# Patient Record
Sex: Female | Born: 1935
Health system: Southern US, Community
[De-identification: ages and names within clinical notes are randomized; demographics above are authoritative.]

## PROBLEM LIST (undated history)

## (undated) DIAGNOSIS — E079 Disorder of thyroid, unspecified: Secondary | ICD-10-CM

## (undated) DIAGNOSIS — S4291XA Fracture of right shoulder girdle, part unspecified, initial encounter for closed fracture: Secondary | ICD-10-CM

## (undated) DIAGNOSIS — I10 Essential (primary) hypertension: Secondary | ICD-10-CM

---

## 2008-02-19 ENCOUNTER — Ambulatory Visit (HOSPITAL_BASED_OUTPATIENT_CLINIC_OR_DEPARTMENT_OTHER): Admission: RE | Admit: 2008-02-19 | Discharge: 2008-02-19 | Payer: Self-pay | Admitting: Internal Medicine

## 2013-02-23 ENCOUNTER — Emergency Department (HOSPITAL_BASED_OUTPATIENT_CLINIC_OR_DEPARTMENT_OTHER)
Admission: EM | Admit: 2013-02-23 | Discharge: 2013-02-23 | Disposition: A | Discharge status: Home / Self Care | Payer: Medicare Other | Attending: Emergency Medicine | Admitting: Emergency Medicine

## 2013-02-23 DIAGNOSIS — Z464 Encounter for fitting and adjustment of orthodontic device: Secondary | ICD-10-CM | POA: Insufficient documentation

## 2013-02-23 DIAGNOSIS — M542 Cervicalgia: Secondary | ICD-10-CM | POA: Insufficient documentation

## 2013-02-23 DIAGNOSIS — Z4789 Encounter for other orthopedic aftercare: Secondary | ICD-10-CM

## 2013-02-23 NOTE — ED Provider Notes (Signed)
CSN: [id number]     Arrival date & time 02/23/13  1746 History  This chart was scribed for Gwyneth Sprout, MD by Ardelia Mems and Joaquin Music, ED Scribes. This patient was seen in room Room/bed info not found and the patient's care was started at 6:03 PM.  No chief complaint on file.   The history is provided by the patient. No language interpreter was used.   HPI Comments: Stacy Huang is a 79 y.o. female who presents to the Emergency Department complaining of continued right arm pain after a recent fracture. Pt has a cast placed to her right lower arm, and state that she is having pain due to the cast being "too heavy and too tight". Pt has a sling round her neck which she states is causing her additional discomfort. She reports associated mild neck pain, She denies any other pain or symptoms.  No past medical history on file. No past surgical history on file. No family history on file.  History  Substance Use Topics  . Smoking status: Not on file  . Smokeless tobacco: Not on file  . Alcohol Use: Not on file   OB History   No data available     Review of Systems  HENT: Positive for neck pain.   Musculoskeletal:       Right arm pain.  All other systems reviewed and are negative.    Allergies  Review of patient's allergies indicates not on file.  Home Medications  No current outpatient prescriptions on file.  There were no vitals taken for this visit.  Physical Exam  Nursing note and vitals reviewed. Constitutional: She is oriented to person, place, and time. She appears well-developed and well-nourished. No distress.  HENT:  Head: Normocephalic and atraumatic.  Eyes: EOM are normal.  Neck: Neck supple. No tracheal deviation present.  Cardiovascular: Normal rate.   2+ radial pulse on the right. Capillary refill less than 2 seconds.  Pulmonary/Chest: Effort normal. No respiratory distress.  Neurological: She is alert and oriented to person, place,  and time.  Cast looks appropriately sized without any signs of neurovascular compromise.  Skin: Skin is warm and dry.  Psychiatric: She has a normal mood and affect. Her behavior is normal.    ED Course  Procedures  DIAGNOSTIC STUDIES:  COORDINATION OF CARE: 6:09 PM- Pt advised of plan for treatment. Pt verbalizes understanding and agreement with plan.  Labs Review Labs Reviewed - No data to display Imaging Review No results found.  MDM   1. Cast discomfort    Pt needed to be placed in a shoulder immobilizer with significant improval in discomfort.   I personally performed the services described in this documentation, which was scribed in my presence.  The recorded information has been reviewed and considered.    Gwyneth Sprout, MD 02/23/13 [id number]

## 2013-06-06 ENCOUNTER — Encounter (HOSPITAL_BASED_OUTPATIENT_CLINIC_OR_DEPARTMENT_OTHER): Payer: Self-pay | Admitting: Emergency Medicine

## 2013-06-06 ENCOUNTER — Emergency Department (HOSPITAL_BASED_OUTPATIENT_CLINIC_OR_DEPARTMENT_OTHER)
Admission: EM | Admit: 2013-06-06 | Discharge: 2013-06-06 | Disposition: A | Discharge status: Home / Self Care | Payer: Medicare Other | Attending: Emergency Medicine | Admitting: Emergency Medicine

## 2013-06-06 DIAGNOSIS — R5381 Other malaise: Secondary | ICD-10-CM | POA: Insufficient documentation

## 2013-06-06 DIAGNOSIS — E079 Disorder of thyroid, unspecified: Secondary | ICD-10-CM | POA: Insufficient documentation

## 2013-06-06 DIAGNOSIS — J069 Acute upper respiratory infection, unspecified: Secondary | ICD-10-CM

## 2013-06-06 DIAGNOSIS — Z8781 Personal history of (healed) traumatic fracture: Secondary | ICD-10-CM | POA: Insufficient documentation

## 2013-06-06 DIAGNOSIS — Z79899 Other long term (current) drug therapy: Secondary | ICD-10-CM | POA: Insufficient documentation

## 2013-06-06 DIAGNOSIS — H6120 Impacted cerumen, unspecified ear: Secondary | ICD-10-CM | POA: Insufficient documentation

## 2013-06-06 DIAGNOSIS — I10 Essential (primary) hypertension: Secondary | ICD-10-CM | POA: Insufficient documentation

## 2013-06-06 DIAGNOSIS — H9209 Otalgia, unspecified ear: Secondary | ICD-10-CM | POA: Insufficient documentation

## 2013-06-06 DIAGNOSIS — H6123 Impacted cerumen, bilateral: Secondary | ICD-10-CM

## 2013-06-06 DIAGNOSIS — R42 Dizziness and giddiness: Secondary | ICD-10-CM | POA: Insufficient documentation

## 2013-06-06 HISTORY — DX: Essential (primary) hypertension: I10

## 2013-06-06 HISTORY — DX: Disorder of thyroid, unspecified: E07.9

## 2013-06-06 HISTORY — DX: Fracture of right shoulder girdle, part unspecified, initial encounter for closed fracture: S42.91XA

## 2013-06-06 MED ORDER — DOCUSATE SODIUM 50 MG/5ML PO LIQD
ORAL | Status: AC
  Administered 2013-06-06: 13:00:00
  Filled 2013-06-06: qty 10

## 2013-06-06 MED ORDER — AZITHROMYCIN 250 MG PO TABS: ORAL_TABLET | ORAL | Status: DC

## 2013-06-06 NOTE — ED Provider Notes (Signed)
CSN: [id number]     Arrival date & time 06/06/13  1015 History   First MD Initiated Contact with Patient 06/06/13 1030     Chief Complaint  Patient presents with  . URI   (Consider location/radiation/quality/duration/timing/severity/associated sxs/prior Treatment) HPI Comments: Patient is a 79 year old female with history of hypertension and thyroid disease. She presents today with complaints of sinus congestion, ear discomfort, and dizziness that has been occurring intermittently over the past week. She states that this happens on occasion when her years become plugged up. She denies headaches, stiff neck, or high fever.  Patient is a 79 y.o. female presenting with URI. The history is provided by the patient.  URI Presenting symptoms: congestion, cough, ear pain and fatigue   Presenting symptoms: no fever   Severity:  Moderate Duration:  1 week Timing:  Constant Progression:  Worsening Chronicity:  New Relieved by:  Nothing Worsened by:  Nothing tried Ineffective treatments:  None tried   Past Medical History  Diagnosis Date  . Hypertension   . Thyroid disease   . Shoulder fracture, right    History reviewed. No pertinent past surgical history. No family history on file. History  Substance Use Topics  . Smoking status: Never Smoker   . Smokeless tobacco: Not on file  . Alcohol Use: No   OB History   Grav Para Term Preterm Abortions TAB SAB Ect Mult Living                 Review of Systems  Constitutional: Positive for fatigue. Negative for fever.  HENT: Positive for congestion and ear pain.   Respiratory: Positive for cough.   All other systems reviewed and are negative.    Allergies  Review of patient's allergies indicates no known allergies.  Home Medications   Current Outpatient Rx  Name  Route  Sig  Dispense  Refill  . Levothyroxine Sodium (SYNTHROID PO)   Oral   Take by mouth.         Marland Kitchen UNKNOWN TO PATIENT      Blood pressure med           BP 166/56  Pulse 85  Temp(Src) 98.5 F (36.9 C) (Oral)  Resp 18  Ht 5\' 2"  (1.575 m)  Wt 162 lb (73.483 kg)  BMI 29.62 kg/m2  SpO2 98% Physical Exam  Nursing note and vitals reviewed. Constitutional: She is oriented to person, place, and time. She appears well-developed and well-nourished. No distress.  HENT:  Head: Normocephalic and atraumatic.  Mouth/Throat: Oropharynx is clear and moist.  Bilateral TMs are unable to be visualized do to cerumen.  Neck: Normal range of motion. Neck supple.  Cardiovascular: Normal rate and regular rhythm.  Exam reveals no gallop and no friction rub.   No murmur heard. Pulmonary/Chest: Effort normal and breath sounds normal. No respiratory distress. She has no wheezes.  Abdominal: Soft. Bowel sounds are normal. She exhibits no distension. There is no tenderness.  Musculoskeletal: Normal range of motion.  Lymphadenopathy:    She has no cervical adenopathy.  Neurological: She is alert and oriented to person, place, and time.  Skin: Skin is warm and dry. She is not diaphoretic.    ED Course  Procedures (including critical care time) Labs Review Labs Reviewed - No data to display Imaging Review No results found.  EKG Interpretation   None       MDM  No diagnosis found. Patient is a 79 year old female presents with ear congestion and sinus congestion  for the past several days. She has bilateral cerumen impactions were irrigated and she is now feeling better. Chills reports sinus pressure and drainage for which I will prescribe an antibiotic. She is to followup if she develops any problems. She otherwise looks clinically well and her vital signs are stable. I do not feel as though any imaging or laboratory studies are indicated at this time.    Geoffery Lyons, MD 06/06/13 1239

## 2013-06-06 NOTE — ED Notes (Signed)
Patient c/o productive cough/runny nose/sinus congestion/earache/ /intermittentdizziness over the past week. Last tylenol take yesterday

## 2015-10-03 ENCOUNTER — Emergency Department (HOSPITAL_BASED_OUTPATIENT_CLINIC_OR_DEPARTMENT_OTHER)
Admission: EM | Admit: 2015-10-03 | Discharge: 2015-10-03 | Disposition: A | Discharge status: Home / Self Care | Payer: Medicare Other | Attending: Emergency Medicine | Admitting: Emergency Medicine

## 2015-10-03 ENCOUNTER — Encounter (HOSPITAL_BASED_OUTPATIENT_CLINIC_OR_DEPARTMENT_OTHER): Payer: Self-pay | Admitting: *Deleted

## 2015-10-03 ENCOUNTER — Emergency Department (HOSPITAL_BASED_OUTPATIENT_CLINIC_OR_DEPARTMENT_OTHER): Payer: Medicare Other

## 2015-10-03 DIAGNOSIS — Y998 Other external cause status: Secondary | ICD-10-CM | POA: Insufficient documentation

## 2015-10-03 DIAGNOSIS — S8992XA Unspecified injury of left lower leg, initial encounter: Secondary | ICD-10-CM | POA: Diagnosis present

## 2015-10-03 DIAGNOSIS — I10 Essential (primary) hypertension: Secondary | ICD-10-CM | POA: Diagnosis not present

## 2015-10-03 DIAGNOSIS — Y9289 Other specified places as the place of occurrence of the external cause: Secondary | ICD-10-CM | POA: Insufficient documentation

## 2015-10-03 DIAGNOSIS — S86212A Strain of muscle(s) and tendon(s) of anterior muscle group at lower leg level, left leg, initial encounter: Secondary | ICD-10-CM | POA: Diagnosis not present

## 2015-10-03 DIAGNOSIS — E079 Disorder of thyroid, unspecified: Secondary | ICD-10-CM | POA: Insufficient documentation

## 2015-10-03 DIAGNOSIS — S8392XA Sprain of unspecified site of left knee, initial encounter: Secondary | ICD-10-CM | POA: Insufficient documentation

## 2015-10-03 DIAGNOSIS — Y9389 Activity, other specified: Secondary | ICD-10-CM | POA: Insufficient documentation

## 2015-10-03 DIAGNOSIS — X501XXA Overexertion from prolonged static or awkward postures, initial encounter: Secondary | ICD-10-CM | POA: Insufficient documentation

## 2015-10-03 MED ORDER — LIDOCAINE 5 % EX PTCH: 1.0000 | MEDICATED_PATCH | CUTANEOUS | Status: DC

## 2015-10-03 NOTE — ED Notes (Signed)
She twisted her right knee while turning 3 days ago. Painful. She is walking with a cane which is not her norm.

## 2015-10-03 NOTE — ED Provider Notes (Addendum)
CSN: [id number]     Arrival date & time 10/03/15  1109 History   First MD Initiated Contact with Patient 10/03/15 1115     Chief Complaint  Patient presents with  . Knee Injury     (Consider location/radiation/quality/duration/timing/severity/associated sxs/prior Treatment) Patient is a 82 y.o. female presenting with knee pain. The history is provided by the patient.  Knee Pain Location:  Knee Time since incident:  3 days Injury: yes   Mechanism of injury comment:  She was putting on appear patent at its when her right knee twisted Knee location:  R knee Pain details:    Quality:  Aching and shooting   Radiates to:  R leg   Severity:  Moderate   Onset quality:  Sudden   Duration:  3 days   Timing:  Constant   Progression:  Worsening Chronicity:  New Prior injury to area:  No Relieved by:  Acetaminophen Worsened by:  Bearing weight Ineffective treatments:  None tried Associated symptoms: no back pain, no decreased ROM, no muscle weakness and no swelling   Risk factors comment:  Known arthritis   Past Medical History  Diagnosis Date  . Hypertension   . Thyroid disease   . Shoulder fracture, right    History reviewed. No pertinent past surgical history. No family history on file. Social History  Substance Use Topics  . Smoking status: Never Smoker   . Smokeless tobacco: None  . Alcohol Use: No   OB History    No data available     Review of Systems  Musculoskeletal: Negative for back pain.  All other systems reviewed and are negative.     Allergies  Review of patient's allergies indicates no known allergies.  Home Medications   Prior to Admission medications   Medication Sig Start Date End Date Taking? Authorizing Provider  azithromycin (ZITHROMAX Z-PAK) 250 MG tablet 2 po day one, then 1 daily x 4 days 06/06/13   Geoffery Lyons, MD  Levothyroxine Sodium (SYNTHROID PO) Take by mouth.    Historical Provider, MD  UNKNOWN TO PATIENT Blood pressure med     Historical Provider, MD   BP 159/93 mmHg  Pulse 72  Temp(Src) 98 F (36.7 C) (Oral)  Resp 16  Ht  (1.575 m)  Wt 132 lb (59.875 kg)  BMI 24.14 kg/m2  SpO2 100% Physical Exam  Constitutional: She is oriented to person, place, and time. She appears well-developed and well-nourished. No distress.  HENT:  Head: Normocephalic and atraumatic.  Cardiovascular: Normal rate.   Pulmonary/Chest: Effort normal.  Musculoskeletal: She exhibits tenderness.       Right knee: She exhibits normal range of motion, no swelling and no effusion. Tenderness found. Lateral joint line tenderness noted. No MCL and no LCL tenderness noted.       Legs: Neurological: She is alert and oriented to person, place, and time.  Skin: Skin is warm and dry.  Psychiatric: She has a normal mood and affect. Her behavior is normal.  Nursing note and vitals reviewed.   ED Course  Procedures (including critical care time) Labs Review Labs Reviewed - No data to display  Imaging Review Dg Knee Complete 4 Views Right  10/03/2015  CLINICAL DATA:  Twisted right knee 3 days ago. Right lateral knee pain. EXAM: RIGHT KNEE - COMPLETE 4+ VIEW COMPARISON:  None. FINDINGS: Degenerative changes within the right knee with joint space narrowing and spurring. Small joint effusion. No acute bony abnormality. Specifically, no fracture, subluxation, or  dislocation. Soft tissues are intact. IMPRESSION: Tricompartment degenerative changes. Small joint effusion. No acute bony abnormality. Electronically Signed   By: Charlett NoseKevin  Dover M.D.   On: 10/03/2015 12:01   I have personally reviewed and evaluated these images and lab results as part of my medical decision-making.   EKG Interpretation None      MDM   Final diagnoses:  Knee sprain and strain, left, initial encounter    Patient is a 82 year old female presenting today with knee pain that started on Friday when she twisted her knee and felt a pop. Full range of motion intact with  pain over the lateral meniscus without significant swelling present. Neurovascularly intact. Patient is able to ambulate but has to use a cane due to pain. She has been using Tylenol with some improvement. At this time will prescribe Lidoderm patches, Ace wrap and follow-up with ortho. Imaging showed tricompartment degeneration, small joint effusion without other acute issues.  Gwyneth SproutWhitney Yaakov Saindon, MD 10/03/15 1223  Gwyneth SproutWhitney Daven Pinckney, MD 10/03/15 1226

## 2019-11-10 ENCOUNTER — Other Ambulatory Visit: Payer: Self-pay

## 2019-11-10 ENCOUNTER — Encounter (HOSPITAL_BASED_OUTPATIENT_CLINIC_OR_DEPARTMENT_OTHER): Payer: Self-pay | Admitting: *Deleted

## 2019-11-10 ENCOUNTER — Emergency Department (HOSPITAL_BASED_OUTPATIENT_CLINIC_OR_DEPARTMENT_OTHER)
Admission: EM | Admit: 2019-11-10 | Discharge: 2019-11-10 | Disposition: A | Discharge status: Home / Self Care | Payer: Medicare Other | Attending: Emergency Medicine | Admitting: Emergency Medicine

## 2019-11-10 DIAGNOSIS — H6123 Impacted cerumen, bilateral: Secondary | ICD-10-CM | POA: Insufficient documentation

## 2019-11-10 DIAGNOSIS — Z79899 Other long term (current) drug therapy: Secondary | ICD-10-CM | POA: Insufficient documentation

## 2019-11-10 DIAGNOSIS — E079 Disorder of thyroid, unspecified: Secondary | ICD-10-CM | POA: Insufficient documentation

## 2019-11-10 DIAGNOSIS — I10 Essential (primary) hypertension: Secondary | ICD-10-CM | POA: Diagnosis not present

## 2019-11-10 DIAGNOSIS — H9203 Otalgia, bilateral: Secondary | ICD-10-CM | POA: Diagnosis present

## 2019-11-10 MED ORDER — CARBAMIDE PEROXIDE 6.5 % OT SOLN: 5.0000 [drp] | Freq: Two times a day (BID) | OTIC | 0 refills | Status: DC

## 2019-11-10 MED FILL — DEBROX 6.5 % SOLN: 6.5 | 15 days supply | Qty: 15 | Fill #0

## 2019-11-10 NOTE — ED Notes (Signed)
ED Provider at bedside. 

## 2019-11-10 NOTE — ED Triage Notes (Addendum)
Headache and pain in both ears x 3 days. Her son states ear wax in her ears has caused her to become dizzy.

## 2019-11-10 NOTE — Discharge Instructions (Addendum)
Use Debrox as prescribed.  If this is too expensive see if there are any other over-the-counter medications that you can use.  You could always try 50-50 mix of water and hydrogen peroxide 1-2 times a day with irrigation of the ear with water shortly after using.  However, follow-up with your primary care doctor if still having some hearing issues.  They may be able to help out or refer you to ENT.

## 2019-11-10 NOTE — ED Provider Notes (Signed)
[facility] EMERGENCY DEPARTMENT Provider Note   CSN: [id number] Arrival date & time: 11/10/19  1131     History Chief Complaint  Patient presents with   Otalgia    Stacy Huang is a 86 y.o. female.  The history is provided by the patient.  Otalgia Location:  Bilateral Behind ear:  No abnormality Quality:  Aching Severity:  Mild Onset quality:  Gradual Duration:  3 days Timing:  Constant Progression:  Unchanged Chronicity:  New Context comment:  Ear wax Relieved by:  OTC medications Worsened by:  Nothing Associated symptoms: hearing loss (diminshed)   Associated symptoms: no abdominal pain, no congestion, no cough, no diarrhea, no ear discharge, no fever, no headaches, no neck pain, no rash, no rhinorrhea, no sore throat, no tinnitus and no vomiting        Past Medical History:  Diagnosis Date   Hypertension    Shoulder fracture, right    Thyroid disease     There are no problems to display for this patient.   History reviewed. No pertinent surgical history.   OB History   No obstetric history on file.     No family history on file.  Social History   Tobacco Use   Smoking status: Never Smoker   Smokeless tobacco: Never Used  Substance Use Topics   Alcohol use: No   Drug use: No    Home Medications Prior to Admission medications   Medication Sig Start Date End Date Taking? Authorizing Provider  Levothyroxine Sodium (SYNTHROID PO) Take by mouth.   Yes [provider]  azithromycin (ZITHROMAX Z-PAK) 250 MG tablet 2 po day one, then 1 daily x 4 days 06/06/13   Geoffery Lyons, MD  carbamide peroxide (DEBROX) 6.5 % OTIC solution Place 5 drops into both ears 2 (two) times daily. 11/10/19   Vedant Shehadeh, DO  lidocaine (LIDODERM) 5 % Place 1 patch onto the skin daily. Remove & Discard patch within 12 hours or as directed by MD 10/03/15   Gwyneth Sprout, MD  UNKNOWN TO PATIENT Blood pressure med    [provider]     Allergies    Patient has no known allergies.  Review of Systems   Review of Systems  Constitutional: Negative for fever.  HENT: Positive for ear pain and hearing loss (diminshed). Negative for congestion, dental problem, ear discharge, facial swelling, mouth sores, nosebleeds, postnasal drip, rhinorrhea, sinus pressure, sinus pain, sneezing, sore throat, tinnitus and trouble swallowing.   Respiratory: Negative for cough.   Gastrointestinal: Negative for abdominal pain, diarrhea and vomiting.  Musculoskeletal: Negative for neck pain.  Skin: Negative for rash.  Neurological: Positive for dizziness (chronic, intermittent, non currently). Negative for tremors, seizures, syncope, facial asymmetry, speech difficulty, weakness, light-headedness, numbness and headaches.    Physical Exam Updated Vital Signs  ED Triage Vitals  Enc Vitals Group     BP 11/10/19 1141 (!) 218/78     Pulse Rate 11/10/19 1141 78     Resp 11/10/19 1141 18     Temp 11/10/19 1141 98.7 F (37.1 C)     Temp Source 11/10/19 1141 Oral     SpO2 11/10/19 1141 100 %     Weight 11/10/19 1137 153 lb (69.4 kg)     Height 11/10/19 1137 5\' 2"  (1.575 m)     Head Circumference --      Peak Flow --      Pain Score 11/10/19 1137 3     Pain Loc --  Pain Edu? --      Excl. in GC? --     Physical Exam Vitals and nursing note reviewed.  Constitutional:      General: She is not in acute distress.    Appearance: She is well-developed. She is not ill-appearing.  HENT:     Head: Normocephalic and atraumatic.     Right Ear: There is impacted cerumen.     Left Ear: There is impacted cerumen.  Eyes:     Conjunctiva/sclera: Conjunctivae normal.  Cardiovascular:     Rate and Rhythm: Normal rate and regular rhythm.     Pulses: Normal pulses.     Heart sounds: Normal heart sounds. No murmur.  Pulmonary:     Effort: Pulmonary effort is normal. No respiratory distress.     Breath sounds: Normal breath sounds.  Abdominal:      Palpations: Abdomen is soft.     Tenderness: There is no abdominal tenderness.  Musculoskeletal:     Cervical back: Normal range of motion and neck supple.  Skin:    General: Skin is warm and dry.  Neurological:     General: No focal deficit present.     Mental Status: She is alert and oriented to person, place, and time.     Cranial Nerves: No cranial nerve deficit.     Sensory: No sensory deficit.     Motor: No weakness.     Coordination: Coordination normal.     Gait: Gait normal.     Comments: 5+ out of 5 strength throughout, normal sensation, no drift, normal speech, no visual field deficit, normal finger-to-nose finger     ED Results / Procedures / Treatments   Labs (all labs ordered are listed, but only abnormal results are displayed) Labs Reviewed - No data to display  EKG EKG Interpretation  Date/Time:  Tuesday November 10 2019 11:49:03 EDT Ventricular Rate:  66 PR Interval:    QRS Duration: 81 QT Interval:  435 QTC Calculation: 456 R Axis:   55 Text Interpretation: Sinus rhythm Nonspecific T abnormalities, lateral leads Confirmed by Virgina Norfolk [phone]) on 11/10/2019 11:53:29 AM   Radiology No results found.  Procedures .Ear Cerumen Removal  Date/Time: 11/10/2019 12:20 PM Performed by: Virgina Norfolk, DO Authorized by: Virgina Norfolk, DO   Consent:    Consent obtained:  Verbal   Consent given by:  Patient   Risks discussed:  Infection, incomplete removal, bleeding, dizziness, pain and TM perforation   Alternatives discussed:  No treatment Procedure details:    Location:  L ear and R ear   Procedure type: irrigation   Post-procedure details:    Inspection:  TM intact   Hearing quality:  Improved   Patient tolerance of procedure:  Tolerated well, no immediate complications   (including critical care time)  Medications Ordered in ED Medications - No data to display  ED Course  I have reviewed the triage vital signs and the nursing  notes.  Pertinent labs & imaging results that were available during my care of the patient were reviewed by me and considered in my medical decision making (see chart for details).    MDM Rules/Calculators/A&P                      Stacy Huang is a 86 year old female with history of hypertension who presents to the ED with ear pain.  Patient with overall unremarkable vitals except for elevated blood pressure.  Patient with bilateral ear discomfort  for the last several days.  Has been using over-the-counter sweet oil for her earwax.  She has diminished hearing.  Has some intermittent dizziness but that is chronic.  But does get worse when her ears get full of wax.  She has a normal neurological exam.  No concern for stroke or other intracranial process.  She has bilateral impaction of ears with wax.  I was able to irrigate out the ears with a 50-50 mix of water and hydrogen peroxide with some improvement of her hearing and some diminished wax.  TMs overall appear well.  Will prescribe Debrox and have her follow-up with her primary care doctor.  May need to see ENT which she has had in the past for ear washouts.  Discharged in ED in good condition.  Given return precautions.  This chart was dictated using voice recognition software.  Despite best efforts to proofread,  errors can occur which can change the documentation meaning.    Final Clinical Impression(s) / ED Diagnoses Final diagnoses:  Bilateral impacted cerumen    Rx / DC Orders ED Discharge Orders         Ordered    carbamide peroxide (DEBROX) 6.5 % OTIC solution  2 times daily     11/10/19 1224           Salima Rumer, DO 11/10/19 1226

## 2020-02-26 ENCOUNTER — Inpatient Hospital Stay (HOSPITAL_BASED_OUTPATIENT_CLINIC_OR_DEPARTMENT_OTHER)
Admission: EM | Admit: 2020-02-26 | Discharge: 2020-03-02 | DRG: 177 | Disposition: A | Discharge status: Skilled Nursing Facility | Payer: Medicare Other | Attending: Internal Medicine | Admitting: Internal Medicine

## 2020-02-26 ENCOUNTER — Encounter (HOSPITAL_BASED_OUTPATIENT_CLINIC_OR_DEPARTMENT_OTHER): Payer: Self-pay | Admitting: *Deleted

## 2020-02-26 ENCOUNTER — Emergency Department (HOSPITAL_BASED_OUTPATIENT_CLINIC_OR_DEPARTMENT_OTHER): Payer: Medicare Other

## 2020-02-26 ENCOUNTER — Other Ambulatory Visit: Payer: Self-pay

## 2020-02-26 DIAGNOSIS — I82453 Acute embolism and thrombosis of peroneal vein, bilateral: Secondary | ICD-10-CM | POA: Diagnosis present

## 2020-02-26 DIAGNOSIS — U071 COVID-19: Principal | ICD-10-CM | POA: Diagnosis present

## 2020-02-26 DIAGNOSIS — I160 Hypertensive urgency: Secondary | ICD-10-CM | POA: Diagnosis present

## 2020-02-26 DIAGNOSIS — I82441 Acute embolism and thrombosis of right tibial vein: Secondary | ICD-10-CM | POA: Diagnosis present

## 2020-02-26 DIAGNOSIS — I82811 Embolism and thrombosis of superficial veins of right lower extremities: Secondary | ICD-10-CM | POA: Diagnosis present

## 2020-02-26 DIAGNOSIS — R7989 Other specified abnormal findings of blood chemistry: Secondary | ICD-10-CM

## 2020-02-26 DIAGNOSIS — Z79890 Hormone replacement therapy (postmenopausal): Secondary | ICD-10-CM

## 2020-02-26 DIAGNOSIS — J1282 Pneumonia due to coronavirus disease 2019: Secondary | ICD-10-CM | POA: Diagnosis present

## 2020-02-26 DIAGNOSIS — J9601 Acute respiratory failure with hypoxia: Secondary | ICD-10-CM | POA: Diagnosis not present

## 2020-02-26 DIAGNOSIS — Z66 Do not resuscitate: Secondary | ICD-10-CM | POA: Diagnosis present

## 2020-02-26 DIAGNOSIS — R531 Weakness: Secondary | ICD-10-CM

## 2020-02-26 DIAGNOSIS — R5383 Other fatigue: Secondary | ICD-10-CM

## 2020-02-26 DIAGNOSIS — N183 Chronic kidney disease, stage 3 unspecified: Secondary | ICD-10-CM | POA: Diagnosis present

## 2020-02-26 DIAGNOSIS — E039 Hypothyroidism, unspecified: Secondary | ICD-10-CM | POA: Diagnosis present

## 2020-02-26 DIAGNOSIS — N1832 Chronic kidney disease, stage 3b: Secondary | ICD-10-CM

## 2020-02-26 DIAGNOSIS — I129 Hypertensive chronic kidney disease with stage 1 through stage 4 chronic kidney disease, or unspecified chronic kidney disease: Secondary | ICD-10-CM | POA: Diagnosis present

## 2020-02-26 LAB — FERRITIN: Ferritin: 468 ng/mL — ABNORMAL HIGH (ref 11–307)

## 2020-02-26 LAB — PROCALCITONIN: Procalcitonin: 0.13 ng/mL

## 2020-02-26 LAB — CBC WITH DIFFERENTIAL/PLATELET
Abs Immature Granulocytes: 0.61 10*3/uL — ABNORMAL HIGH (ref 0.00–0.07)
Basophils Absolute: 0.1 10*3/uL (ref 0.0–0.1)
Basophils Relative: 0 %
Eosinophils Absolute: 0.1 10*3/uL (ref 0.0–0.5)
Eosinophils Relative: 1 %
HCT: 38.9 % (ref 36.0–46.0)
Hemoglobin: 12.6 g/dL (ref 12.0–15.0)
Immature Granulocytes: 4 %
Lymphocytes Relative: 4 %
Lymphs Abs: 0.7 10*3/uL (ref 0.7–4.0)
MCH: 29.6 pg (ref 26.0–34.0)
MCHC: 32.4 g/dL (ref 30.0–36.0)
MCV: 91.5 fL (ref 80.0–100.0)
Monocytes Absolute: 0.8 10*3/uL (ref 0.1–1.0)
Monocytes Relative: 5 %
Neutro Abs: 14.2 10*3/uL — ABNORMAL HIGH (ref 1.7–7.7)
Neutrophils Relative %: 86 %
Platelets: 377 10*3/uL (ref 150–400)
RBC: 4.25 MIL/uL (ref 3.87–5.11)
RDW: 13.2 % (ref 11.5–15.5)
WBC: 16.4 10*3/uL — ABNORMAL HIGH (ref 4.0–10.5)
nRBC: 0 % (ref 0.0–0.2)

## 2020-02-26 LAB — COMPREHENSIVE METABOLIC PANEL
ALT: 13 U/L (ref 0–44)
AST: 20 U/L (ref 15–41)
Albumin: 2.9 g/dL — ABNORMAL LOW (ref 3.5–5.0)
Alkaline Phosphatase: 74 U/L (ref 38–126)
Anion gap: 13 (ref 5–15)
BUN: 25 mg/dL — ABNORMAL HIGH (ref 8–23)
CO2: 23 mmol/L (ref 22–32)
Calcium: 8.6 mg/dL — ABNORMAL LOW (ref 8.9–10.3)
Chloride: 103 mmol/L (ref 98–111)
Creatinine, Ser: 1.62 mg/dL — ABNORMAL HIGH (ref 0.44–1.00)
GFR calc Af Amer: 31 mL/min — ABNORMAL LOW (ref >60)
GFR calc non Af Amer: 26 mL/min — ABNORMAL LOW (ref >60)
Glucose, Bld: 111 mg/dL — ABNORMAL HIGH (ref 70–99)
Potassium: 3.8 mmol/L (ref 3.5–5.1)
Sodium: 139 mmol/L (ref 135–145)
Total Bilirubin: 0.6 mg/dL (ref 0.3–1.2)
Total Protein: 7.6 g/dL (ref 6.5–8.1)

## 2020-02-26 LAB — FIBRINOGEN: Fibrinogen: 518 mg/dL — ABNORMAL HIGH (ref 210–475)

## 2020-02-26 LAB — D-DIMER, QUANTITATIVE: D-Dimer, Quant: >20 ug/mL-FEU — ABNORMAL HIGH (ref 0.00–0.50)

## 2020-02-26 LAB — SARS CORONAVIRUS 2 BY RT PCR (HOSPITAL ORDER, PERFORMED IN ~~LOC~~ HOSPITAL LAB): SARS Coronavirus 2: POSITIVE — AB

## 2020-02-26 LAB — TRIGLYCERIDES: Triglycerides: 146 mg/dL (ref <150)

## 2020-02-26 LAB — LACTATE DEHYDROGENASE: LDH: 234 U/L — ABNORMAL HIGH (ref 98–192)

## 2020-02-26 LAB — LACTIC ACID, PLASMA: Lactic Acid, Venous: 1.3 mmol/L (ref 0.5–1.9)

## 2020-02-26 LAB — C-REACTIVE PROTEIN: CRP: 15.3 mg/dL — ABNORMAL HIGH (ref <1.0)

## 2020-02-26 MED ORDER — SODIUM CHLORIDE 0.9 % IV SOLN
100.0000 mg | Freq: Every day | INTRAVENOUS | Status: DC
  Administered 2020-02-27: 100 mg via INTRAVENOUS
  Filled 2020-02-26 (×2): qty 20

## 2020-02-26 MED ORDER — DEXAMETHASONE SODIUM PHOSPHATE 10 MG/ML IJ SOLN
10.0000 mg | Freq: Once | INTRAMUSCULAR | Status: AC
  Administered 2020-02-26: 10 mg via INTRAVENOUS
  Filled 2020-02-26: qty 1

## 2020-02-26 MED ORDER — SODIUM CHLORIDE 0.9 % IV SOLN
200.0000 mg | Freq: Once | INTRAVENOUS | Status: AC
  Administered 2020-02-26: 200 mg via INTRAVENOUS
  Filled 2020-02-26: qty 40

## 2020-02-26 NOTE — ED Provider Notes (Signed)
[facility] EMERGENCY DEPARTMENT Provider Note  CSN: [id number] Arrival date & time: 02/26/20 1445    History Chief Complaint  Patient presents with  . Weakness  . Dizziness    HPI  Stacy Huang is a 86 y.o. female who still lives relatively independently with family reports she has had 2 weeks of dry cough, general weakness and SOB with walking. She does not think she has had a fever. She gets dizzy at times. She also has told her family that her food tastes funny at times. She has been vaccinated for Covid.    Past Medical History:  Diagnosis Date  . Hypertension   . Shoulder fracture, right   . Thyroid disease     History reviewed. No pertinent surgical history.  No family history on file.  Social History   Tobacco Use  . Smoking status: Never Smoker  . Smokeless tobacco: Never Used  Substance Use Topics  . Alcohol use: No  . Drug use: No     Home Medications Prior to Admission medications   Medication Sig Start Date End Date Taking? Authorizing Provider  Levothyroxine Sodium (SYNTHROID PO) Take by mouth.   Yes [provider]  UNKNOWN TO PATIENT Blood pressure med   Yes [provider]  azithromycin (ZITHROMAX Z-PAK) 250 MG tablet 2 po day one, then 1 daily x 4 days 06/06/13   Geoffery Lyons, MD  carbamide peroxide (DEBROX) 6.5 % OTIC solution Place 5 drops into both ears 2 (two) times daily. 11/10/19   Curatolo, Adam, DO  lidocaine (LIDODERM) 5 % Place 1 patch onto the skin daily. Remove & Discard patch within 12 hours or as directed by MD 10/03/15   Gwyneth Sprout, MD     Allergies    Patient has no known allergies.   Review of Systems   Review of Systems A comprehensive review of systems was completed and negative except as noted in HPI.    Physical Exam BP (!) 194/69   Pulse 66   Temp 99.2 F (37.3 C) (Oral)   Resp (!) 26   Ht 5\' 2"  (1.575 m)   Wt 69.4 kg   SpO2 100%   BMI 27.98 kg/m   Physical  Exam Vitals and nursing note reviewed.  Constitutional:      Appearance: Normal appearance.  HENT:     Head: Normocephalic and atraumatic.     Nose: Nose normal.     Mouth/Throat:     Mouth: Mucous membranes are moist.  Eyes:     Extraocular Movements: Extraocular movements intact.     Conjunctiva/sclera: Conjunctivae normal.  Cardiovascular:     Rate and Rhythm: Normal rate.  Pulmonary:     Effort: Pulmonary effort is normal.     Breath sounds: Normal breath sounds.  Abdominal:     General: Abdomen is flat.     Palpations: Abdomen is soft.     Tenderness: There is no abdominal tenderness.  Musculoskeletal:        General: No swelling. Normal range of motion.     Cervical back: Neck supple.  Skin:    General: Skin is warm and dry.  Neurological:     General: No focal deficit present.     Mental Status: She is alert.  Psychiatric:        Mood and Affect: Mood normal.      ED Results / Procedures / Treatments   Labs (all labs ordered are listed, but only abnormal  results are displayed) Labs Reviewed  SARS CORONAVIRUS 2 BY RT PCR (HOSPITAL ORDER, PERFORMED IN [facility]) - Abnormal; Notable for the following components:      Result Value   SARS Coronavirus 2 POSITIVE (*)    All other components within normal limits  CBC WITH DIFFERENTIAL/PLATELET - Abnormal; Notable for the following components:   WBC 16.4 (*)    Neutro Abs 14.2 (*)    Abs Immature Granulocytes 0.61 (*)    All other components within normal limits  COMPREHENSIVE METABOLIC PANEL - Abnormal; Notable for the following components:   Glucose, Bld 111 (*)    BUN 25 (*)    Creatinine, Ser 1.62 (*)    Calcium 8.6 (*)    Albumin 2.9 (*)    GFR calc non Af Amer 26 (*)    GFR calc Af Amer 31 (*)    All other components within normal limits  FIBRINOGEN - Abnormal; Notable for the following components:   Fibrinogen 518 (*)    All other components within normal limits  CULTURE, BLOOD (ROUTINE  X 2)  CULTURE, BLOOD (ROUTINE X 2)  LACTIC ACID, PLASMA  PROCALCITONIN  LACTIC ACID, PLASMA  LACTATE DEHYDROGENASE  FERRITIN  TRIGLYCERIDES  C-REACTIVE PROTEIN  D-DIMER, QUANTITATIVE (NOT AT [facility])    EKG EKG Interpretation  Date/Time:  Friday February 26 2020 14:50:23 EDT Ventricular Rate:  88 PR Interval:  138 QRS Duration: 68 QT Interval:  384 QTC Calculation: 464 R Axis:   37 Text Interpretation: Normal sinus rhythm Anterior infarct , age undetermined Abnormal ECG No significant change since last tracing Confirmed by Susy Frizzle [phone]) on 02/26/2020 3:52:15 PM    Radiology DG Chest Portable 1 View  Result Date: 02/26/2020 CLINICAL DATA:  Weakness, oxygen saturation of 88% on room air EXAM: PORTABLE CHEST 1 VIEW COMPARISON:  CT 02/19/2008 FINDINGS: Mixed interstitial and more patchy basilar and peripheral airspace opacities bilaterally. No pneumothorax or visible effusion. The aorta is calcified. The remaining cardiomediastinal contours are unremarkable. No acute osseous or soft tissue abnormality. Likely geode formation of the left humeral head. Degenerative changes are present in the imaged spine and shoulders. Nasal cannula overlies the chest. IMPRESSION: Mixed interstitial and more patchy peripheral basilar and peripheral airspace opacities bilaterally. Findings are concerning for a multifocal infection including atypical infection including such as COVID 19. Otherwise, and mixed interstitial and alveolar edema could present similarly. Electronically Signed   By: Kreg Shropshire M.D.   On: 02/26/2020 15:19    Procedures .Critical Care Performed by: Pollyann Savoy, MD Authorized by: Pollyann Savoy, MD   Critical care provider statement:    Critical care time (minutes):  30   Critical care time was exclusive of:  Separately billable procedures and treating other patients   Critical care was time spent personally by me on the following activities:  Discussions  with consultants, evaluation of patient's response to treatment, examination of patient, ordering and performing treatments and interventions, ordering and review of laboratory studies, ordering and review of radiographic studies, pulse oximetry, re-evaluation of patient's condition, obtaining history from patient or surrogate and review of old charts    Medications Ordered in the ED Medications  dexamethasone (DECADRON) injection 10 mg (10 mg Intravenous Given 02/26/20 1804)     MDM Rules/Calculators/A&P MDM Patient noted to be hypoxic in triage, improved with 2L [place]. She has a CXR concerning for Covid despite being vaccinated. Will initiate Covid order panel.  ED Course  I  have reviewed the triage vital signs and the nursing notes.  Pertinent labs & imaging results that were available during my care of the patient were reviewed by me and considered in my medical decision making (see chart for details).  Clinical Course as of Feb 25 1853  Fri Feb 26, 2020  1646 CBC with mild leukocytosis.    [CS]  1706 CMP with mild increase in Cr from baseline 1.3 at most recent labs done in Epic.    [CS]  1755 Covid is confirmed positive. Given oxygen requirement, will discuss admission with hospitalist.    [CS]  1756 Stacy Huang was evaluated in Emergency Department on 02/26/2020 for the symptoms described in the history of present illness. She was evaluated in the context of the global COVID-19 pandemic, which necessitated consideration that the patient might be at risk for infection with the SARS-CoV-2 virus that causes COVID-19. Institutional protocols and algorithms that pertain to the evaluation of patients at risk for COVID-19 are in a state of rapid change based on information released by regulatory bodies including the CDC and federal and state organizations. These policies and algorithms were followed during the patient's care in the ED.     [CS]  1841 Spoke with Dr. Shawnie Pons, Hospitalist who  will admit the patient.    [CS]    Clinical Course User Index [CS] Pollyann Savoy, MD    Final Clinical Impression(s) / ED Diagnoses Final diagnoses:  COVID-19  Acute respiratory failure with hypoxia [facility])    Rx / DC Orders ED Discharge Orders    None       Pollyann Savoy, MD 02/26/20 [phone]

## 2020-02-26 NOTE — ED Notes (Signed)
Reports cough  And sob when she walks , she states her food  Taste funny at times

## 2020-02-26 NOTE — Progress Notes (Signed)
Patient ID: Stacy Huang, female   DOB: September 15, 1920, 86 y.o.   MRN: [id number] 86 year old with hypertension and thyroid disease 2-week history of not feeling well, loss of taste, cough.  Vaccinated for Covid.  Positive for Covid with abnormal x-ray findings, and hypoxia at 88% on room air.  She is currently satting 100% on 2 L.  She is hypertensive.  She has an elevated white count of 16.4.  A normal lactic acid 1.3 and acute kidney injury with a creatinine of 1.62.  She is accepted for transport to [facility] in a MedSurg bed.

## 2020-02-26 NOTE — ED Triage Notes (Signed)
Weakness and dizziness x 2 weeks.

## 2020-02-26 NOTE — ED Notes (Signed)
H/a x 2 weeks and dizzness x 1 month

## 2020-02-27 ENCOUNTER — Inpatient Hospital Stay (HOSPITAL_COMMUNITY): Payer: Medicare Other

## 2020-02-27 DIAGNOSIS — R531 Weakness: Secondary | ICD-10-CM

## 2020-02-27 DIAGNOSIS — I129 Hypertensive chronic kidney disease with stage 1 through stage 4 chronic kidney disease, or unspecified chronic kidney disease: Secondary | ICD-10-CM | POA: Diagnosis present

## 2020-02-27 DIAGNOSIS — J1282 Pneumonia due to coronavirus disease 2019: Secondary | ICD-10-CM | POA: Diagnosis present

## 2020-02-27 DIAGNOSIS — U071 COVID-19: Secondary | ICD-10-CM | POA: Diagnosis present

## 2020-02-27 DIAGNOSIS — J9601 Acute respiratory failure with hypoxia: Secondary | ICD-10-CM | POA: Diagnosis present

## 2020-02-27 DIAGNOSIS — E039 Hypothyroidism, unspecified: Secondary | ICD-10-CM | POA: Diagnosis present

## 2020-02-27 DIAGNOSIS — R7989 Other specified abnormal findings of blood chemistry: Secondary | ICD-10-CM

## 2020-02-27 DIAGNOSIS — N179 Acute kidney failure, unspecified: Secondary | ICD-10-CM | POA: Diagnosis not present

## 2020-02-27 DIAGNOSIS — I82811 Embolism and thrombosis of superficial veins of right lower extremities: Secondary | ICD-10-CM | POA: Diagnosis present

## 2020-02-27 DIAGNOSIS — N183 Chronic kidney disease, stage 3 unspecified: Secondary | ICD-10-CM | POA: Diagnosis present

## 2020-02-27 DIAGNOSIS — R5383 Other fatigue: Secondary | ICD-10-CM

## 2020-02-27 DIAGNOSIS — Z66 Do not resuscitate: Secondary | ICD-10-CM | POA: Diagnosis present

## 2020-02-27 DIAGNOSIS — I82441 Acute embolism and thrombosis of right tibial vein: Secondary | ICD-10-CM | POA: Diagnosis present

## 2020-02-27 DIAGNOSIS — I82453 Acute embolism and thrombosis of peroneal vein, bilateral: Secondary | ICD-10-CM | POA: Diagnosis present

## 2020-02-27 DIAGNOSIS — I160 Hypertensive urgency: Secondary | ICD-10-CM

## 2020-02-27 DIAGNOSIS — Z79890 Hormone replacement therapy (postmenopausal): Secondary | ICD-10-CM | POA: Diagnosis not present

## 2020-02-27 HISTORY — DX: Acute respiratory failure with hypoxia: J96.01

## 2020-02-27 HISTORY — DX: Hypertensive urgency: I16.0

## 2020-02-27 LAB — HEPARIN LEVEL (UNFRACTIONATED): Heparin Unfractionated: 0.51 IU/mL (ref 0.30–0.70)

## 2020-02-27 LAB — APTT: aPTT: 63 seconds — ABNORMAL HIGH (ref 24–36)

## 2020-02-27 LAB — PROTIME-INR
INR: 1.3 — ABNORMAL HIGH (ref 0.8–1.2)
Prothrombin Time: 15.3 seconds — ABNORMAL HIGH (ref 11.4–15.2)

## 2020-02-27 MED ORDER — ALBUTEROL SULFATE HFA 108 (90 BASE) MCG/ACT IN AERS
2.0000 | INHALATION_SPRAY | Freq: Four times a day (QID) | RESPIRATORY_TRACT | Status: DC
  Administered 2020-02-27 (×2): 2 via RESPIRATORY_TRACT
  Filled 2020-02-27: qty 6.7

## 2020-02-27 MED ORDER — SODIUM CHLORIDE 0.9 % IV SOLN: 200.0000 mg | Freq: Once | INTRAVENOUS | Status: DC

## 2020-02-27 MED ORDER — METHYLPREDNISOLONE SODIUM SUCC 40 MG IJ SOLR
0.5000 mg/kg | Freq: Two times a day (BID) | INTRAMUSCULAR | Status: AC
  Administered 2020-02-27 – 2020-03-01 (×6): 31.6 mg via INTRAVENOUS
  Filled 2020-02-27 (×6): qty 1

## 2020-02-27 MED ORDER — HYDRALAZINE HCL 20 MG/ML IJ SOLN
5.0000 mg | Freq: Once | INTRAMUSCULAR | Status: AC
  Administered 2020-02-27: 5 mg via INTRAVENOUS
  Filled 2020-02-27: qty 1

## 2020-02-27 MED ORDER — SODIUM CHLORIDE 0.9 % IV SOLN: 100.0000 mg | Freq: Every day | INTRAVENOUS | Status: DC

## 2020-02-27 MED ORDER — LABETALOL HCL 5 MG/ML IV SOLN
10.0000 mg | INTRAVENOUS | Status: DC | PRN
  Administered 2020-02-27 – 2020-02-29 (×2): 10 mg via INTRAVENOUS
  Filled 2020-02-27 (×2): qty 4

## 2020-02-27 MED ORDER — SODIUM CHLORIDE 0.9 % IV SOLN
100.0000 mg | Freq: Every day | INTRAVENOUS | Status: AC
  Administered 2020-02-28 – 2020-03-01 (×3): 100 mg via INTRAVENOUS
  Filled 2020-02-27 (×3): qty 20

## 2020-02-27 MED ORDER — ACETAMINOPHEN 325 MG PO TABS: 650.0000 mg | ORAL_TABLET | Freq: Four times a day (QID) | ORAL | Status: DC | PRN

## 2020-02-27 MED ORDER — GUAIFENESIN-DM 100-10 MG/5ML PO SYRP: 10.0000 mL | ORAL_SOLUTION | ORAL | Status: DC | PRN

## 2020-02-27 MED ORDER — SODIUM CHLORIDE 0.9 % IV SOLN: INTRAVENOUS | Status: AC

## 2020-02-27 MED ORDER — HEPARIN (PORCINE) 25000 UT/250ML-% IV SOLN
650.0000 [IU]/h | INTRAVENOUS | Status: DC
  Administered 2020-02-27: 800 [IU]/h via INTRAVENOUS
  Administered 2020-02-28: 650 [IU]/h via INTRAVENOUS
  Filled 2020-02-27 (×2): qty 250

## 2020-02-27 MED ORDER — SODIUM CHLORIDE 0.9 % IV SOLN
INTRAVENOUS | Status: DC | PRN
  Administered 2020-02-27: 250 mL via INTRAVENOUS

## 2020-02-27 MED ORDER — PREDNISONE 20 MG PO TABS
50.0000 mg | ORAL_TABLET | Freq: Every day | ORAL | Status: DC
  Administered 2020-03-01 – 2020-03-02 (×2): 50 mg via ORAL
  Filled 2020-02-27: qty 2

## 2020-02-27 MED ORDER — ALBUTEROL SULFATE HFA 108 (90 BASE) MCG/ACT IN AERS
2.0000 | INHALATION_SPRAY | Freq: Two times a day (BID) | RESPIRATORY_TRACT | Status: DC
  Administered 2020-02-28 – 2020-03-02 (×8): 2 via RESPIRATORY_TRACT

## 2020-02-27 MED ORDER — HEPARIN BOLUS VIA INFUSION
1600.0000 [IU] | INTRAVENOUS | Status: AC
  Administered 2020-02-27: 1600 [IU] via INTRAVENOUS
  Filled 2020-02-27: qty 1600

## 2020-02-27 MED ORDER — LEVOTHYROXINE SODIUM 25 MCG PO TABS
25.0000 ug | ORAL_TABLET | Freq: Every day | ORAL | Status: DC
  Administered 2020-02-28 – 2020-03-02 (×4): 25 ug via ORAL
  Filled 2020-02-27 (×4): qty 1

## 2020-02-27 NOTE — ED Notes (Signed)
Pt stood to use bedside commode, also ate oatmeal and juice for breakfast.

## 2020-02-27 NOTE — ED Notes (Signed)
Pt unsure of her daily medications

## 2020-02-27 NOTE — Progress Notes (Signed)
ANTICOAGULATION CONSULT NOTE  Pharmacy Consult for Heparin  Indication: suspected VTE  No Known Allergies  Patient Measurements: Height: 5\' 2"  (157.5 cm) Weight: 63.4 kg (139 lb 12.4 oz) IBW/kg (Calculated) : 50.1 Heparin Dosing Weight: TBW  Vital Signs: Temp: 97.9 F (36.6 C) (09/18 2015) Temp Source: Axillary (09/18 1725) BP: 185/72 (09/18 2015) Pulse Rate: 61 (09/18 2015)  Labs: Recent Labs    02/26/20 1620 02/27/20 1415 02/27/20 2244  HGB 12.6  --   --   HCT 38.9  --   --   PLT 377  --   --   APTT  --  63*  --   LABPROT  --  15.3*  --   INR  --  1.3*  --   HEPARINUNFRC  --   --  0.51  CREATININE 1.62*  --   --     Estimated Creatinine Clearance: 17.4 mL/min (A) (by C-G formula based on SCr of 1.62 mg/dL (H)).   Medical History: Past Medical History:  Diagnosis Date   Hypertension    Shoulder fracture, right    Thyroid disease     Medications:  Medications Prior to Admission  Medication Sig Dispense Refill Last Dose   amLODipine (NORVASC) 10 MG tablet Take 10 mg by mouth daily.   Past Month at Unknown time   losartan (COZAAR) 25 MG tablet Take 25 mg by mouth daily.   Past Month at Unknown time   meclizine (ANTIVERT) 25 MG tablet Take 25 mg by mouth daily as needed for dizziness.    Past Month at Unknown time   Scheduled:   albuterol  2 puff Inhalation Q6H   [START ON 02/28/2020] levothyroxine  25 mcg Oral Q0600   methylPREDNISolone (SOLU-MEDROL) injection  0.5 mg/kg Intravenous Q12H   Followed by   03/01/2020 ON 03/01/2020] predniSONE  50 mg Oral Daily   Infusions:   sodium chloride Stopped (02/27/20 1210)   sodium chloride 75 mL/hr at 02/27/20 1854   heparin 800 Units/hr (02/27/20 1700)   [START ON 02/28/2020] remdesivir 100 mg in NS 100 mL      Assessment: 89 yoF presented to [facility] on 9/17 with COVID-19 pneumonia, transferred to [facility] on 9/18.  She has an elevated D-dimer > 20, and suspected VTE.  Pharmacy is consulted to dose heparin  IV No PTA anticoagulation. Baseline labs: CBC: Hgb 12.6, Plt wnl, SCr 1.62, INR 1.3, aptt 63sec Preliminary dopplers + bilateral DVT  02/27/2020: -Initial heparin level therapeutic at 0.51 on 800 units/hr -No bleeding or complications reported by RN  Goal of Therapy:  Heparin level 0.3-0.7 units/ml Monitor platelets by anticoagulation protocol: Yes   Plan:  Continue heparin IV infusion at 800 units/hr Confirmatory heparin level in 8 hours  Daily heparin level and CBC F/U long-term heparin plans  02/29/2020 PharmD, BCPS Clinical Pharmacist [facility] main pharmacy [phone] 02/27/2020 11:26 PM

## 2020-02-27 NOTE — H&P (Signed)
History and Physical        Hospital Admission Note Date: 02/27/2020  Patient name: Stacy Huang Medical record number: [id number] Date of birth: 1921/01/07 Age: 86 y.o. Gender: female  PCP: Doreen Salvage, PA-C  Patient coming from: home Lives with: son At baseline, ambulates: walker  Chief Complaint    Chief Complaint  Patient presents with  . Weakness  . Dizziness      HPI:   This is a very pleasant 86 year old female who has been vaccinated against COVID-19 in April with a past medical history of hypothyroidism who presented to [facility] with complaints of dry cough, generalized weakness and dizziness x2 weeks.  With associated change in taste and bilateral lower extremity discomfort..  Denies any chest pain, fever, chills, night sweats, nausea or vomiting.  No other significant symptoms.  [facility] ED Course: Afebrile, tachypneic, hypertensive, 88% on room air requiring 2 L nasal cannula.  Notable labs: Glucose 111, BUN 25, creatinine 1.62 (unknown baseline) LDH 234, ferritin 468, CRP 15.3, procalcitonin 0.13, WBC 16.4, D-dimer > 20, fibrinogen 518, COVID-19 positive.  CXR with mixed interstitial more patchy peripheral basilar and peripheral airspace opacities bilaterally concerning for multifocal infection.  She was given dexamethasone and remdesivir and transferred to [facility] for further management of COVID-19.  Vitals:   02/27/20 1227 02/27/20 1358  BP: (!) 189/70 (!) 171/77  Pulse: 80 78  Resp: 20 20  Temp:    SpO2: 98% 97%     Review of Systems:  Review of Systems  Constitutional: Positive for malaise/fatigue. Negative for chills and fever.  HENT: Negative.   Eyes: Negative.   Respiratory: Positive for cough and shortness of breath. Negative for wheezing.   Cardiovascular: Negative for chest pain and palpitations.  Gastrointestinal: Negative for nausea and vomiting.    Genitourinary: Negative.   Musculoskeletal:       Bilateral ankle pain  Neurological: Positive for dizziness and headaches. Negative for focal weakness.  All other systems reviewed and are negative.   Medical/Social/Family History   Past Medical History: Past Medical History:  Diagnosis Date  . Hypertension   . Shoulder fracture, right   . Thyroid disease     History reviewed. No pertinent surgical history.  Medications: Prior to Admission medications   Medication Sig Start Date End Date Taking? Authorizing Provider  Levothyroxine Sodium (SYNTHROID PO) Take by mouth.   Yes [provider]  UNKNOWN TO PATIENT Blood pressure med   Yes [provider]  azithromycin (ZITHROMAX Z-PAK) 250 MG tablet 2 po day one, then 1 daily x 4 days 06/06/13   Geoffery Lyons, MD  carbamide peroxide (DEBROX) 6.5 % OTIC solution Place 5 drops into both ears 2 (two) times daily. 11/10/19   Curatolo, Adam, DO  lidocaine (LIDODERM) 5 % Place 1 patch onto the skin daily. Remove & Discard patch within 12 hours or as directed by MD 10/03/15   Gwyneth Sprout, MD    Allergies:  No Known Allergies  Social History:  reports that she has never smoked. She has never used smokeless tobacco. She reports that she does not drink alcohol and does not use drugs.  Family History: No family history on file.   Objective  Physical Exam: Blood pressure (!) 171/77, pulse 78, temperature 98.5 F (36.9 C), temperature source Oral, resp. rate 20, height 5\' 2"  (1.575 m), weight 63.4 kg, SpO2 97 %.  Physical Exam Vitals and nursing note reviewed.  Constitutional:      Appearance: Normal appearance.  HENT:     Head: Normocephalic and atraumatic.  Eyes:     Conjunctiva/sclera: Conjunctivae normal.  Cardiovascular:     Rate and Rhythm: Normal rate and regular rhythm.  Pulmonary:     Effort: Pulmonary effort is normal. No respiratory distress.  Abdominal:     General: Abdomen is flat.      Palpations: Abdomen is soft.  Musculoskeletal:     Comments: Trace edema up to ankles with slight distal calf tenderness to palpation  Skin:    Coloration: Skin is not jaundiced or pale.  Neurological:     Mental Status: She is alert. Mental status is at baseline.  Psychiatric:        Mood and Affect: Mood normal.        Behavior: Behavior normal.     LABS on Admission: I have personally reviewed all the labs and imaging below    Basic Metabolic Panel: Recent Labs  Lab 02/26/20 1620  NA 139  K 3.8  CL 103  CO2 23  GLUCOSE 111*  BUN 25*  CREATININE 1.62*  CALCIUM 8.6*   Liver Function Tests: Recent Labs  Lab 02/26/20 1620  AST 20  ALT 13  ALKPHOS 74  BILITOT 0.6  PROT 7.6  ALBUMIN 2.9*   No results for input(s): LIPASE, AMYLASE in the last 168 hours. No results for input(s): AMMONIA in the last 168 hours. CBC: Recent Labs  Lab 02/26/20 1620  WBC 16.4*  NEUTROABS 14.2*  HGB 12.6  HCT 38.9  MCV 91.5  PLT 377   Cardiac Enzymes: No results for input(s): CKTOTAL, CKMB, CKMBINDEX, TROPONINI in the last 168 hours. BNP: Invalid input(s): POCBNP CBG: No results for input(s): GLUCAP in the last 168 hours.  Radiological Exams on Admission:  DG Chest Portable 1 View  Result Date: 02/26/2020 CLINICAL DATA:  Weakness, oxygen saturation of 88% on room air EXAM: PORTABLE CHEST 1 VIEW COMPARISON:  CT 02/19/2008 FINDINGS: Mixed interstitial and more patchy basilar and peripheral airspace opacities bilaterally. No pneumothorax or visible effusion. The aorta is calcified. The remaining cardiomediastinal contours are unremarkable. No acute osseous or soft tissue abnormality. Likely geode formation of the left humeral head. Degenerative changes are present in the imaged spine and shoulders. Nasal cannula overlies the chest. IMPRESSION: Mixed interstitial and more patchy peripheral basilar and peripheral airspace opacities bilaterally. Findings are concerning for a multifocal  infection including atypical infection including such as COVID 19. Otherwise, and mixed interstitial and alveolar edema could present similarly. Electronically Signed   By: 04/20/2008 M.D.   On: 02/26/2020 15:19      EKG: Independently reviewed.    A & P   Principal Problem:   Acute hypoxemic respiratory failure due to COVID-19 [facility]) Active Problems:   AKI (acute kidney injury) (HCC)   Positive D dimer   Hypertensive urgency   Generalized weakness   1. Acute hypoxic respiratory failure secondary to COVID-19 a. Afebrile, requiring 2 L nasal cannula, WBC 16 b. Elevated inflammatory markers: CRP 15 c. D-dimer > 20 - unable to get CTA chest due to renal function, will empirically start heparin and check lower extremity doppler for DVT. If renal function improved tomorrow then consider CTA  chest.  d. Procalcitonin 0.13 -will hold off on antibiotics for now since afebrile e. Continue steroids and remdesivir f. Inhalers, incentive spirometry, flutter valve  2. AKI, likely from hemodynamic changes a. Cr 1.6, unknown baseline b. Continue treatment as outliined  3. Generalized weakness a. PT eval  4. Hypertensive urgency a. Not on antihypertensives at home b. Labetalol PRN  5. Hypothyroidism a. Check TSH b. Continue home levothyroxine   DVT prophylaxis: heparin   Code Status: DNR  Diet: regular Family Communication: Admission, patients condition and plan of care including tests being ordered have been discussed with the patient who indicates understanding and agrees with the plan and Code Status. Patient's son was updated  Disposition Plan: The appropriate patient status for this patient is INPATIENT. Inpatient status is judged to be reasonable and necessary in order to provide the required intensity of service to ensure the patient's safety. The patient's presenting symptoms, physical exam findings, and initial radiographic and laboratory data in the context of their chronic  comorbidities is felt to place them at high risk for further clinical deterioration. Furthermore, it is not anticipated that the patient will be medically stable for discharge from the hospital within 2 midnights of admission. The following factors support the patient status of inpatient.   " The patient's presenting symptoms include dizziness, shortness of breath. " The worrisome physical exam findings include hypoxia, leg swelling/pain. " The initial radiographic and laboratory data are worrisome because of elevated inflammatory markers, covid 19 positive. " The chronic co-morbidities include hypothyroidism.   * I certify that at the point of admission it is my clinical judgment that the patient will require inpatient hospital care spanning beyond 2 midnights from the point of admission due to high intensity of service, high risk for further deterioration and high frequency of surveillance required.*   Status is: Inpatient  Remains inpatient appropriate because:IV treatments appropriate due to intensity of illness or inability to take PO and Inpatient level of care appropriate due to severity of illness   Dispo: The patient is from: Home              Anticipated d/c is to: Home              Anticipated d/c date is: 2 days              Patient currently is not medically stable to d/c.      Consultants  . none  Procedures  . none  Time Spent on Admission: 67 minutes    Jae Dire, DO [facility] Pager [phone] 02/27/2020, 2:34 PM

## 2020-02-27 NOTE — ED Notes (Signed)
[facility] at bedside has all paperwork

## 2020-02-27 NOTE — Progress Notes (Signed)
Bilateral lower extremity venous duplex has been completed. Preliminary results can be found in CV Proc through chart review.  Results were given to the patient's nurse, Kim.  02/27/20 4:08 PM Olen Cordial RVT

## 2020-02-27 NOTE — Progress Notes (Signed)
ANTICOAGULATION CONSULT NOTE - Initial Consult  Pharmacy Consult for Heparin  Indication: suspected VTE  No Known Allergies  Patient Measurements: Height: 5\' 2"  (157.5 cm) Weight: 63.4 kg (139 lb 12.4 oz) IBW/kg (Calculated) : 50.1 Heparin Dosing Weight: TBW  Vital Signs: Temp: 98.5 F (36.9 C) (09/18 1227) Temp Source: Oral (09/18 1227) BP: 171/77 (09/18 1358) Pulse Rate: 78 (09/18 1358)  Labs: Recent Labs    02/26/20 1620  HGB 12.6  HCT 38.9  PLT 377  CREATININE 1.62*    Estimated Creatinine Clearance: 17.4 mL/min (A) (by C-G formula based on SCr of 1.62 mg/dL (H)).   Medical History: Past Medical History:  Diagnosis Date  . Hypertension   . Shoulder fracture, right   . Thyroid disease     Medications:  Medications Prior to Admission  Medication Sig Dispense Refill Last Dose  . Levothyroxine Sodium (SYNTHROID PO) Take by mouth.     02/28/20 UNKNOWN TO PATIENT Blood pressure med     . azithromycin (ZITHROMAX Z-PAK) 250 MG tablet 2 po day one, then 1 daily x 4 days 6 tablet 0   . carbamide peroxide (DEBROX) 6.5 % OTIC solution Place 5 drops into both ears 2 (two) times daily. 15 mL 0   . lidocaine (LIDODERM) 5 % Place 1 patch onto the skin daily. Remove & Discard patch within 12 hours or as directed by MD 30 patch 0    Scheduled:  . albuterol  2 puff Inhalation Q6H  . [START ON 02/28/2020] levothyroxine  25 mcg Oral Q0600  . methylPREDNISolone (SOLU-MEDROL) injection  0.5 mg/kg Intravenous Q12H   Followed by  . [START ON 03/01/2020] predniSONE  50 mg Oral Daily   Infusions:  . sodium chloride 250 mL (02/27/20 1121)    Assessment: 89 yoF [facility] with COVID-19 pneumonia, transferred to [facility] on 9/8.  She has an elevated D-dimer > 20, and suspected VTE.  Pharmacy is consulted to dose heparin IV No PTA anticoagulation. CBC: Hgb 12.6, Plt wnl SCr 1.62 Baseline coags pending No bleeding or complications reported  Goal of Therapy:  Heparin level  0.3-0.7 units/ml Monitor platelets by anticoagulation protocol: Yes   Plan:  [place] dosing nomogram used Give heparin 1600 units bolus IV x 1 Start heparin IV infusion at 800 units/hr Heparin level 8 hours after starting Daily heparin level and CBC   11/8 PharmD, BCPS Clinical Pharmacist [facility] main pharmacy [phone] 02/27/2020 2:14 PM

## 2020-02-27 NOTE — ED Notes (Signed)
This RN spoke with pt Son Stephannie Peters and update phone number in the chart to [phone], family made aware of bed assignment at [facility]

## 2020-02-28 LAB — COMPREHENSIVE METABOLIC PANEL
ALT: 13 U/L (ref 0–44)
AST: 16 U/L (ref 15–41)
Albumin: 2.6 g/dL — ABNORMAL LOW (ref 3.5–5.0)
Alkaline Phosphatase: 70 U/L (ref 38–126)
Anion gap: 11 (ref 5–15)
BUN: 39 mg/dL — ABNORMAL HIGH (ref 8–23)
CO2: 21 mmol/L — ABNORMAL LOW (ref 22–32)
Calcium: 8.3 mg/dL — ABNORMAL LOW (ref 8.9–10.3)
Chloride: 110 mmol/L (ref 98–111)
Creatinine, Ser: 1.52 mg/dL — ABNORMAL HIGH (ref 0.44–1.00)
GFR calc Af Amer: 33 mL/min — ABNORMAL LOW (ref >60)
GFR calc non Af Amer: 28 mL/min — ABNORMAL LOW (ref >60)
Glucose, Bld: 169 mg/dL — ABNORMAL HIGH (ref 70–99)
Potassium: 4 mmol/L (ref 3.5–5.1)
Sodium: 142 mmol/L (ref 135–145)
Total Bilirubin: 0.4 mg/dL (ref 0.3–1.2)
Total Protein: 6.4 g/dL — ABNORMAL LOW (ref 6.5–8.1)

## 2020-02-28 LAB — D-DIMER, QUANTITATIVE: D-Dimer, Quant: >20 ug/mL-FEU — ABNORMAL HIGH (ref 0.00–0.50)

## 2020-02-28 LAB — CBC WITH DIFFERENTIAL/PLATELET
Abs Immature Granulocytes: 0.58 10*3/uL — ABNORMAL HIGH (ref 0.00–0.07)
Basophils Absolute: 0 10*3/uL (ref 0.0–0.1)
Basophils Relative: 0 %
Eosinophils Absolute: 0 10*3/uL (ref 0.0–0.5)
Eosinophils Relative: 0 %
HCT: 35.9 % — ABNORMAL LOW (ref 36.0–46.0)
Hemoglobin: 11.6 g/dL — ABNORMAL LOW (ref 12.0–15.0)
Immature Granulocytes: 4 %
Lymphocytes Relative: 4 %
Lymphs Abs: 0.7 10*3/uL (ref 0.7–4.0)
MCH: 30.3 pg (ref 26.0–34.0)
MCHC: 32.3 g/dL (ref 30.0–36.0)
MCV: 93.7 fL (ref 80.0–100.0)
Monocytes Absolute: 0.6 10*3/uL (ref 0.1–1.0)
Monocytes Relative: 4 %
Neutro Abs: 13.5 10*3/uL — ABNORMAL HIGH (ref 1.7–7.7)
Neutrophils Relative %: 88 %
Platelets: 338 10*3/uL (ref 150–400)
RBC: 3.83 MIL/uL — ABNORMAL LOW (ref 3.87–5.11)
RDW: 13.3 % (ref 11.5–15.5)
WBC: 15.3 10*3/uL — ABNORMAL HIGH (ref 4.0–10.5)
nRBC: 0 % (ref 0.0–0.2)

## 2020-02-28 LAB — C-REACTIVE PROTEIN: CRP: 9.8 mg/dL — ABNORMAL HIGH (ref <1.0)

## 2020-02-28 LAB — MAGNESIUM: Magnesium: 2.3 mg/dL (ref 1.7–2.4)

## 2020-02-28 LAB — HEPARIN LEVEL (UNFRACTIONATED)
Heparin Unfractionated: 0.82 IU/mL — ABNORMAL HIGH (ref 0.30–0.70)
Heparin Unfractionated: 0.56 IU/mL (ref 0.30–0.70)

## 2020-02-28 LAB — FERRITIN: Ferritin: 467 ng/mL — ABNORMAL HIGH (ref 11–307)

## 2020-02-28 NOTE — Progress Notes (Signed)
      The patient complained about having pressure in her stomach, the nurse noticed that she didn't urinate during the night, the tech bladder scanned the patient and she has 396 cc in her bladder, the nurse paged the provider about the situation and that I was going to straight cath her due to her being in pain,  I straight cath her and got 250 ml out, the patient stated that she felt better.

## 2020-02-28 NOTE — Progress Notes (Signed)
ANTICOAGULATION CONSULT NOTE  Pharmacy Consult for Heparin  Indication: suspected VTE  No Known Allergies  Patient Measurements: Height: 5\' 2"  (157.5 cm) Weight: 63.4 kg (139 lb 12.4 oz) IBW/kg (Calculated) : 50.1 Heparin Dosing Weight: TBW  Vital Signs: Temp: 97.8 F (36.6 C) (09/19 1338) Temp Source: Oral (09/19 1338) BP: 157/91 (09/19 1556) Pulse Rate: 60 (09/19 1556)  Labs: Recent Labs    02/26/20 1620 02/27/20 1415 02/27/20 2244 02/28/20 0549 02/28/20 1614  HGB 12.6  --   --  11.6*  --   HCT 38.9  --   --  35.9*  --   PLT 377  --   --  338  --   APTT  --  63*  --   --   --   LABPROT  --  15.3*  --   --   --   INR  --  1.3*  --   --   --   HEPARINUNFRC  --   --  0.51 0.82* 0.56  CREATININE 1.62*  --   --  1.52*  --     Estimated Creatinine Clearance: 18.5 mL/min (A) (by C-G formula based on SCr of 1.52 mg/dL (H)).   Medical History: Past Medical History:  Diagnosis Date  . Hypertension   . Shoulder fracture, right   . Thyroid disease     Medications:  Medications Prior to Admission  Medication Sig Dispense Refill Last Dose  . amLODipine (NORVASC) 10 MG tablet Take 10 mg by mouth daily.   Past Month at Unknown time  . losartan (COZAAR) 25 MG tablet Take 25 mg by mouth daily.   Past Month at Unknown time  . meclizine (ANTIVERT) 25 MG tablet Take 25 mg by mouth daily as needed for dizziness.    Past Month at Unknown time   Scheduled:  . albuterol  2 puff Inhalation BID  . levothyroxine  25 mcg Oral Q0600  . methylPREDNISolone (SOLU-MEDROL) injection  0.5 mg/kg Intravenous Q12H   Followed by  . [START ON 03/01/2020] predniSONE  50 mg Oral Daily   Infusions:  . sodium chloride 10 mL/hr at 02/28/20 0839  . heparin 650 Units/hr (02/28/20 1500)  . remdesivir 100 mg in NS 100 mL Stopped (02/28/20 1111)    Assessment: 89 yoF presented to [facility] on 9/17 with COVID-19 pneumonia, transferred to [facility] on 9/18.  She has an elevated D-dimer > 20, and suspected VTE.   Pharmacy is consulted to dose heparin IV No PTA anticoagulation. Baseline labs: CBC: Hgb 12.6, Plt wnl, SCr 1.62, INR 1.3, aptt 63sec Preliminary dopplers + bilateral DVT  02/28/2020: -Heparin level now SUPRAtherapeutic at 0.82 on 800 units/hr -No bleeding or complications reported by RN -1614 HL 0.56, therapeutic on 650 units/hr  Goal of Therapy:  Heparin level 0.3-0.7 units/ml Monitor platelets by anticoagulation protocol: Yes   Plan:  continue heparin IV infusion at 650 units/hr Check heparin level with daily labs Daily  CBC F/U long-term heparin plans  03/01/2020 RPh 02/28/2020, 5:24 PM

## 2020-02-28 NOTE — Progress Notes (Signed)
PROGRESS NOTE    Stacy Huang  NWG:[id number] DOB: [birth year] DOA: 02/26/2020 PCP: Doreen Salvage, PA-C    Brief Narrative:  86 year old female with history of hypothyroidism presented to ER with dry cough, generalized weakness and dizziness for 2 weeks.  Also complaining about bilateral lower extremity discomfort. She is vaccinated against COVID-19 In the emergency room, afebrile tachypneic and hypertensive.  88% room air requiring 2 L nasal cannula.  CRP 15.  D-dimer more than 20.  COVID-19 positive.  Chest x-ray with bilateral interstitial infiltrates.   Assessment & Plan:   Principal Problem:   Acute hypoxemic respiratory failure due to COVID-19 [facility]) Active Problems:   AKI (acute kidney injury) (HCC)   Positive D dimer   Hypertensive urgency   Generalized weakness  COVID-19 pneumonia with hypoxemia: Continue to monitor due to significant symptoms  chest physiotherapy, incentive spirometry, deep breathing exercises, sputum induction, mucolytic's and bronchodilators. Supplemental oxygen to keep saturations more than 90%. Covid directed therapy with , steroids, on Solu-Medrol remdesivir, day 3/5 Due to severity of symptoms, patient will need daily inflammatory markers, chest x-rays, liver function test to monitor and direct COVID-19 therapies.  COVID-19 Labs  Recent Labs    02/26/20 1620 02/26/20 1621 02/28/20 0549  DDIMER  --  >20.00* >20.00*  FERRITIN 468*  --  467*  LDH 234*  --   --   CRP 15.3*  --  9.8*    Lab Results  Component Value Date   SARSCOV2NAA POSITIVE (A) 02/26/2020   SpO2: 97 % O2 Flow Rate (L/min): 2 L/min  Abnormal kidney function: Available previous kidney function test from care everywhere with creatinine 0.99 in 10/2015.  Probably has progressive kidney disease.  Remains fairly stable.  Hypothyroidism: On Synthroid.  Continue home doses.  Acute DVT right posterior tibial veins, right peroneal vein/acute DVT left peroneal veins: With  D-dimer more than 20.  On heparin.  Will treat with 3 to 6 months of anticoagulation. We will keep on heparin infusion until respiratory function stabilizes and will subsequently changed to NOAC. May have pulmonary embolism, will not subject patient to contrast dye as it would not change management.  We will anticoagulate.  DVT prophylaxis: Heparin infusion.   Code Status: DNR Family Communication: We will call family Disposition Plan: Status is: Inpatient  Remains inpatient appropriate because:Inpatient level of care appropriate due to severity of illness   Dispo: The patient is from: Home              Anticipated d/c is to: Home              Anticipated d/c date is: 3 days              Patient currently is not medically stable to d/c.         Consultants:   None  Procedures:   None  Antimicrobials:  Anti-infectives (From admission, onward)   Start     Dose/Rate Route Frequency Ordered Stop   02/28/20 1000  remdesivir 100 mg in sodium chloride 0.9 % 100 mL IVPB  Status:  Discontinued       "Followed by" Linked Group Details   100 mg 200 mL/hr over 30 Minutes Intravenous Daily 02/27/20 1400 02/27/20 1403   02/28/20 1000  remdesivir 100 mg in sodium chloride 0.9 % 100 mL IVPB        100 mg 200 mL/hr over 30 Minutes Intravenous Daily 02/27/20 1423 03/02/20 0959   02/27/20 1400  remdesivir 200  mg in sodium chloride 0.9% 250 mL IVPB  Status:  Discontinued       "Followed by" Linked Group Details   200 mg 580 mL/hr over 30 Minutes Intravenous Once 02/27/20 1400 02/27/20 1403   02/27/20 1000  remdesivir 100 mg in sodium chloride 0.9 % 100 mL IVPB  Status:  Discontinued       "Followed by" Linked Group Details   100 mg 200 mL/hr over 30 Minutes Intravenous Daily 02/26/20 1924 02/27/20 1401   02/26/20 2000  remdesivir 200 mg in sodium chloride 0.9% 250 mL IVPB       "Followed by" Linked Group Details   200 mg 580 mL/hr over 30 Minutes Intravenous Once 02/26/20 1924  02/26/20 2020         Subjective: Patient seen and examined.  No overnight events.  Denies any shortness of breath.  Poor historian.  Her main complaint is right knee and calf pain.  Not mobilized in the hospital.  Objective: Vitals:   02/27/20 1725 02/27/20 2015 02/28/20 0112 02/28/20 0525  BP: (!) 173/81 (!) 185/72 (!) 166/76 (!) 172/79  Pulse: 81 61 (!) 58 (!) 58  Resp: (!) 22 (!) 23 (!) 23 20  Temp: 97.7 F (36.5 C) 97.9 F (36.6 C) 98.9 F (37.2 C) 98.2 F (36.8 C)  TempSrc: Axillary     SpO2: 92% 97% 97% 97%  Weight:      Height:        Intake/Output Summary (Last 24 hours) at 02/28/2020 1203 Last data filed at 02/28/2020 1113 Gross per 24 hour  Intake 2171.72 ml  Output 0 ml  Net 2171.72 ml   Filed Weights   02/26/20 1451 02/27/20 1227  Weight: 69.4 kg 63.4 kg    Examination:  General exam: Appears calm and comfortable, appropriate for age.  On 2 L oxygen. Respiratory system: Clear to auscultation. Respiratory effort normal.  No added sounds. Cardiovascular system: S1 & S2 heard, RRR. No JVD, murmurs, rubs, gallops or clicks.  Bilateral trace pedal edema, some tenderness along the right posterior calf. Gastrointestinal system: Abdomen is nondistended, soft and nontender. No organomegaly or masses felt. Normal bowel sounds heard. Central nervous system: Alert and oriented. No focal neurological deficits. Extremities: Symmetric 5 x 5 power. Skin: No rashes, lesions or ulcers Psychiatry: Judgement and insight appear normal. Mood & affect appropriate.     Data Reviewed: I have personally reviewed following labs and imaging studies  CBC: Recent Labs  Lab 02/26/20 1620 02/28/20 0549  WBC 16.4* 15.3*  NEUTROABS 14.2* 13.5*  HGB 12.6 11.6*  HCT 38.9 35.9*  MCV 91.5 93.7  PLT 377 338   Basic Metabolic Panel: Recent Labs  Lab 02/26/20 1620 02/28/20 0549  NA 139 142  K 3.8 4.0  CL 103 110  CO2 23 21*  GLUCOSE 111* 169*  BUN 25* 39*  CREATININE  1.62* 1.52*  CALCIUM 8.6* 8.3*  MG  --  2.3   GFR: Estimated Creatinine Clearance: 18.5 mL/min (A) (by C-G formula based on SCr of 1.52 mg/dL (H)). Liver Function Tests: Recent Labs  Lab 02/26/20 1620 02/28/20 0549  AST 20 16  ALT 13 13  ALKPHOS 74 70  BILITOT 0.6 0.4  PROT 7.6 6.4*  ALBUMIN 2.9* 2.6*   No results for input(s): LIPASE, AMYLASE in the last 168 hours. No results for input(s): AMMONIA in the last 168 hours. Coagulation Profile: Recent Labs  Lab 02/27/20 1415  INR 1.3*   Cardiac Enzymes: No results  for input(s): CKTOTAL, CKMB, CKMBINDEX, TROPONINI in the last 168 hours. BNP (last 3 results) No results for input(s): PROBNP in the last 8760 hours. HbA1C: No results for input(s): HGBA1C in the last 72 hours. CBG: No results for input(s): GLUCAP in the last 168 hours. Lipid Profile: Recent Labs    02/26/20 1620  TRIG 146   Thyroid Function Tests: No results for input(s): TSH, T4TOTAL, FREET4, T3FREE, THYROIDAB in the last 72 hours. Anemia Panel: Recent Labs    02/26/20 1620 02/28/20 0549  FERRITIN 468* 467*   Sepsis Labs: Recent Labs  Lab 02/26/20 1620  PROCALCITON 0.13  LATICACIDVEN 1.3    Recent Results (from the past 240 hour(s))  SARS Coronavirus 2 by RT PCR (hospital order, performed in [facility] hospital lab) Nasopharyngeal Nasopharyngeal Swab     Status: Abnormal   Collection Time: 02/26/20  4:20 PM   Specimen: Nasopharyngeal Swab  Result Value Ref Range Status   SARS Coronavirus 2 POSITIVE (A) NEGATIVE Final    Comment: RESULT CALLED TO, READ BACK BY AND VERIFIED WITH: Solmon Ice RN [phone] PHILLIPS C (NOTE) SARS-CoV-2 target nucleic acids are DETECTED  SARS-CoV-2 RNA is generally detectable in upper respiratory specimens  during the acute phase of infection.  Positive results are indicative  of the presence of the identified virus, but do not rule out bacterial infection or co-infection with other pathogens not detected by  the test.  Clinical correlation with patient history and  other diagnostic information is necessary to determine patient infection status.  The expected result is negative.  Fact Sheet for Patients:   BoilerBrush.com.cy   Fact Sheet for Healthcare Providers:   https://pope.com/    This test is not yet approved or cleared by the [place] FDA and  has been authorized for detection and/or diagnosis of SARS-CoV-2 by FDA under an Emergency Use Authorization (EUA).  This EUA will remain in effect (meaning this te st can be used) for the duration of  the COVID-19 declaration under Section 564(b)(1) of the Act, 21 U.S.C. section 360-bbb-3(b)(1), unless the authorization is terminated or revoked sooner.  Performed at [facility], [address] [place], [place] [address]   Blood Culture (routine x 2)     Status: None (Preliminary result)   Collection Time: 02/26/20  4:21 PM   Specimen: BLOOD  Result Value Ref Range Status   Specimen Description   Final    BLOOD RIGHT ANTECUBITAL Performed at [facility], [address], [place], [place] [address]    Special Requests   Final    BOTTLES DRAWN AEROBIC AND ANAEROBIC Blood Culture adequate volume Performed at [facility], [address] [place], [place] [address]    Culture   Final    NO GROWTH < 24 HOURS Performed at [facility], [address], [place], [place] [address]    Report Status PENDING  Incomplete         Radiology Studies: DG Chest Portable 1 View  Result Date: 02/26/2020 CLINICAL DATA:  Weakness, oxygen saturation of 88% on room air EXAM: PORTABLE CHEST 1 VIEW COMPARISON:  CT 02/19/2008 FINDINGS: Mixed interstitial and more patchy basilar and peripheral airspace opacities bilaterally. No pneumothorax or visible effusion. The aorta is calcified. The remaining cardiomediastinal contours are unremarkable. No acute osseous or soft  tissue abnormality. Likely geode formation of the left humeral head. Degenerative changes are present in the imaged spine and shoulders. Nasal  cannula overlies the chest. IMPRESSION: Mixed interstitial and more patchy peripheral basilar and peripheral airspace opacities bilaterally. Findings are concerning for a multifocal infection including atypical infection including such as COVID 19. Otherwise, and mixed interstitial and alveolar edema could present similarly. Electronically Signed   By: Kreg Shropshire M.D.   On: 02/26/2020 15:19   VAS [place] LOWER EXTREMITY VENOUS (DVT)  Result Date: 02/28/2020  Lower Venous DVTStudy Indications: Elevated Ddimer.  Risk Factors: COVID 19 positive. Limitations: Patient positioning and poor ultrasound/tissue interface. Comparison Study: No prior studies. Performing Technologist: Chanda Busing RVT  Examination Guidelines: A complete evaluation includes B-mode imaging, spectral Doppler, color Doppler, and power Doppler as needed of all accessible portions of each vessel. Bilateral testing is considered an integral part of a complete examination. Limited examinations for reoccurring indications may be performed as noted. The reflux portion of the exam is performed with the patient in reverse Trendelenburg.  +---------+---------------+---------+-----------+----------+--------------+ RIGHT    CompressibilityPhasicitySpontaneityPropertiesThrombus Aging +---------+---------------+---------+-----------+----------+--------------+ CFV      Full           Yes      Yes                                 +---------+---------------+---------+-----------+----------+--------------+ SFJ      Full                                                        +---------+---------------+---------+-----------+----------+--------------+ FV Prox  Full                                                        +---------+---------------+---------+-----------+----------+--------------+ FV  Mid   Full                                                        +---------+---------------+---------+-----------+----------+--------------+ FV DistalFull                                                        +---------+---------------+---------+-----------+----------+--------------+ PFV      Full                                                        +---------+---------------+---------+-----------+----------+--------------+ POP      Full           Yes      Yes                                 +---------+---------------+---------+-----------+----------+--------------+ PTV      None  Acute          +---------+---------------+---------+-----------+----------+--------------+ PERO     Partial                                      Acute          +---------+---------------+---------+-----------+----------+--------------+ SSV      None                                         Acute          +---------+---------------+---------+-----------+----------+--------------+   +---------+---------------+---------+-----------+----------+--------------+ LEFT     CompressibilityPhasicitySpontaneityPropertiesThrombus Aging +---------+---------------+---------+-----------+----------+--------------+ CFV      Full           Yes      Yes                                 +---------+---------------+---------+-----------+----------+--------------+ SFJ      Full                                                        +---------+---------------+---------+-----------+----------+--------------+ FV Prox  Full                                                        +---------+---------------+---------+-----------+----------+--------------+ FV Mid   Full                                                        +---------+---------------+---------+-----------+----------+--------------+ FV DistalFull                                                         +---------+---------------+---------+-----------+----------+--------------+ PFV      Full                                                        +---------+---------------+---------+-----------+----------+--------------+ POP      Full           Yes      Yes                                 +---------+---------------+---------+-----------+----------+--------------+ PTV      Full                                                        +---------+---------------+---------+-----------+----------+--------------+  PERO     Partial                                      Acute          +---------+---------------+---------+-----------+----------+--------------+     Summary: RIGHT: - Findings consistent with acute deep vein thrombosis involving the right posterior tibial veins, and right peroneal veins. - Findings consistent with acute superficial vein thrombosis involving the right small saphenous vein. - Findings suggest new clot progression as compared to previous examination. - No cystic structure found in the popliteal fossa.  LEFT: - Findings consistent with acute deep vein thrombosis involving the left peroneal veins. - No cystic structure found in the popliteal fossa.  *See table(s) above for measurements and observations. Electronically signed by Lemar Livings MD on 02/28/2020 at 11:03:07 AM.    Final         Scheduled Meds: . albuterol  2 puff Inhalation BID  . levothyroxine  25 mcg Oral Q0600  . methylPREDNISolone (SOLU-MEDROL) injection  0.5 mg/kg Intravenous Q12H   Followed by  . [START ON 03/01/2020] predniSONE  50 mg Oral Daily   Continuous Infusions: . sodium chloride 10 mL/hr at 02/28/20 0839  . heparin 650 Units/hr (02/28/20 1107)  . remdesivir 100 mg in NS 100 mL 100 mg (02/28/20 1037)     LOS: 1 day    Time spent: 35 minutes    Dorcas Carrow, MD [facility] Pager [phone]

## 2020-02-28 NOTE — Progress Notes (Signed)
ANTICOAGULATION CONSULT NOTE  Pharmacy Consult for Heparin  Indication: suspected VTE  No Known Allergies  Patient Measurements: Height: 5\' 2"  (157.5 cm) Weight: 63.4 kg (139 lb 12.4 oz) IBW/kg (Calculated) : 50.1 Heparin Dosing Weight: TBW  Vital Signs: Temp: 98.2 F (36.8 C) (09/19 0525) BP: 172/79 (09/19 0525) Pulse Rate: 58 (09/19 0525)  Labs: Recent Labs    02/26/20 1620 02/27/20 1415 02/27/20 2244 02/28/20 0549  HGB 12.6  --   --  11.6*  HCT 38.9  --   --  35.9*  PLT 377  --   --  338  APTT  --  63*  --   --   LABPROT  --  15.3*  --   --   INR  --  1.3*  --   --   HEPARINUNFRC  --   --  0.51 0.82*  CREATININE 1.62*  --   --  1.52*    Estimated Creatinine Clearance: 18.5 mL/min (A) (by C-G formula based on SCr of 1.52 mg/dL (H)).   Medical History: Past Medical History:  Diagnosis Date  . Hypertension   . Shoulder fracture, right   . Thyroid disease     Medications:  Medications Prior to Admission  Medication Sig Dispense Refill Last Dose  . amLODipine (NORVASC) 10 MG tablet Take 10 mg by mouth daily.   Past Month at Unknown time  . losartan (COZAAR) 25 MG tablet Take 25 mg by mouth daily.   Past Month at Unknown time  . meclizine (ANTIVERT) 25 MG tablet Take 25 mg by mouth daily as needed for dizziness.    Past Month at Unknown time   Scheduled:  . albuterol  2 puff Inhalation BID  . levothyroxine  25 mcg Oral Q0600  . methylPREDNISolone (SOLU-MEDROL) injection  0.5 mg/kg Intravenous Q12H   Followed by  . [START ON 03/01/2020] predniSONE  50 mg Oral Daily   Infusions:  . sodium chloride Stopped (02/27/20 1210)  . heparin 800 Units/hr (02/27/20 1700)  . remdesivir 100 mg in NS 100 mL      Assessment: 9 yoF presented to [facility] on 9/17 with COVID-19 pneumonia, transferred to [facility] on 9/18.  She has an elevated D-dimer > 20, and suspected VTE.  Pharmacy is consulted to dose heparin IV No PTA anticoagulation. Baseline labs: CBC: Hgb 12.6, Plt wnl, SCr  1.62, INR 1.3, aptt 63sec Preliminary dopplers + bilateral DVT  02/28/2020: -Heparin level now SUPRAtherapeutic at 0.82 on 800 units/hr -No bleeding or complications reported by RN  Goal of Therapy:  Heparin level 0.3-0.7 units/ml Monitor platelets by anticoagulation protocol: Yes   Plan:  Decrease heparin IV infusion to 650 units/hr Recheck heparin level in 8 hours  Daily heparin level and CBC F/U long-term heparin plans  03/01/2020 PharmD, BCPS Clinical Pharmacist [facility] main pharmacy [phone] 02/28/2020 7:07 AM

## 2020-02-29 LAB — CBC WITH DIFFERENTIAL/PLATELET
Abs Immature Granulocytes: 0.4 10*3/uL — ABNORMAL HIGH (ref 0.00–0.07)
Basophils Absolute: 0 10*3/uL (ref 0.0–0.1)
Basophils Relative: 0 %
Eosinophils Absolute: 0 10*3/uL (ref 0.0–0.5)
Eosinophils Relative: 0 %
HCT: 38.1 % (ref 36.0–46.0)
Hemoglobin: 11.6 g/dL — ABNORMAL LOW (ref 12.0–15.0)
Immature Granulocytes: 3 %
Lymphocytes Relative: 4 %
Lymphs Abs: 0.6 10*3/uL — ABNORMAL LOW (ref 0.7–4.0)
MCH: 30.1 pg (ref 26.0–34.0)
MCHC: 30.4 g/dL (ref 30.0–36.0)
MCV: 98.7 fL (ref 80.0–100.0)
Monocytes Absolute: 0.7 10*3/uL (ref 0.1–1.0)
Monocytes Relative: 4 %
Neutro Abs: 13.3 10*3/uL — ABNORMAL HIGH (ref 1.7–7.7)
Neutrophils Relative %: 89 %
Platelets: 354 10*3/uL (ref 150–400)
RBC: 3.86 MIL/uL — ABNORMAL LOW (ref 3.87–5.11)
RDW: 13.4 % (ref 11.5–15.5)
WBC: 15 10*3/uL — ABNORMAL HIGH (ref 4.0–10.5)
nRBC: 0.4 % — ABNORMAL HIGH (ref 0.0–0.2)

## 2020-02-29 LAB — C-REACTIVE PROTEIN: CRP: 6.1 mg/dL — ABNORMAL HIGH (ref <1.0)

## 2020-02-29 LAB — COMPREHENSIVE METABOLIC PANEL
ALT: 19 U/L (ref 0–44)
AST: 27 U/L (ref 15–41)
Albumin: 2.4 g/dL — ABNORMAL LOW (ref 3.5–5.0)
Alkaline Phosphatase: 63 U/L (ref 38–126)
Anion gap: 11 (ref 5–15)
BUN: 46 mg/dL — ABNORMAL HIGH (ref 8–23)
CO2: 20 mmol/L — ABNORMAL LOW (ref 22–32)
Calcium: 8.2 mg/dL — ABNORMAL LOW (ref 8.9–10.3)
Chloride: 110 mmol/L (ref 98–111)
Creatinine, Ser: 1.64 mg/dL — ABNORMAL HIGH (ref 0.44–1.00)
GFR calc Af Amer: 30 mL/min — ABNORMAL LOW (ref >60)
GFR calc non Af Amer: 26 mL/min — ABNORMAL LOW (ref >60)
Glucose, Bld: 149 mg/dL — ABNORMAL HIGH (ref 70–99)
Potassium: 3.9 mmol/L (ref 3.5–5.1)
Sodium: 141 mmol/L (ref 135–145)
Total Bilirubin: 0.4 mg/dL (ref 0.3–1.2)
Total Protein: 6.1 g/dL — ABNORMAL LOW (ref 6.5–8.1)

## 2020-02-29 LAB — D-DIMER, QUANTITATIVE: D-Dimer, Quant: >20 ug/mL-FEU — ABNORMAL HIGH (ref 0.00–0.50)

## 2020-02-29 LAB — HEPARIN LEVEL (UNFRACTIONATED): Heparin Unfractionated: 0.55 IU/mL (ref 0.30–0.70)

## 2020-02-29 LAB — MAGNESIUM: Magnesium: 2.3 mg/dL (ref 1.7–2.4)

## 2020-02-29 LAB — FERRITIN: Ferritin: 510 ng/mL — ABNORMAL HIGH (ref 11–307)

## 2020-02-29 MED ORDER — APIXABAN 5 MG PO TABS
10.0000 mg | ORAL_TABLET | Freq: Two times a day (BID) | ORAL | Status: DC
  Administered 2020-02-29 – 2020-03-02 (×5): 10 mg via ORAL
  Filled 2020-02-29 (×6): qty 2

## 2020-02-29 MED ORDER — APIXABAN 5 MG PO TABS: 5.0000 mg | ORAL_TABLET | Freq: Two times a day (BID) | ORAL | Status: DC

## 2020-02-29 MED ORDER — HYDRALAZINE HCL 25 MG PO TABS
25.0000 mg | ORAL_TABLET | Freq: Three times a day (TID) | ORAL | Status: DC
  Administered 2020-02-29 – 2020-03-02 (×6): 25 mg via ORAL
  Filled 2020-02-29 (×6): qty 1

## 2020-02-29 MED ORDER — AMLODIPINE BESYLATE 10 MG PO TABS
10.0000 mg | ORAL_TABLET | Freq: Every day | ORAL | Status: DC
  Administered 2020-02-29 – 2020-03-02 (×3): 10 mg via ORAL
  Filled 2020-02-29 (×3): qty 1

## 2020-02-29 NOTE — Evaluation (Signed)
Physical Therapy Evaluation Patient Details Name: Stacy Huang MRN: [id number] DOB: Nov 05, 1920 Today's Date: 02/29/2020   History of Present Illness  86 year old female with history of hypothyroidism presented to ER with dry cough, generalized weakness and dizziness for 2 weeks.  Also complaining about bilateral lower extremity discomfort.She is vaccinated against COVID-19 In the emergency room, afebrile tachypneic and hypertensive.  88% room air requiring 2 L nasal cannula. COVID-19 positive.  Chest x-ray with bilateral interstitial infiltrates.  Clinical Impression  The patient  Is very participatory, requires assistance for safe ambulation. SPO2 at rest on 2 L 95%. Noted dropping to 81 % (finger sensor and pleth waves erratic). Patient instructed in Pursed lip breaths.  Patient reports living with son. Patient currently will require 24/7 assistance . MSW notified who will consult with son re: DC planning. Pt admitted with above diagnosis.   Pt currently with functional limitations due to the deficits listed below (see PT Problem List). Pt will benefit from skilled PT to increase their independence and safety with mobility to allow discharge to the venue listed below.       Follow Up Recommendations SNF;Supervision/Assistance - 24 hour    Equipment Recommendations  Rolling walker with 5" wheels    Recommendations for Other Services       Precautions / Restrictions Precautions Precautions: Fall Precaution Comments: monitor sats Restrictions Weight Bearing Restrictions: No      Mobility  Bed Mobility Overal bed mobility: Needs Assistance Bed Mobility: Supine to Sit     Supine to sit: Min guard        Transfers Overall transfer level: Needs assistance Equipment used: None Transfers: Sit to/from Stand Sit to Stand: Min assist         General transfer comment: multiple attempts to stand from bed, steady assist to power up from bed and  toilet.  Ambulation/Gait Ambulation/Gait assistance: Min assist;+2 safety/equipment Gait Distance (Feet): 20 Feet (x 2 then 50') Assistive device: Rolling walker (2 wheeled);1 person hand held assist;Straight cane Gait Pattern/deviations: Step-to pattern Gait velocity: decr   General Gait Details: patient first ambulatd to BR with cane and HHA with steady assistance, Ambulated again with Rw with improved stability, did require o2 and 2nd person for equip.  Stairs            Wheelchair Mobility    Modified Rankin (Stroke Patients Only)       Balance Overall balance assessment: Needs assistance Sitting-balance support: Single extremity supported;Feet supported Sitting balance-Leahy Scale: Fair     Standing balance support: During functional activity;Bilateral upper extremity supported Standing balance-Leahy Scale: Poor Standing balance comment: reliant on UE support                             Pertinent Vitals/Pain Pain Assessment: No/denies pain    Home Living Family/patient expects to be discharged to:: Private residence Living Arrangements: Children Available Help at Discharge: Family;Available 24 hours/day;Other (Comment) Type of Home: House Home Access: Level entry     Home Layout: One level Home Equipment: Cane - single point Additional Comments: lives with son who is retired    Prior Function Level of Independence: Needs assistance   Gait / Transfers Assistance Needed: used cane in house, needed assistance for bath           Hand Dominance        Extremity/Trunk Assessment        Lower Extremity Assessment Lower Extremity Assessment:  Generalized weakness    Cervical / Trunk Assessment Cervical / Trunk Assessment: Kyphotic  Communication   Communication: HOH  Cognition Arousal/Alertness: Awake/alert Behavior During Therapy: WFL for tasks assessed/performed Overall Cognitive Status: Impaired/Different from baseline Area of  Impairment: Memory;Safety/judgement;Orientation                 Orientation Level: Time;Situation   Memory: Decreased short-term memory         General Comments: follows directions with repetition      General Comments      Exercises     Assessment/Plan    PT Assessment Patient needs continued PT services  PT Problem List Decreased strength;Decreased knowledge of use of DME;Decreased cognition;Decreased activity tolerance;Decreased balance;Decreased knowledge of precautions;Decreased mobility;Cardiopulmonary status limiting activity;Decreased safety awareness       PT Treatment Interventions      PT Goals (Current goals can be found in the Care Plan section)  Acute Rehab PT Goals Patient Stated Goal: wants to get up and  walk PT Goal Formulation: Patient unable to participate in goal setting Time For Goal Achievement: 03/14/20 Potential to Achieve Goals: Good    Frequency Min 3X/week (change to 2x if goes to snf)   Barriers to discharge        Co-evaluation               AM-PAC PT "6 Clicks" Mobility  Outcome Measure Help needed turning from your back to your side while in a flat bed without using bedrails?: A Little Help needed moving from lying on your back to sitting on the side of a flat bed without using bedrails?: A Little Help needed moving to and from a bed to a chair (including a wheelchair)?: A Lot Help needed standing up from a chair using your arms (e.g., wheelchair or bedside chair)?: A Lot Help needed to walk in hospital room?: A Lot Help needed climbing 3-5 steps with a railing? : A Lot 6 Click Score: 14    End of Session Equipment Utilized During Treatment: Gait belt;Oxygen Activity Tolerance: Patient tolerated treatment well Patient left: in chair;with call bell/phone within reach;with chair alarm set Nurse Communication: Mobility status PT Visit Diagnosis: Unsteadiness on feet (R26.81);Muscle weakness (generalized)  (M62.81);History of falling (Z91.81)    Time: 0925-1000 PT Time Calculation (min) (ACUTE ONLY): 35 min   Charges:   PT Evaluation $PT Eval Low Complexity: 1 Low          Blanchard Kelch PT Acute Rehabilitation Services Pager [phone] Office [phone]   Rada Hay 02/29/2020, 10:44 AM

## 2020-02-29 NOTE — TOC Initial Note (Addendum)
Transition of Care [facility]) - Initial/Assessment Note    Patient Details  Name: Stacy Huang MRN: [id number] Date of Birth: 1920/09/24  [facility]) CM/SW Contact:    Ida Rogue, LCSW Phone Number: 02/29/2020, 2:40 PM  Clinical Narrative:  Received heads from Ms [facility], physical therapist, that patient is in need of 24/7 supervision/care at d/c from hospital.  Attempted to call son to find out what he feels capable of providing, and how to proceed.  Left message. TOC will continue to follow during the course of hospitalization.  Addendum:  Son called back.  He is concerned about how weak his mother had become in the last week.  She required help getting out of bed and moved very slowly with her walker.  He is hopeful that she would progress more quickly by going to rehab.  I explained the process.  Bed search initiated.TOC will continue to follow during the course of hospitalization.                    Expected Discharge Plan:  (rehab v home health) Barriers to Discharge: Other (comment) (Unable to make decision without input from son)   Patient Goals and CMS Choice        Expected Discharge Plan and Services Expected Discharge Plan:  (rehab v home health)   Discharge Planning Services: CM Consult   Living arrangements for the past 2 months: Single Family Home                                      Prior Living Arrangements/Services Living arrangements for the past 2 months: Single Family Home Lives with:: Adult Children Patient language and need for interpreter reviewed:: Yes        Need for Family Participation in Patient Care: Yes (Comment) Care giver support system in place?: Yes (comment) Current home services: DME Criminal Activity/Legal Involvement Pertinent to Current Situation/Hospitalization: No - Comment as needed  Activities of Daily Living Home Assistive Devices/Equipment: Cane (specify quad or straight) (straight) ADL Screening  (condition at time of admission) Patient's cognitive ability adequate to safely complete daily activities?: No Is the patient deaf or have difficulty hearing?: Yes Does the patient have difficulty seeing, even when wearing glasses/contacts?: Yes Does the patient have difficulty concentrating, remembering, or making decisions?: Yes Patient able to express need for assistance with ADLs?: Yes Does the patient have difficulty dressing or bathing?: Yes Independently performs ADLs?: No Communication: Independent Dressing (OT): Needs assistance Is this a change from baseline?: Pre-admission baseline Grooming: Needs assistance Is this a change from baseline?: Pre-admission baseline Feeding: Independent Is this a change from baseline?: Pre-admission baseline Bathing: Needs assistance Is this a change from baseline?: Pre-admission baseline Toileting: Needs assistance Is this a change from baseline?: Pre-admission baseline In/Out Bed: Needs assistance Is this a change from baseline?: Pre-admission baseline Walks in Home: Independent with device (comment) Does the patient have difficulty walking or climbing stairs?: Yes Weakness of Legs: Both Weakness of Arms/Hands: Both  Permission Sought/Granted Permission sought to share information with : Family Supports Permission granted to share information with : Yes, Verbal Permission Granted  Share Information with NAME: Donnie Mesa (Son) [phone]           Emotional Assessment Appearance:: Appears stated age Attitude/Demeanor/Rapport: Engaged Affect (typically observed): Appropriate Orientation: : Oriented to Self, Oriented to Place, Oriented to Situation Alcohol / Substance Use:  Not Applicable Psych Involvement: No (comment)  Admission diagnosis:  Acute respiratory failure with hypoxia (HCC) [J96.01] Pneumonia due to COVID-19 virus [U07.1, J12.82] COVID-19 [U07.1] Acute hypoxemic respiratory failure due to COVID-19 (HCC) [U07.1,  J96.01] Patient Active Problem List   Diagnosis Date Noted  . Acute hypoxemic respiratory failure due to COVID-19 (HCC) 02/27/2020  . AKI (acute kidney injury) (HCC) 02/27/2020  . Positive D dimer 02/27/2020  . Hypertensive urgency 02/27/2020  . Generalized weakness 02/27/2020  . Pneumonia due to COVID-19 virus 02/26/2020   PCP:  Doreen Salvage, PA-C Pharmacy:   [facility] - [place], [place] - [address] [place] [address] Phone: [phone] Fax: [phone]     Social Determinants of Health (SDOH) Interventions    Readmission Risk Interventions No flowsheet data found.

## 2020-02-29 NOTE — Progress Notes (Signed)
PROGRESS NOTE    Stacy Huang  ZOX:[id number] DOB: 1921/05/17 DOA: 02/26/2020 PCP: Doreen Salvage, PA-C    Brief Narrative:  86 year old female with history of hypothyroidism presented to ER with dry cough, generalized weakness and dizziness for 2 weeks.  Also complaining about bilateral lower extremity discomfort. She is vaccinated against COVID-19 In the emergency room, afebrile tachypneic and hypertensive.  88% room air requiring 2 L nasal cannula.  CRP 15.  D-dimer more than 20.  COVID-19 positive.  Chest x-ray with bilateral interstitial infiltrates.   Assessment & Plan:   Principal Problem:   Acute hypoxemic respiratory failure due to COVID-19 [facility]) Active Problems:   AKI (acute kidney injury) (HCC)   Positive D dimer   Hypertensive urgency   Generalized weakness  COVID-19 pneumonia with hypoxemia: Continue to monitor due to significant symptoms  chest physiotherapy, incentive spirometry, deep breathing exercises, sputum induction, mucolytic's and bronchodilators. Supplemental oxygen to keep saturations more than 90%. Covid directed therapy with , steroids, on Solu-Medrol remdesivir, day 3/5 Due to severity of symptoms, patient will need daily inflammatory markers, chest x-rays, liver function test to monitor and direct COVID-19 therapies.  COVID-19 Labs  Recent Labs    02/26/20 1620 02/26/20 1621 02/28/20 0549 02/29/20 0535  DDIMER  --  >20.00* >20.00* >20.00*  FERRITIN 468*  --  467* 510*  LDH 234*  --   --   --   CRP 15.3*  --  9.8* 6.1*    Lab Results  Component Value Date   SARSCOV2NAA POSITIVE (A) 02/26/2020   SpO2: 95 % O2 Flow Rate (L/min): 2 L/min  Abnormal kidney function: Available previous kidney function test from care everywhere with creatinine 0.99 in 10/2015.  Probably has progressive kidney disease.  Remains fairly stable.  Hypothyroidism: On Synthroid.  Continue home doses.  Acute DVT right posterior tibial veins, right peroneal  vein/acute DVT left peroneal veins: With D-dimer more than 20.  On heparin.  Will treat with 3 to 6 months of anticoagulation. Treated with heparin.  Will change to Eliquis. May have pulmonary embolism, will not subject patient to contrast dye as it would not change management.  We will anticoagulate.  DVT prophylaxis: Heparin infusion.  Changed to Eliquis today.   Code Status: DNR Family Communication: Son on the phone. Disposition Plan: Status is: Inpatient  Remains inpatient appropriate because:Inpatient level of care appropriate due to severity of illness   Dispo: The patient is from: Home              Anticipated d/c is to: Home              Anticipated d/c date is: 2 to 3 days.              Patient currently is not medically stable to d/c.  Is still on significant oxygen on mobility.    Consultants:   None  Procedures:   None  Antimicrobials:  Anti-infectives (From admission, onward)   Start     Dose/Rate Route Frequency Ordered Stop   02/28/20 1000  remdesivir 100 mg in sodium chloride 0.9 % 100 mL IVPB  Status:  Discontinued       "Followed by" Linked Group Details   100 mg 200 mL/hr over 30 Minutes Intravenous Daily 02/27/20 1400 02/27/20 1403   02/28/20 1000  remdesivir 100 mg in sodium chloride 0.9 % 100 mL IVPB        100 mg 200 mL/hr over 30 Minutes Intravenous Daily 02/27/20  1423 03/02/20 0959   02/27/20 1400  remdesivir 200 mg in sodium chloride 0.9% 250 mL IVPB  Status:  Discontinued       "Followed by" Linked Group Details   200 mg 580 mL/hr over 30 Minutes Intravenous Once 02/27/20 1400 02/27/20 1403   02/27/20 1000  remdesivir 100 mg in sodium chloride 0.9 % 100 mL IVPB  Status:  Discontinued       "Followed by" Linked Group Details   100 mg 200 mL/hr over 30 Minutes Intravenous Daily 02/26/20 1924 02/27/20 1401   02/26/20 2000  remdesivir 200 mg in sodium chloride 0.9% 250 mL IVPB       "Followed by" Linked Group Details   200 mg 580 mL/hr over 30  Minutes Intravenous Once 02/26/20 1924 02/26/20 2020         Subjective: Seen and examined.  No overnight events.  She stated her breathing is better.  He still has some pain on her legs but better than before.  Had difficulty taking breath on mobility.  Objective: Vitals:   02/29/20 0830 02/29/20 1000 02/29/20 1308 02/29/20 1328  BP: (!) 198/67  (!) 168/56   Pulse: (!) 48  (!) 59   Resp: 18  19   Temp: 97.7 F (36.5 C)  98.1 F (36.7 C)   TempSrc: Oral  Oral   SpO2: 99% 92% 94% 95%  Weight:      Height:        Intake/Output Summary (Last 24 hours) at 02/29/2020 1332 Last data filed at 02/29/2020 0944 Gross per 24 hour  Intake 778.56 ml  Output 350 ml  Net 428.56 ml   Filed Weights   02/26/20 1451 02/27/20 1227  Weight: 69.4 kg 63.4 kg    Examination:  General exam: Appears calm and comfortable, appropriate for age.  On 2 L oxygen at rest. Respiratory system: Clear to auscultation. Respiratory effort normal.  No added sounds. Cardiovascular system: S1 & S2 heard, RRR. No JVD, murmurs, rubs, gallops or clicks.  Bilateral trace pedal edema, some tenderness along the right posterior calf. Gastrointestinal system: Abdomen is nondistended, soft and nontender. No organomegaly or masses felt. Normal bowel sounds heard. Central nervous system: Alert and oriented. No focal neurological deficits. Extremities: Symmetric 5 x 5 power. Skin: No rashes, lesions or ulcers Psychiatry: Judgement and insight appear normal. Mood & affect appropriate.     Data Reviewed: I have personally reviewed following labs and imaging studies  CBC: Recent Labs  Lab 02/26/20 1620 02/28/20 0549 02/29/20 0535  WBC 16.4* 15.3* 15.0*  NEUTROABS 14.2* 13.5* 13.3*  HGB 12.6 11.6* 11.6*  HCT 38.9 35.9* 38.1  MCV 91.5 93.7 98.7  PLT 377 338 354   Basic Metabolic Panel: Recent Labs  Lab 02/26/20 1620 02/28/20 0549 02/29/20 0535  NA 139 142 141  K 3.8 4.0 3.9  CL 103 110 110  CO2 23 21*  20*  GLUCOSE 111* 169* 149*  BUN 25* 39* 46*  CREATININE 1.62* 1.52* 1.64*  CALCIUM 8.6* 8.3* 8.2*  MG  --  2.3 2.3   GFR: Estimated Creatinine Clearance: 17.1 mL/min (A) (by C-G formula based on SCr of 1.64 mg/dL (H)). Liver Function Tests: Recent Labs  Lab 02/26/20 1620 02/28/20 0549 02/29/20 0535  AST 20 16 27   ALT 13 13 19   ALKPHOS 74 70 63  BILITOT 0.6 0.4 0.4  PROT 7.6 6.4* 6.1*  ALBUMIN 2.9* 2.6* 2.4*   No results for input(s): LIPASE, AMYLASE in the last  168 hours. No results for input(s): AMMONIA in the last 168 hours. Coagulation Profile: Recent Labs  Lab 02/27/20 1415  INR 1.3*   Cardiac Enzymes: No results for input(s): CKTOTAL, CKMB, CKMBINDEX, TROPONINI in the last 168 hours. BNP (last 3 results) No results for input(s): PROBNP in the last 8760 hours. HbA1C: No results for input(s): HGBA1C in the last 72 hours. CBG: No results for input(s): GLUCAP in the last 168 hours. Lipid Profile: Recent Labs    02/26/20 1620  TRIG 146   Thyroid Function Tests: No results for input(s): TSH, T4TOTAL, FREET4, T3FREE, THYROIDAB in the last 72 hours. Anemia Panel: Recent Labs    02/28/20 0549 02/29/20 0535  FERRITIN 467* 510*   Sepsis Labs: Recent Labs  Lab 02/26/20 1620  PROCALCITON 0.13  LATICACIDVEN 1.3    Recent Results (from the past 240 hour(s))  SARS Coronavirus 2 by RT PCR (hospital order, performed in [facility] hospital lab) Nasopharyngeal Nasopharyngeal Swab     Status: Abnormal   Collection Time: 02/26/20  4:20 PM   Specimen: Nasopharyngeal Swab  Result Value Ref Range Status   SARS Coronavirus 2 POSITIVE (A) NEGATIVE Final    Comment: RESULT CALLED TO, READ BACK BY AND VERIFIED WITH: Solmon Ice RN [phone] PHILLIPS C (NOTE) SARS-CoV-2 target nucleic acids are DETECTED  SARS-CoV-2 RNA is generally detectable in upper respiratory specimens  during the acute phase of infection.  Positive results are indicative  of the presence of the  identified virus, but do not rule out bacterial infection or co-infection with other pathogens not detected by the test.  Clinical correlation with patient history and  other diagnostic information is necessary to determine patient infection status.  The expected result is negative.  Fact Sheet for Patients:   BoilerBrush.com.cy   Fact Sheet for Healthcare Providers:   https://pope.com/    This test is not yet approved or cleared by the [place] FDA and  has been authorized for detection and/or diagnosis of SARS-CoV-2 by FDA under an Emergency Use Authorization (EUA).  This EUA will remain in effect (meaning this te st can be used) for the duration of  the COVID-19 declaration under Section 564(b)(1) of the Act, 21 U.S.C. section 360-bbb-3(b)(1), unless the authorization is terminated or revoked sooner.  Performed at [facility], [address] [place], [place] [address]   Blood Culture (routine x 2)     Status: None (Preliminary result)   Collection Time: 02/26/20  4:21 PM   Specimen: BLOOD  Result Value Ref Range Status   Specimen Description   Final    BLOOD RIGHT ANTECUBITAL Performed at [facility], [address], [place], [place] [address]    Special Requests   Final    BOTTLES DRAWN AEROBIC AND ANAEROBIC Blood Culture adequate volume Performed at [facility], [address] [place], [place] [address]    Culture   Final    NO GROWTH 2 DAYS Performed at [facility], [address] [place], [place] [address]    Report Status PENDING  Incomplete         Radiology Studies: VAS [place] LOWER EXTREMITY VENOUS (DVT)  Result Date: 02/28/2020  Lower Venous DVTStudy Indications: Elevated Ddimer.  Risk Factors: COVID 19 positive. Limitations: Patient positioning and poor ultrasound/tissue interface. Comparison Study: No prior studies. Performing Technologist: Chanda Busing RVT   Examination Guidelines: A complete evaluation includes B-mode imaging, spectral Doppler, color Doppler, and  power Doppler as needed of all accessible portions of each vessel. Bilateral testing is considered an integral part of a complete examination. Limited examinations for reoccurring indications may be performed as noted. The reflux portion of the exam is performed with the patient in reverse Trendelenburg.  +---------+---------------+---------+-----------+----------+--------------+ RIGHT    CompressibilityPhasicitySpontaneityPropertiesThrombus Aging +---------+---------------+---------+-----------+----------+--------------+ CFV      Full           Yes      Yes                                 +---------+---------------+---------+-----------+----------+--------------+ SFJ      Full                                                        +---------+---------------+---------+-----------+----------+--------------+ FV Prox  Full                                                        +---------+---------------+---------+-----------+----------+--------------+ FV Mid   Full                                                        +---------+---------------+---------+-----------+----------+--------------+ FV DistalFull                                                        +---------+---------------+---------+-----------+----------+--------------+ PFV      Full                                                        +---------+---------------+---------+-----------+----------+--------------+ POP      Full           Yes      Yes                                 +---------+---------------+---------+-----------+----------+--------------+ PTV      None                                         Acute          +---------+---------------+---------+-----------+----------+--------------+ PERO     Partial                                      Acute           +---------+---------------+---------+-----------+----------+--------------+ SSV      None  Acute          +---------+---------------+---------+-----------+----------+--------------+   +---------+---------------+---------+-----------+----------+--------------+ LEFT     CompressibilityPhasicitySpontaneityPropertiesThrombus Aging +---------+---------------+---------+-----------+----------+--------------+ CFV      Full           Yes      Yes                                 +---------+---------------+---------+-----------+----------+--------------+ SFJ      Full                                                        +---------+---------------+---------+-----------+----------+--------------+ FV Prox  Full                                                        +---------+---------------+---------+-----------+----------+--------------+ FV Mid   Full                                                        +---------+---------------+---------+-----------+----------+--------------+ FV DistalFull                                                        +---------+---------------+---------+-----------+----------+--------------+ PFV      Full                                                        +---------+---------------+---------+-----------+----------+--------------+ POP      Full           Yes      Yes                                 +---------+---------------+---------+-----------+----------+--------------+ PTV      Full                                                        +---------+---------------+---------+-----------+----------+--------------+ PERO     Partial                                      Acute          +---------+---------------+---------+-----------+----------+--------------+     Summary: RIGHT: - Findings consistent with acute deep vein thrombosis involving the right posterior tibial veins, and  right peroneal veins. - Findings consistent with acute superficial vein thrombosis involving the right small saphenous vein. - Findings suggest  new clot progression as compared to previous examination. - No cystic structure found in the popliteal fossa.  LEFT: - Findings consistent with acute deep vein thrombosis involving the left peroneal veins. - No cystic structure found in the popliteal fossa.  *See table(s) above for measurements and observations. Electronically signed by Lemar LivingsBrandon Cain MD on 02/28/2020 at 11:03:07 AM.    Final         Scheduled Meds: . albuterol  2 puff Inhalation BID  . amLODipine  10 mg Oral Daily  . apixaban  10 mg Oral BID   Followed by  . [START ON 03/06/2020] apixaban  5 mg Oral BID  . hydrALAZINE  25 mg Oral Q8H  . levothyroxine  25 mcg Oral Q0600  . methylPREDNISolone (SOLU-MEDROL) injection  0.5 mg/kg Intravenous Q12H   Followed by  . [START ON 03/01/2020] predniSONE  50 mg Oral Daily   Continuous Infusions: . sodium chloride 10 mL/hr at 02/28/20 0839  . remdesivir 100 mg in NS 100 mL 100 mg (02/29/20 0944)     LOS: 2 days    Time spent: 30 minutes    Dorcas CarrowKuber Chanoch Mccleery, MD [facility] Pager [phone]

## 2020-02-29 NOTE — Progress Notes (Signed)
ANTICOAGULATION CONSULT NOTE  Pharmacy Consult for heparin transition to apixaban Indication: bilateral acute DVT  No Known Allergies  Patient Measurements: Height: 5\' 2"  (157.5 cm) Weight: 63.4 kg (139 lb 12.4 oz) IBW/kg (Calculated) : 50.1 Heparin Dosing Weight: n/a. TBW = 63 kg  Vital Signs: Temp: 97.7 F (36.5 C) (09/20 0830) Temp Source: Oral (09/20 0830) BP: 198/67 (09/20 0830) Pulse Rate: 48 (09/20 0830)  Labs: Recent Labs     0000 02/26/20 1620 02/27/20 1415 02/27/20 2244 02/28/20 0549 02/28/20 1614 02/29/20 0535  HGB   < > 12.6  --   --  11.6*  --  11.6*  HCT  --  38.9  --   --  35.9*  --  38.1  PLT  --  377  --   --  338  --  354  APTT  --   --  63*  --   --   --   --   LABPROT  --   --  15.3*  --   --   --   --   INR  --   --  1.3*  --   --   --   --   HEPARINUNFRC  --   --   --    < > 0.82* 0.56 0.55  CREATININE  --  1.62*  --   --  1.52*  --  1.64*   < > = values in this interval not displayed.    Estimated Creatinine Clearance: 17.1 mL/min (A) (by C-G formula based on SCr of 1.64 mg/dL (H)).   Medical History: Past Medical History:  Diagnosis Date  . Hypertension   . Shoulder fracture, right   . Thyroid disease    Assessment: 63 yoF [facility] with COVID-19 pneumonia, transferred to [facility] on 9/18.  She had an elevated D-dimer, lower extremity venous doppler on 9/18 + bilateral acute DVT. Pt not on anticoagulation PTA. Pt initiated on IV heparin on 9/18, pharmacy consulted to transition to apixaban today.  Today, 02/29/20  HL = 0.55 is therapeutic on heparin infusion of 650 units/hr  Confirmed with RN heparin infusing at correct rate, no signs of bleeding.   CBC: Hgb slightly low but stable; Plt WNL  SCr 1.64, CrCl ~20 mL/min  Goal of Therapy:  Heparin level 0.3-0.7 units/ml Monitor platelets by anticoagulation protocol: Yes   Plan:   Discontinue heparin infusion at the time apixaban initiated. Pt received therapeutic AC  with IV UFH on 9/19  Apixaban 10 mg PO BID x6 days followed by apixaban 5 mg PO BID  CBC q72 hours, follow renal function. Monitor for signs of bleeding.  Pt will need manufacturer coupon prior to discharge  10/19, PharmD 02/29/20 10:18 AM

## 2020-02-29 NOTE — NC FL2 (Signed)
[employer] MEDICAID FL2 LEVEL OF CARE SCREENING TOOL     IDENTIFICATION  Patient Name: Stacy Huang Birthdate: May 07, 1921 Sex: female Admission Date (Current Location): 02/26/2020  [facility] and [place] Number:  Producer, television/film/video and Address:  [facility],  [address] [place] [address]      Provider Number: [id number]  Attending Physician Name and Address:  Dorcas Carrow, MD  Relative Name and Phone Number:  [name] [phone]    Current Level of Care: Hospital Recommended Level of Care: Skilled Nursing Facility Prior Approval Number:    Date Approved/Denied:   PASRR Number: [id number]  Discharge Plan: SNF    Current Diagnoses: Patient Active Problem List   Diagnosis Date Noted  . Acute hypoxemic respiratory failure due to COVID-19 (HCC) 02/27/2020  . AKI (acute kidney injury) (HCC) 02/27/2020  . Positive D dimer 02/27/2020  . Hypertensive urgency 02/27/2020  . Generalized weakness 02/27/2020  . Pneumonia due to COVID-19 virus 02/26/2020    Orientation RESPIRATION BLADDER Height & Weight     Self, Situation, Place  O2 (2L [place]) External catheter Weight: 63.4 kg Height:  5\' 2"  (157.5 cm)  BEHAVIORAL SYMPTOMS/MOOD NEUROLOGICAL BOWEL NUTRITION STATUS   (none)  (none) Continent Diet (see d/c summary)  AMBULATORY STATUS COMMUNICATION OF NEEDS Skin   Extensive Assist Verbally Normal                       Personal Care Assistance Level of Assistance  Bathing, Feeding, Dressing Bathing Assistance: Maximum assistance Feeding assistance: Independent Dressing Assistance: Limited assistance     Functional Limitations Info  Sight, Hearing, Speech Sight Info: Adequate Hearing Info: Adequate Speech Info: Adequate    SPECIAL CARE FACTORS FREQUENCY  PT (By licensed PT), OT (By licensed OT)     PT Frequency: 5X/W OT Frequency: 5X/W            Contractures Contractures Info: Not present    Additional Factors Info  Code  Status, Allergies Code Status Info: DNR Allergies Info: NKA           Current Medications (02/29/2020):  This is the current hospital active medication list Current Facility-Administered Medications  Medication Dose Route Frequency Provider Last Rate Last Admin  . 0.9 %  sodium chloride infusion   Intravenous PRN 03/02/2020, MD 10 mL/hr at 02/28/20 0839 Rate Change at 02/28/20 0839  . acetaminophen (TYLENOL) tablet 650 mg  650 mg Oral Q6H PRN 03/01/20, MD      . albuterol (VENTOLIN HFA) 108 (90 Base) MCG/ACT inhaler 2 puff  2 puff Inhalation BID Jae Dire, MD   2 puff at 02/29/20 1304  . amLODipine (NORVASC) tablet 10 mg  10 mg Oral Daily 03/02/20, MD   10 mg at 02/29/20 0945  . apixaban (ELIQUIS) tablet 10 mg  10 mg Oral BID 03/02/20, RPH   10 mg at 02/29/20 1027   Followed by  . [START ON 03/06/2020] apixaban (ELIQUIS) tablet 5 mg  5 mg Oral BID 03/08/2020, [facility]       . guaiFENesin-dextromethorphan (ROBITUSSIN DM) 100-10 MG/5ML syrup 10 mL  10 mL Oral Q4H PRN [facility], MD      . hydrALAZINE (APRESOLINE) tablet 25 mg  25 mg Oral Q8H Jae Dire, MD   25 mg at 02/29/20 1004  . labetalol (NORMODYNE) injection 10 mg  10 mg Intravenous Q2H PRN 03/02/20, MD  10 mg at 02/29/20 0456  . levothyroxine (SYNTHROID) tablet 25 mcg  25 mcg Oral Q0600 Jae Dire, MD   25 mcg at 02/29/20 [phone]  . methylPREDNISolone sodium succinate (SOLU-MEDROL) 40 mg/mL injection 31.6 mg  0.5 mg/kg Intravenous Q12H Jae Dire, MD   31.6 mg at 02/29/20 0449   Followed by  . [START ON 03/01/2020] predniSONE (DELTASONE) tablet 50 mg  50 mg Oral Daily Jae Dire, MD      . remdesivir 100 mg in sodium chloride 0.9 % 100 mL IVPB  100 mg Intravenous Daily Shade, Jacqulyn Cane, RPH 200 mL/hr at 02/29/20 0944 100 mg at 02/29/20 0944     Discharge Medications: Please see discharge summary for a list of discharge medications.  Relevant Imaging Results:  Relevant Lab  Results:   Additional Information [phone] [place], [place]

## 2020-02-29 NOTE — Discharge Instructions (Signed)
Information on my medicine - ELIQUIS (apixaban)  Why was Eliquis prescribed for you? Eliquis was prescribed to treat blood clots that may have been found in the veins of your legs (deep vein thrombosis) or in your lungs (pulmonary embolism) and to reduce the risk of them occurring again.  What do You need to know about Eliquis ? The starting dose is 10 mg (two 5 mg tablets) taken TWICE daily for the FIRST SEVEN (7) DAYS, then on (enter date)  03/06/20  the dose is reduced to ONE 5 mg tablet taken TWICE daily.  Eliquis may be taken with or without food.   Try to take the dose about the same time in the morning and in the evening. If you have difficulty swallowing the tablet whole please discuss with your pharmacist how to take the medication safely.  Take Eliquis exactly as prescribed and DO NOT stop taking Eliquis without talking to the doctor who prescribed the medication.  Stopping may increase your risk of developing a new blood clot.  Refill your prescription before you run out.  After discharge, you should have regular check-up appointments with your healthcare provider that is prescribing your Eliquis.    What do you do if you miss a dose? If a dose of ELIQUIS is not taken at the scheduled time, take it as soon as possible on the same day and twice-daily administration should be resumed. The dose should not be doubled to make up for a missed dose.  Important Safety Information A possible side effect of Eliquis is bleeding. You should call your healthcare provider right away if you experience any of the following: ? Bleeding from an injury or your nose that does not stop. ? Unusual colored urine (red or dark brown) or unusual colored stools (red or black). ? Unusual bruising for unknown reasons. ? A serious fall or if you hit your head (even if there is no bleeding).  Some medicines may interact with Eliquis and might increase your risk of bleeding or clotting while on  Eliquis. To help avoid this, consult your healthcare provider or pharmacist prior to using any new prescription or non-prescription medications, including herbals, vitamins, non-steroidal anti-inflammatory drugs (NSAIDs) and supplements.  This website has more information on Eliquis (apixaban): http://www.eliquis.com/eliquis/home

## 2020-02-29 NOTE — Progress Notes (Signed)
Patient sitting up in chair. C/o being cold. Provided blankets and turned thermostat in room to 76 degrees. Maintaining 02 sats between 94-96% on 2 LPM oxygen via nasal cannula. Patient is asking if the DR has called her son, I informed her that we gave her sons number to the dr and that the dr will call him when able to. No other complaints at this time. No distress noted.

## 2020-02-29 NOTE — Evaluation (Signed)
Occupational Therapy Evaluation Patient Details Name: Stacy Huang MRN: [id number] DOB: [birth year] Today's Date: 02/29/2020    History of Present Illness 86 year old female with history of hypothyroidism presented to ER with dry cough, generalized weakness and dizziness for 2 weeks.  Also complaining about bilateral lower extremity discomfort.She is vaccinated against COVID-19 In the emergency room, afebrile tachypneic and hypertensive.  88% room air requiring 2 L nasal cannula. COVID-19 positive.  Chest x-ray with bilateral interstitial infiltrates.   Clinical Impression   An occupational therapy evaluation was completed on this 86 year old, right handed,  female.  Patient is currently requiring assistance with ADLs including bathing, toileting, dressing, and standing at a sink for grooming, as well as with functional mobility, which may be below patient's typical baseline at home, however pt is a questionable historian and no family was present.   During this evaluation, patient was limited by decreased activity tolerance, desaturating to 81% after hallway ambulation on 3L via [place], as well as generalized weakness, and mild cognitive impairment, all of which has the potential to impact patient's safety and independence during functional mobility, as well as performance for ADLs. ?[employer] AM-PAC "6-clicks" Daily Activity Inpatient Short Form score of 17/24 indicates a moderate ADL impairment this session. Patient lives with her son, who is able to provide 24/7 supervision and assistance.  Patient demonstrates good rehab potential, and should benefit from continued skilled occupational therapy services while in acute care to maximize safety, independence and quality of life at home.  Continued occupational therapy services in a SNF setting prior to return home is recommended.  ?   Follow Up Recommendations  SNF;Supervision/Assistance - 24 hour    Equipment Recommendations        Recommendations for Other Services       Precautions / Restrictions Precautions Precautions: Fall Precaution Comments: monitor sats Restrictions Weight Bearing Restrictions: No      Mobility Bed Mobility Overal bed mobility: Needs Assistance Bed Mobility: Supine to Sit     Supine to sit: Min guard        Transfers Overall transfer level: Needs assistance Equipment used: Rolling walker (2 wheeled) Transfers: Sit to/from Stand Sit to Stand: Min assist         General transfer comment: multiple attempts to stand from bed, Min assist to power up from EOB on 3rd attempt.    Balance Overall balance assessment: Needs assistance Sitting-balance support: Single extremity supported;Feet supported Sitting balance-Leahy Scale: Fair     Standing balance support: During functional activity;Bilateral upper extremity supported Standing balance-Leahy Scale: Poor Standing balance comment: reliant on UE support with poor ability to maintain safe proximity to walker.                           ADL either performed or assessed with clinical judgement   ADL Overall ADL's : Needs assistance/impaired Eating/Feeding: Modified independent Eating/Feeding Details (indicate cue type and reason): Based on general assessment. Grooming: Cueing for sequencing;Cueing for safety;Min guard;Standing Grooming Details (indicate cue type and reason): Multimodal cues for positioning of RW at sink Upper Body Bathing: Min guard;Sitting Upper Body Bathing Details (indicate cue type and reason): Based on general assessment. Lower Body Bathing: Moderate assistance;Sitting/lateral leans;Sit to/from stand Lower Body Bathing Details (indicate cue type and reason): Based on general assessment. Upper Body Dressing : Minimal assistance Upper Body Dressing Details (indicate cue type and reason): Based on general assessment. Lower Body Dressing: Moderate assistance;Sit to/from  stand Lower Body  Dressing Details (indicate cue type and reason): Assessed during clothing management before/after toileting. Toilet Transfer: Moderate assistance;Cueing for safety;Cueing for sequencing;RW;Ambulation;Regular Teacher, adult education Details (indicate cue type and reason): Moderate assist to stand from standard toilet. Min As to descend onto toilet with use of grab bar and need of cues for motor planning. Toileting- Clothing Manipulation and Hygiene: Moderate assistance;Min guard;Sit to/from stand;Sitting/lateral lean Toileting - Clothing Manipulation Details (indicate cue type and reason): Pt able to perform hygiene with Min guard assist, and required moderate assist for clothing mngt.     Functional mobility during ADLs: Minimal assistance       Vision   Vision Assessment?: No apparent visual deficits Additional Comments: Pt not wearing glasses     Perception     Praxis      Pertinent Vitals/Pain Pain Assessment: No/denies pain     Hand Dominance Right   Extremity/Trunk Assessment Upper Extremity Assessment Upper Extremity Assessment: Generalized weakness   Lower Extremity Assessment Lower Extremity Assessment: Generalized weakness   Cervical / Trunk Assessment Cervical / Trunk Assessment: Kyphotic   Communication Communication Communication: HOH   Cognition Arousal/Alertness: Awake/alert Behavior During Therapy: WFL for tasks assessed/performed Overall Cognitive Status: No family/caregiver present to determine baseline cognitive functioning Area of Impairment: Memory;Safety/judgement;Orientation                 Orientation Level: Time;Situation   Memory: Decreased short-term memory   Safety/Judgement: Decreased awareness of safety     General Comments: follows directions with repetition, easily distracted and needs cues to focus on tasks for safety.   General Comments  Please refer to PT Evaluation for gait in hallway. Min As with added assist for control  of RW and frequent cues for proximity to walker.    Exercises     Shoulder Instructions      Home Living Family/patient expects to be discharged to:: Private residence Living Arrangements: Children Available Help at Discharge: Family;Available 24 hours/day;Other (Comment) (Pt reports her son lives with her and is retired.) Type of Home: House Home Access: Level entry     Home Layout: One level     Bathroom Shower/Tub: Tub/shower unit         Home Equipment: Cane - single point;Shower seat   Additional Comments: lives with son who is retired      Prior Functioning/Environment Level of Independence: Needs Financial planner / Transfers Assistance Needed: used cane in house, needed assistance for bath. ADL's / Homemaking Assistance Needed: Son performs.            OT Problem List: Decreased activity tolerance;Decreased strength;Cardiopulmonary status limiting activity;Decreased safety awareness;Decreased knowledge of use of DME or AE;Impaired balance (sitting and/or standing);Decreased knowledge of precautions      OT Treatment/Interventions: Self-care/ADL training;Therapeutic exercise;Therapeutic activities;Energy conservation;DME and/or AE instruction;Patient/family education;Balance training    OT Goals(Current goals can be found in the care plan section) Acute Rehab OT Goals Patient Stated Goal: To get up and go to the bathroom. OT Goal Formulation: With patient Time For Goal Achievement: 03/14/20 Potential to Achieve Goals: Good ADL Goals Pt Will Perform Grooming: standing;with modified independence Pt Will Perform Upper Body Dressing: with set-up Pt Will Perform Lower Body Dressing: with adaptive equipment;with set-up Pt Will Transfer to Toilet: with supervision;bedside commode;grab bars Pt Will Perform Toileting - Clothing Manipulation and hygiene: with modified independence Additional ADL Goal #1: Pt will perform 2 ADL activities with functional mobility as  needed with SpO2 > or =  to 90% and RPE of no more than 4. Additional ADL Goal #2: Pt will identify at least 3 energy conservation techniques that she can employ at home to optomize function and avoid relapse and rehospitalization.  OT Frequency: Min 2X/week   Barriers to D/C:            Co-evaluation PT/OT/SLP Co-Evaluation/Treatment: Yes Reason for Co-Treatment: Complexity of the patient's impairments (multi-system involvement) PT goals addressed during session: Mobility/safety with mobility OT goals addressed during session: ADL's and self-care;Proper use of Adaptive equipment and DME      AM-PAC OT "6 Clicks" Daily Activity     Outcome Measure Help from another person eating meals?: None Help from another person taking care of personal grooming?: A Little Help from another person toileting, which includes using toliet, bedpan, or urinal?: A Lot Help from another person bathing (including washing, rinsing, drying)?: A Lot Help from another person to put on and taking off regular upper body clothing?: A Little Help from another person to put on and taking off regular lower body clothing?: A Little 6 Click Score: 17   End of Session Equipment Utilized During Treatment: Gait belt;Rolling walker;Oxygen Nurse Communication: Mobility status  Activity Tolerance: Patient limited by fatigue;No increased pain Patient left: in chair;with call bell/phone within reach;with chair alarm set  OT Visit Diagnosis: Unsteadiness on feet (R26.81);Muscle weakness (generalized) (M62.81)                Time: 0925-1000 OT Time Calculation (min): 35 min Charges:  OT General Charges $OT Visit: 1 Visit OT Evaluation $OT Eval Low Complexity: 1 Low  Savvas Roper, OT Acute Rehab Services Office: [phone] 02/29/2020  Theodoro Clock 02/29/2020, 1:24 PM

## 2020-03-01 LAB — COMPREHENSIVE METABOLIC PANEL
ALT: 27 U/L (ref 0–44)
AST: 28 U/L (ref 15–41)
Albumin: 2.6 g/dL — ABNORMAL LOW (ref 3.5–5.0)
Alkaline Phosphatase: 63 U/L (ref 38–126)
Anion gap: 10 (ref 5–15)
BUN: 45 mg/dL — ABNORMAL HIGH (ref 8–23)
CO2: 21 mmol/L — ABNORMAL LOW (ref 22–32)
Calcium: 8.4 mg/dL — ABNORMAL LOW (ref 8.9–10.3)
Chloride: 111 mmol/L (ref 98–111)
Creatinine, Ser: 1.45 mg/dL — ABNORMAL HIGH (ref 0.44–1.00)
GFR calc Af Amer: 35 mL/min — ABNORMAL LOW (ref >60)
GFR calc non Af Amer: 30 mL/min — ABNORMAL LOW (ref >60)
Glucose, Bld: 165 mg/dL — ABNORMAL HIGH (ref 70–99)
Potassium: 3.9 mmol/L (ref 3.5–5.1)
Sodium: 142 mmol/L (ref 135–145)
Total Bilirubin: 0.4 mg/dL (ref 0.3–1.2)
Total Protein: 6.1 g/dL — ABNORMAL LOW (ref 6.5–8.1)

## 2020-03-01 LAB — CBC WITH DIFFERENTIAL/PLATELET
Abs Immature Granulocytes: 0.24 10*3/uL — ABNORMAL HIGH (ref 0.00–0.07)
Basophils Absolute: 0 10*3/uL (ref 0.0–0.1)
Basophils Relative: 0 %
Eosinophils Absolute: 0 10*3/uL (ref 0.0–0.5)
Eosinophils Relative: 0 %
HCT: 36.5 % (ref 36.0–46.0)
Hemoglobin: 11.5 g/dL — ABNORMAL LOW (ref 12.0–15.0)
Immature Granulocytes: 2 %
Lymphocytes Relative: 4 %
Lymphs Abs: 0.4 10*3/uL — ABNORMAL LOW (ref 0.7–4.0)
MCH: 29.4 pg (ref 26.0–34.0)
MCHC: 31.5 g/dL (ref 30.0–36.0)
MCV: 93.4 fL (ref 80.0–100.0)
Monocytes Absolute: 0.5 10*3/uL (ref 0.1–1.0)
Monocytes Relative: 4 %
Neutro Abs: 11.5 10*3/uL — ABNORMAL HIGH (ref 1.7–7.7)
Neutrophils Relative %: 90 %
Platelets: 357 10*3/uL (ref 150–400)
RBC: 3.91 MIL/uL (ref 3.87–5.11)
RDW: 13.3 % (ref 11.5–15.5)
WBC: 12.7 10*3/uL — ABNORMAL HIGH (ref 4.0–10.5)
nRBC: 0 % (ref 0.0–0.2)

## 2020-03-01 LAB — D-DIMER, QUANTITATIVE: D-Dimer, Quant: 17.62 ug/mL-FEU — ABNORMAL HIGH (ref 0.00–0.50)

## 2020-03-01 LAB — FERRITIN: Ferritin: 426 ng/mL — ABNORMAL HIGH (ref 11–307)

## 2020-03-01 LAB — C-REACTIVE PROTEIN: CRP: 3.6 mg/dL — ABNORMAL HIGH (ref <1.0)

## 2020-03-01 LAB — MAGNESIUM: Magnesium: 2.2 mg/dL (ref 1.7–2.4)

## 2020-03-01 NOTE — Care Management Important Message (Signed)
Important Message  Patient Details IM Letter placed in Patients caddy to be presented to the Patient Name: Stacy Huang MRN: [id number] Date of Birth: [birth year]   Medicare Important Message Given:  Yes     Caren Macadam 03/01/2020, 9:52 AM

## 2020-03-01 NOTE — TOC Progression Note (Signed)
Transition of Care [facility]) - Progression Note    Patient Details  Name: Stacy Huang MRN: [id number] Date of Birth: 04-02-1921  Transition of Care [facility]) CM/SW Contact  Ida Rogue, [place] Phone Number: 03/01/2020, 11:25 AM  Clinical Narrative:   Confirmed with patient that she is OK with going to short term rehab. [facility] made a bed offer, have a bed tomorrow.  Will transfer then if still medically stable.  Son alerted. TOC will continue to follow during the course of hospitalization.     Expected Discharge Plan:  (rehab v home health) Barriers to Discharge: Other (comment) (Unable to make decision without input from son)  Expected Discharge Plan and Services Expected Discharge Plan:  (rehab v home health)   Discharge Planning Services: CM Consult   Living arrangements for the past 2 months: Single Family Home                                       Social Determinants of Health (SDOH) Interventions    Readmission Risk Interventions No flowsheet data found.

## 2020-03-01 NOTE — Progress Notes (Signed)
PROGRESS NOTE    Stacy Huang  IRJ:[id number] DOB: 13-Jun-1920 DOA: 02/26/2020 PCP: Doreen Salvage, PA-C    Brief Narrative:  86 year old female with history of hypothyroidism presented to ER with dry cough, generalized weakness and dizziness for 2 weeks.  Also complaining about bilateral lower extremity discomfort. She is vaccinated against COVID-19 In the emergency room, afebrile tachypneic and hypertensive.  88% room air requiring 2 L nasal cannula.  CRP 15.  D-dimer more than 20.  COVID-19 positive.  Chest x-ray with bilateral interstitial infiltrates.   Assessment & Plan:   Principal Problem:   Acute hypoxemic respiratory failure due to COVID-19 [facility]) Active Problems:   AKI (acute kidney injury) (HCC)   Positive D dimer   Hypertensive urgency   Generalized weakness  COVID-19 pneumonia with hypoxemia: Continue to monitor due to significant symptoms  chest physiotherapy, incentive spirometry, deep breathing exercises, sputum induction, mucolytic's and bronchodilators. Supplemental oxygen to keep saturations more than 90%. Covid directed therapy with , steroids, on Solu-Medrol, taper to prednisone. remdesivir, day 4/5 Due to severity of symptoms, patient will need daily inflammatory markers, chest x-rays, liver function test to monitor and direct COVID-19 therapies.  COVID-19 Labs  Recent Labs    02/28/20 0549 02/29/20 0535 03/01/20 0538  DDIMER >20.00* >20.00* 17.62*  FERRITIN 467* 510* 426*  CRP 9.8* 6.1* 3.6*    Lab Results  Component Value Date   SARSCOV2NAA POSITIVE (A) 02/26/2020   SpO2: 100 % O2 Flow Rate (L/min): 2 L/min  Abnormal kidney function: Available previous kidney function test from care everywhere with creatinine 0.99 in 10/2015.  Probably has progressive kidney disease.  Remains fairly stable.  Hypothyroidism: On Synthroid.  Continue home doses.  Acute DVT right posterior tibial veins, right peroneal vein/acute DVT left peroneal veins: With  D-dimer more than 20.  On heparin.  Will treat with 3 to 6 months of anticoagulation. Treated with heparin.   Changed to Eliquis. May have pulmonary embolism, will not subject patient to contrast dye as it would not change management.  We will anticoagulate.  DVT prophylaxis: Eliquis.   Code Status: DNR Family Communication: Son on the phone. Disposition Plan: Status is: Inpatient  Remains inpatient appropriate because:Inpatient level of care appropriate due to severity of illness   Dispo: The patient is from: Home              Anticipated d/c is to: Skilled nursing facility.              Anticipated d/c date is: Advertising account executive.              Patient currently is not medically stable to d/c.   Patient is medically stabilizing.  She will finish her fifth dose of remdesivir tomorrow and potentially can go to a skilled nursing facility to continue inpatient therapies.   Consultants:   None  Procedures:   None  Antimicrobials:  Anti-infectives (From admission, onward)   Start     Dose/Rate Route Frequency Ordered Stop   02/28/20 1000  remdesivir 100 mg in sodium chloride 0.9 % 100 mL IVPB  Status:  Discontinued       "Followed by" Linked Group Details   100 mg 200 mL/hr over 30 Minutes Intravenous Daily 02/27/20 1400 02/27/20 1403   02/28/20 1000  remdesivir 100 mg in sodium chloride 0.9 % 100 mL IVPB        100 mg 200 mL/hr over 30 Minutes Intravenous Daily 02/27/20 1423 03/01/20 0954   02/27/20 1400  remdesivir 200 mg in sodium chloride 0.9% 250 mL IVPB  Status:  Discontinued       "Followed by" Linked Group Details   200 mg 580 mL/hr over 30 Minutes Intravenous Once 02/27/20 1400 02/27/20 1403   02/27/20 1000  remdesivir 100 mg in sodium chloride 0.9 % 100 mL IVPB  Status:  Discontinued       "Followed by" Linked Group Details   100 mg 200 mL/hr over 30 Minutes Intravenous Daily 02/26/20 1924 02/27/20 1401   02/26/20 2000  remdesivir 200 mg in sodium chloride 0.9% 250 mL IVPB        "Followed by" Linked Group Details   200 mg 580 mL/hr over 30 Minutes Intravenous Once 02/26/20 1924 02/26/20 2020         Subjective: Patient seen and examined.  Uneventful night.  Denies any nausea vomiting.  No shortness of breath.  Has some cough.  Leg pain is better.  Objective: Vitals:   02/29/20 2046 03/01/20 0457 03/01/20 0757 03/01/20 1312  BP: (!) 146/81 (!) 172/75 (!) 158/62 (!) 153/109  Pulse: (!) 59 (!) 58 (!) 53 64  Resp: 18 16  15   Temp: 97.7 F (36.5 C) 98.8 F (37.1 C)  97.6 F (36.4 C)  TempSrc: Oral Oral  Oral  SpO2: 97% 100%  100%  Weight:      Height:        Intake/Output Summary (Last 24 hours) at 03/01/2020 1329 Last data filed at 03/01/2020 1329 Gross per 24 hour  Intake 840 ml  Output 200 ml  Net 640 ml   Filed Weights   02/26/20 1451 02/27/20 1227  Weight: 69.4 kg 63.4 kg    Examination:  General exam: Appears calm and comfortable, appropriate for age.  On 2 L oxygen at rest and saturating 100%.  Some difficulty with mobility. Respiratory system: Clear to auscultation. Respiratory effort normal.  No added sounds. Cardiovascular system: S1 & S2 heard, RRR. No JVD, murmurs, rubs, gallops or clicks.  Bilateral trace pedal edema, nontender. Gastrointestinal system: Abdomen is nondistended, soft and nontender. No organomegaly or masses felt. Normal bowel sounds heard. Central nervous system: Alert and oriented. No focal neurological deficits. Extremities: Symmetric 5 x 5 power. Skin: No rashes, lesions or ulcers Psychiatry: Judgement and insight appear normal. Mood & affect appropriate.     Data Reviewed: I have personally reviewed following labs and imaging studies  CBC: Recent Labs  Lab 02/26/20 1620 02/28/20 0549 02/29/20 0535 03/01/20 0538  WBC 16.4* 15.3* 15.0* 12.7*  NEUTROABS 14.2* 13.5* 13.3* 11.5*  HGB 12.6 11.6* 11.6* 11.5*  HCT 38.9 35.9* 38.1 36.5  MCV 91.5 93.7 98.7 93.4  PLT 377 338 354 357   Basic Metabolic  Panel: Recent Labs  Lab 02/26/20 1620 02/28/20 0549 02/29/20 0535 03/01/20 0538  NA 139 142 141 142  K 3.8 4.0 3.9 3.9  CL 103 110 110 111  CO2 23 21* 20* 21*  GLUCOSE 111* 169* 149* 165*  BUN 25* 39* 46* 45*  CREATININE 1.62* 1.52* 1.64* 1.45*  CALCIUM 8.6* 8.3* 8.2* 8.4*  MG  --  2.3 2.3 2.2   GFR: Estimated Creatinine Clearance: 19.4 mL/min (A) (by C-G formula based on SCr of 1.45 mg/dL (H)). Liver Function Tests: Recent Labs  Lab 02/26/20 1620 02/28/20 0549 02/29/20 0535 03/01/20 0538  AST 20 16 27 28   ALT 13 13 19 27   ALKPHOS 74 70 63 63  BILITOT 0.6 0.4 0.4 0.4  PROT 7.6 6.4*  6.1* 6.1*  ALBUMIN 2.9* 2.6* 2.4* 2.6*   No results for input(s): LIPASE, AMYLASE in the last 168 hours. No results for input(s): AMMONIA in the last 168 hours. Coagulation Profile: Recent Labs  Lab 02/27/20 1415  INR 1.3*   Cardiac Enzymes: No results for input(s): CKTOTAL, CKMB, CKMBINDEX, TROPONINI in the last 168 hours. BNP (last 3 results) No results for input(s): PROBNP in the last 8760 hours. HbA1C: No results for input(s): HGBA1C in the last 72 hours. CBG: No results for input(s): GLUCAP in the last 168 hours. Lipid Profile: No results for input(s): CHOL, HDL, LDLCALC, TRIG, CHOLHDL, LDLDIRECT in the last 72 hours. Thyroid Function Tests: No results for input(s): TSH, T4TOTAL, FREET4, T3FREE, THYROIDAB in the last 72 hours. Anemia Panel: Recent Labs    02/29/20 0535 03/01/20 0538  FERRITIN 510* 426*   Sepsis Labs: Recent Labs  Lab 02/26/20 1620  PROCALCITON 0.13  LATICACIDVEN 1.3    Recent Results (from the past 240 hour(s))  SARS Coronavirus 2 by RT PCR (hospital order, performed in [facility] hospital lab) Nasopharyngeal Nasopharyngeal Swab     Status: Abnormal   Collection Time: 02/26/20  4:20 PM   Specimen: Nasopharyngeal Swab  Result Value Ref Range Status   SARS Coronavirus 2 POSITIVE (A) NEGATIVE Final    Comment: RESULT CALLED TO, READ BACK BY AND  VERIFIED WITH: Solmon Ice RN [phone] PHILLIPS C (NOTE) SARS-CoV-2 target nucleic acids are DETECTED  SARS-CoV-2 RNA is generally detectable in upper respiratory specimens  during the acute phase of infection.  Positive results are indicative  of the presence of the identified virus, but do not rule out bacterial infection or co-infection with other pathogens not detected by the test.  Clinical correlation with patient history and  other diagnostic information is necessary to determine patient infection status.  The expected result is negative.  Fact Sheet for Patients:   BoilerBrush.com.cy   Fact Sheet for Healthcare Providers:   https://pope.com/    This test is not yet approved or cleared by the [place] FDA and  has been authorized for detection and/or diagnosis of SARS-CoV-2 by FDA under an Emergency Use Authorization (EUA).  This EUA will remain in effect (meaning this te st can be used) for the duration of  the COVID-19 declaration under Section 564(b)(1) of the Act, 21 U.S.C. section 360-bbb-3(b)(1), unless the authorization is terminated or revoked sooner.  Performed at [facility], [address] [place], [place] [address]   Blood Culture (routine x 2)     Status: None (Preliminary result)   Collection Time: 02/26/20  4:21 PM   Specimen: BLOOD  Result Value Ref Range Status   Specimen Description   Final    BLOOD RIGHT ANTECUBITAL Performed at [facility], [address], [place], [place] [address]    Special Requests   Final    BOTTLES DRAWN AEROBIC AND ANAEROBIC Blood Culture adequate volume Performed at [facility], [address] [place], [place] [address]    Culture   Final    NO GROWTH 4 DAYS Performed at [facility], [address], [place], [place] [address]    Report Status PENDING  Incomplete         Radiology Studies: No results  found.      Scheduled Meds: . albuterol  2 puff Inhalation BID  . amLODipine  10 mg Oral Daily  . apixaban  10 mg Oral  BID   Followed by  . [START ON 03/06/2020] apixaban  5 mg Oral BID  . hydrALAZINE  25 mg Oral Q8H  . levothyroxine  25 mcg Oral Q0600  . predniSONE  50 mg Oral Daily   Continuous Infusions: . sodium chloride Stopped (03/01/20 0400)     LOS: 3 days    Time spent: 30 minutes    Dorcas Carrow, MD [facility] Pager [phone]

## 2020-03-02 DIAGNOSIS — N183 Chronic kidney disease, stage 3 unspecified: Secondary | ICD-10-CM

## 2020-03-02 DIAGNOSIS — N1832 Chronic kidney disease, stage 3b: Secondary | ICD-10-CM

## 2020-03-02 LAB — CBC WITH DIFFERENTIAL/PLATELET
Abs Immature Granulocytes: 0.25 10*3/uL — ABNORMAL HIGH (ref 0.00–0.07)
Basophils Absolute: 0 10*3/uL (ref 0.0–0.1)
Basophils Relative: 0 %
Eosinophils Absolute: 0 10*3/uL (ref 0.0–0.5)
Eosinophils Relative: 0 %
HCT: 36.6 % (ref 36.0–46.0)
Hemoglobin: 11.7 g/dL — ABNORMAL LOW (ref 12.0–15.0)
Immature Granulocytes: 2 %
Lymphocytes Relative: 4 %
Lymphs Abs: 0.5 10*3/uL — ABNORMAL LOW (ref 0.7–4.0)
MCH: 29.8 pg (ref 26.0–34.0)
MCHC: 32 g/dL (ref 30.0–36.0)
MCV: 93.1 fL (ref 80.0–100.0)
Monocytes Absolute: 0.5 10*3/uL (ref 0.1–1.0)
Monocytes Relative: 4 %
Neutro Abs: 10.5 10*3/uL — ABNORMAL HIGH (ref 1.7–7.7)
Neutrophils Relative %: 90 %
Platelets: 381 10*3/uL (ref 150–400)
RBC: 3.93 MIL/uL (ref 3.87–5.11)
RDW: 13.3 % (ref 11.5–15.5)
WBC: 11.8 10*3/uL — ABNORMAL HIGH (ref 4.0–10.5)
nRBC: 0 % (ref 0.0–0.2)

## 2020-03-02 LAB — COMPREHENSIVE METABOLIC PANEL
ALT: 33 U/L (ref 0–44)
AST: 26 U/L (ref 15–41)
Albumin: 2.6 g/dL — ABNORMAL LOW (ref 3.5–5.0)
Alkaline Phosphatase: 57 U/L (ref 38–126)
Anion gap: 8 (ref 5–15)
BUN: 47 mg/dL — ABNORMAL HIGH (ref 8–23)
CO2: 21 mmol/L — ABNORMAL LOW (ref 22–32)
Calcium: 8.1 mg/dL — ABNORMAL LOW (ref 8.9–10.3)
Chloride: 109 mmol/L (ref 98–111)
Creatinine, Ser: 1.5 mg/dL — ABNORMAL HIGH (ref 0.44–1.00)
GFR calc Af Amer: 33 mL/min — ABNORMAL LOW (ref >60)
GFR calc non Af Amer: 29 mL/min — ABNORMAL LOW (ref >60)
Glucose, Bld: 184 mg/dL — ABNORMAL HIGH (ref 70–99)
Potassium: 4.2 mmol/L (ref 3.5–5.1)
Sodium: 138 mmol/L (ref 135–145)
Total Bilirubin: 0.5 mg/dL (ref 0.3–1.2)
Total Protein: 6.1 g/dL — ABNORMAL LOW (ref 6.5–8.1)

## 2020-03-02 LAB — FERRITIN: Ferritin: 422 ng/mL — ABNORMAL HIGH (ref 11–307)

## 2020-03-02 LAB — CULTURE, BLOOD (ROUTINE X 2)
Culture: NO GROWTH
Special Requests: ADEQUATE

## 2020-03-02 LAB — MAGNESIUM: Magnesium: 2.4 mg/dL (ref 1.7–2.4)

## 2020-03-02 LAB — C-REACTIVE PROTEIN: CRP: 2.3 mg/dL — ABNORMAL HIGH (ref <1.0)

## 2020-03-02 LAB — D-DIMER, QUANTITATIVE: D-Dimer, Quant: 14.39 ug/mL-FEU — ABNORMAL HIGH (ref 0.00–0.50)

## 2020-03-02 MED ORDER — GUAIFENESIN-DM 100-10 MG/5ML PO SYRP: 5.0000 mL | ORAL_SOLUTION | Freq: Four times a day (QID) | ORAL | 0 refills | Status: DC | PRN

## 2020-03-02 MED ORDER — ACETAMINOPHEN 325 MG PO TABS: 650.0000 mg | ORAL_TABLET | Freq: Four times a day (QID) | ORAL | Status: DC | PRN

## 2020-03-02 MED ORDER — APIXABAN 5 MG PO TABS: ORAL_TABLET | ORAL | 0 refills | Status: DC

## 2020-03-02 MED ORDER — LEVOTHYROXINE SODIUM 25 MCG PO TABS: 25.0000 ug | ORAL_TABLET | Freq: Every day | ORAL | 0 refills | Status: DC

## 2020-03-02 MED FILL — SM TUSSIN DM SYRUP: 100-10 | 6 days supply | Qty: 118 | Fill #0

## 2020-03-02 MED FILL — ELIQUIS 5 MG TABLET: 5 | 30 days supply | Qty: 60 | Fill #0

## 2020-03-02 MED FILL — LEVOTHYROXINE SODIUM 25 MCG: 25 | 30 days supply | Qty: 30 | Fill #0

## 2020-03-02 NOTE — Progress Notes (Signed)
Called [facility] (x2) to give report. Unable to give report due to no answer from receiving nurse.  Will try again.

## 2020-03-02 NOTE — TOC Transition Note (Signed)
Transition of Care [facility]) - CM/SW Discharge Note   Patient Details  Name: MELIDA NORTHINGTON MRN: [id number] Date of Birth: July 31, 1920  [facility] CM/SW Contact:  Ida Rogue, LCSW Phone Number: 03/02/2020, 12:13 PM   Clinical Narrative:  Patient will transition to [facility] today.  Family alerted.  PTAR arranged. [employer] reference [id number].  Nursing, please call report to [phone], room 806A. TOC sign off.     Final next level of care: Skilled Nursing Facility Barriers to Discharge: Barriers Resolved   Patient Goals and CMS Choice        Discharge Placement                       Discharge Plan and Services   Discharge Planning Services: CM Consult                                 Social Determinants of Health (SDOH) Interventions     Readmission Risk Interventions No flowsheet data found.

## 2020-03-02 NOTE — Progress Notes (Signed)
Report given to Reggie RN at [facility]. All questions answered. Informed Reggie that patients Eliquis is in pt's D/C packet and to be given to pt's son Stephannie Peters. Pt belongings at bedside. IV access removed and dressing applied. AVS printed and inside pt's discharge packet.  Awaiting transport by PTAR.

## 2020-03-02 NOTE — Discharge Summary (Addendum)
Physician Discharge Summary  Stacy Huang ZVJ:[id number] DOB: 07/23/1920 DOA: 02/26/2020  PCP: Doreen Salvage, PA-C  Admit date: 02/26/2020 Discharge date: 03/02/2020  Admitted From: Home  Disposition:  SNF   Recommendations for Outpatient Follow-up and new medication changes:  1. Follow up with Doreen Salvage PA-C in 7 days.  2. Patient placed on apixaban for bilateral lower extremity DVT, plan 6 months of therapy. 3. Continue quarantine for 2 weeks, maintain physical distancing and use a mask in public.   Home Health: na   Equipment/Devices: na    Discharge Condition: stable  CODE STATUS: DNR   Diet recommendation:  Hearth healthy   Brief/Interim Summary: Patient admitted to the hospital with the working diagnosis of acute hypoxic respiratory failure due to SARS COVID-19 viral pneumonia  86 year old female with past medical history for hypertension and hypothyroidism, who reported 2 weeks of dry cough, generalized weakness and dizziness.  Symptoms associated with loss of taste and bilateral extremity discomfort.  No chest pain.  She had been fully vaccinated for COVID-19, April 2021.  On her initial physical examination she was afebrile, tachypneic and hypertensive, her oxygenation was 88% on room air.  Her lungs were clear to auscultation bilaterally, heart S1-S2, present rhythm, soft abdomen, she had trace edema at the ankles with positive calf tenderness. Sodium 139, potassium 3.8, chloride 103, bicarb 23, glucose 111, BUN 25, creatinine 1.65, AST 20, ALT 13, white count 16.4, hemoglobin 12.6, hematocrit 38.9, platelets 377.  D-dimer greater than 20.  SARS COVID-19 positive.  Chest radiograph with bilateral, alveolar/essential infiltrates, lower lobes, right/left, faint interstitial infiltrate right upper lobe.  EKG 88 bpm, normal axis, normal intervals, sinus rhythm, no ST segment or T wave changes.  Patient received supplemental oxygen per nasal cannula, systemic steroids and remdesivir  with good toleration.  She was diagnosed with acute deep vein thrombosis bilateral lower extremities and started on anticoagulation.  Physical therapy/Occupational Therapy evaluation recommended further care at the skilled nursing facility.   1.  Acute hypoxic respiratory failure due to SARS COVID-19 viral pneumonia.  Patient responded well to medical therapy with systemic steroids and remdesivir, her symptoms, oxygen requirements and inflammatory markers improved. At discharge her oxygenation is 94 to 100% on 2 L per nasal cannula.  COVID-19 Labs  Recent Labs    02/29/20 0535 03/01/20 0538 03/02/20 0444  DDIMER >20.00* 17.62* 14.39*  FERRITIN 510* 426* 422*  CRP 6.1* 3.6* 2.3*    Lab Results  Component Value Date   SARSCOV2NAA POSITIVE (A) 02/26/2020    2.  Acute deep vein thrombosis right posterior tibial vein, right peroneal vein, left peroneal veins.  Acute superficial vein thrombosis involving the right small saphenous vein.  Patient had elevated dimer, further work-up revealed bilateral deep vein thrombosis and right superficial vein thrombosis of the lower extremities. Patient has been placed on anticoagulation with good response.  At the time of her discharge D-dimer is trending down.  3. CKD stage 3b.  Her kidney function has remained stable with a serum creatinine between 1.5 to 1.6. Acute kidney injury ruled out.  At discharge sodium 138, potassium 4.2, chloride 109, bicarb 21, glucose 184, BUN 47, creatinine 1.5.  4. HTN. Continue blood pressure control with amlodipine and losartan.  5. Hypothyroid. Continue with levothyroxine.    Discharge Diagnoses:  Principal Problem:   Acute hypoxemic respiratory failure due to COVID-19 [facility]) Active Problems:   Positive D dimer   Hypertensive urgency   Generalized weakness   CKD (chronic kidney  disease) stage 3, GFR 30-59 ml/min     Discharge Instructions   Allergies as of 03/02/2020   No Known Allergies      Medication List    TAKE these medications   acetaminophen 325 MG tablet Commonly known as: TYLENOL Take 2 tablets (650 mg total) by mouth every 6 (six) hours as needed for mild pain or headache (fever >/= 101).   amLODipine 10 MG tablet Commonly known as: NORVASC Take 10 mg by mouth daily.   apixaban 5 MG Tabs tablet Commonly known as: ELIQUIS Take 2 tablets twice daily until 03/05/20, then continue with 1 tablet twice daily starting 03/06/20.   guaiFENesin-dextromethorphan 100-10 MG/5ML syrup Commonly known as: ROBITUSSIN DM Take 5 mLs by mouth every 6 (six) hours as needed for cough.   levothyroxine 25 MCG tablet Commonly known as: Synthroid Take 1 tablet (25 mcg total) by mouth daily at 6 (six) AM. Start taking on: March 03, 2020 What changed:   medication strength  how much to take  when to take this   losartan 25 MG tablet Commonly known as: COZAAR Take 25 mg by mouth daily.   meclizine 25 MG tablet Commonly known as: ANTIVERT Take 25 mg by mouth daily as needed for dizziness.       No Known Allergies      Procedures/Studies: DG Chest Portable 1 View  Result Date: 02/26/2020 CLINICAL DATA:  Weakness, oxygen saturation of 88% on room air EXAM: PORTABLE CHEST 1 VIEW COMPARISON:  CT 02/19/2008 FINDINGS: Mixed interstitial and more patchy basilar and peripheral airspace opacities bilaterally. No pneumothorax or visible effusion. The aorta is calcified. The remaining cardiomediastinal contours are unremarkable. No acute osseous or soft tissue abnormality. Likely geode formation of the left humeral head. Degenerative changes are present in the imaged spine and shoulders. Nasal cannula overlies the chest. IMPRESSION: Mixed interstitial and more patchy peripheral basilar and peripheral airspace opacities bilaterally. Findings are concerning for a multifocal infection including atypical infection including such as COVID 19. Otherwise, and mixed interstitial and  alveolar edema could present similarly. Electronically Signed   By: Kreg Shropshire M.D.   On: 02/26/2020 15:19   VAS [place] LOWER EXTREMITY VENOUS (DVT)  Result Date: 02/28/2020  Lower Venous DVTStudy Indications: Elevated Ddimer.  Risk Factors: COVID 19 positive. Limitations: Patient positioning and poor ultrasound/tissue interface. Comparison Study: No prior studies. Performing Technologist: Chanda Busing RVT  Examination Guidelines: A complete evaluation includes B-mode imaging, spectral Doppler, color Doppler, and power Doppler as needed of all accessible portions of each vessel. Bilateral testing is considered an integral part of a complete examination. Limited examinations for reoccurring indications may be performed as noted. The reflux portion of the exam is performed with the patient in reverse Trendelenburg.  +---------+---------------+---------+-----------+----------+--------------+  RIGHT     Compressibility Phasicity Spontaneity Properties Thrombus Aging  +---------+---------------+---------+-----------+----------+--------------+  CFV       Full            Yes       Yes                                    +---------+---------------+---------+-----------+----------+--------------+  SFJ       Full                                                             +---------+---------------+---------+-----------+----------+--------------+  FV Prox   Full                                                             +---------+---------------+---------+-----------+----------+--------------+  FV Mid    Full                                                             +---------+---------------+---------+-----------+----------+--------------+  FV Distal Full                                                             +---------+---------------+---------+-----------+----------+--------------+  PFV       Full                                                              +---------+---------------+---------+-----------+----------+--------------+  POP       Full            Yes       Yes                                    +---------+---------------+---------+-----------+----------+--------------+  PTV       None                                             Acute           +---------+---------------+---------+-----------+----------+--------------+  PERO      Partial                                          Acute           +---------+---------------+---------+-----------+----------+--------------+  SSV       None                                             Acute           +---------+---------------+---------+-----------+----------+--------------+   +---------+---------------+---------+-----------+----------+--------------+  LEFT      Compressibility Phasicity Spontaneity Properties Thrombus Aging  +---------+---------------+---------+-----------+----------+--------------+  CFV       Full            Yes       Yes                                    +---------+---------------+---------+-----------+----------+--------------+  SFJ       Full                                                             +---------+---------------+---------+-----------+----------+--------------+  FV Prox   Full                                                             +---------+---------------+---------+-----------+----------+--------------+  FV Mid    Full                                                             +---------+---------------+---------+-----------+----------+--------------+  FV Distal Full                                                             +---------+---------------+---------+-----------+----------+--------------+  PFV       Full                                                             +---------+---------------+---------+-----------+----------+--------------+  POP       Full            Yes       Yes                                     +---------+---------------+---------+-----------+----------+--------------+  PTV       Full                                                             +---------+---------------+---------+-----------+----------+--------------+  PERO      Partial                                          Acute           +---------+---------------+---------+-----------+----------+--------------+     Summary: RIGHT: - Findings consistent with acute deep vein thrombosis involving the right posterior tibial veins, and right peroneal veins. - Findings consistent with acute superficial vein thrombosis involving the right small saphenous vein. - Findings suggest new clot progression as compared to previous examination. - No cystic structure found in the popliteal fossa.  LEFT: - Findings consistent with acute  deep vein thrombosis involving the left peroneal veins. - No cystic structure found in the popliteal fossa.  *See table(s) above for measurements and observations. Electronically signed by Lemar Livings MD on 02/28/2020 at 11:03:07 AM.    Final        Subjective: Patient feeling better, dyspnea has improved but continue to be very weak and deconditioned, positive mild pain on the right lower extremity.   Discharge Exam: Vitals:   03/02/20 0427 03/02/20 0756  BP: 102/81 (!) 156/58  Pulse: (!) 59 (!) 58  Resp: 19   Temp: 97.7 F (36.5 C)   SpO2: 100%    Vitals:   03/01/20 1312 03/01/20 2044 03/02/20 0427 03/02/20 0756  BP: (!) 153/109 (!) 169/67 102/81 (!) 156/58  Pulse: 64 62 (!) 59 (!) 58  Resp: 15 20 19    Temp: 97.6 F (36.4 C) 98.4 F (36.9 C) 97.7 F (36.5 C)   TempSrc: Oral     SpO2: 100% 98% 100%   Weight:      Height:        General: Not in pain or dyspnea,. Neurology: Awake and alert, non focal  E ENT: no pallor, no icterus, oral mucosa moist Cardiovascular: No JVD. S1-S2 present, rhythmic, no gallops, rubs, or murmurs. Trace bilateral non pitting lower extremity edema. Pulmonary: positive  breath sounds bilaterally,  Gastrointestinal. Abdomen soft and non tender Skin. No rashes Musculoskeletal: no joint deformities   The results of significant diagnostics from this hospitalization (including imaging, microbiology, ancillary and laboratory) are listed below for reference.     Microbiology: Recent Results (from the past 240 hour(s))  SARS Coronavirus 2 by RT PCR (hospital order, performed in [facility] hospital lab) Nasopharyngeal Nasopharyngeal Swab     Status: Abnormal   Collection Time: 02/26/20  4:20 PM   Specimen: Nasopharyngeal Swab  Result Value Ref Range Status   SARS Coronavirus 2 POSITIVE (A) NEGATIVE Final    Comment: RESULT CALLED TO, READ BACK BY AND VERIFIED WITH: 02/28/20 RN [phone] PHILLIPS C (NOTE) SARS-CoV-2 target nucleic acids are DETECTED  SARS-CoV-2 RNA is generally detectable in upper respiratory specimens  during the acute phase of infection.  Positive results are indicative  of the presence of the identified virus, but do not rule out bacterial infection or co-infection with other pathogens not detected by the test.  Clinical correlation with patient history and  other diagnostic information is necessary to determine patient infection status.  The expected result is negative.  Fact Sheet for Patients:   3149 702637   Fact Sheet for Healthcare Providers:   BoilerBrush.com.cy    This test is not yet approved or cleared by the https://pope.com/ FDA and  has been authorized for detection and/or diagnosis of SARS-CoV-2 by FDA under an Emergency Use Authorization (EUA).  This EUA will remain in effect (meaning this te st can be used) for the duration of  the COVID-19 declaration under Section 564(b)(1) of the Act, 21 U.S.C. section 360-bbb-3(b)(1), unless the authorization is terminated or revoked sooner.  Performed at [facility], [address] [place], [place]    Blood Culture (routine x 2)     Status: None (Preliminary result)   Collection Time: 02/26/20  4:21 PM   Specimen: BLOOD  Result Value Ref Range Status   Specimen Description   Final    BLOOD RIGHT ANTECUBITAL Performed at [facility], [address] [place], [place]    Special  Requests   Final    BOTTLES DRAWN AEROBIC AND ANAEROBIC Blood Culture adequate volume Performed at [facility] [place], [address] [place], [place] [address]    Culture   Final    NO GROWTH 4 DAYS Performed at [facility], [address] [place], [place] [address]    Report Status PENDING  Incomplete     Labs: BNP (last 3 results) No results for input(s): BNP in the last 8760 hours. Basic Metabolic Panel: Recent Labs  Lab 02/26/20 1620 02/28/20 0549 02/29/20 0535 03/01/20 0538 03/02/20 0444  NA 139 142 141 142 138  K 3.8 4.0 3.9 3.9 4.2  CL 103 110 110 111 109  CO2 23 21* 20* 21* 21*  GLUCOSE 111* 169* 149* 165* 184*  BUN 25* 39* 46* 45* 47*  CREATININE 1.62* 1.52* 1.64* 1.45* 1.50*  CALCIUM 8.6* 8.3* 8.2* 8.4* 8.1*  MG  --  2.3 2.3 2.2 2.4   Liver Function Tests: Recent Labs  Lab 02/26/20 1620 02/28/20 0549 02/29/20 0535 03/01/20 0538 03/02/20 0444  AST 20 16 27 28 26   ALT 13 13 19 27  33  ALKPHOS 74 70 63 63 57  BILITOT 0.6 0.4 0.4 0.4 0.5  PROT 7.6 6.4* 6.1* 6.1* 6.1*  ALBUMIN 2.9* 2.6* 2.4* 2.6* 2.6*   No results for input(s): LIPASE, AMYLASE in the last 168 hours. No results for input(s): AMMONIA in the last 168 hours. CBC: Recent Labs  Lab 02/26/20 1620 02/28/20 0549 02/29/20 0535 03/01/20 0538 03/02/20 0444  WBC 16.4* 15.3* 15.0* 12.7* 11.8*  NEUTROABS 14.2* 13.5* 13.3* 11.5* 10.5*  HGB 12.6 11.6* 11.6* 11.5* 11.7*  HCT 38.9 35.9* 38.1 36.5 36.6  MCV 91.5 93.7 98.7 93.4 93.1  PLT 377 338 354 357 381   Cardiac Enzymes: No results for input(s): CKTOTAL, CKMB, CKMBINDEX, TROPONINI in the last 168 hours. BNP: Invalid input(s):  POCBNP CBG: No results for input(s): GLUCAP in the last 168 hours. D-Dimer Recent Labs    03/01/20 0538 03/02/20 0444  DDIMER 17.62* 14.39*   Hgb A1c No results for input(s): HGBA1C in the last 72 hours. Lipid Profile No results for input(s): CHOL, HDL, LDLCALC, TRIG, CHOLHDL, LDLDIRECT in the last 72 hours. Thyroid function studies No results for input(s): TSH, T4TOTAL, T3FREE, THYROIDAB in the last 72 hours.  Invalid input(s): FREET3 Anemia work up [facility]    03/01/20 0538 03/02/20 0444  FERRITIN 426* 422*   Urinalysis No results found for: COLORURINE, APPEARANCEUR, LABSPEC, PHURINE, GLUCOSEU, HGBUR, BILIRUBINUR, KETONESUR, PROTEINUR, UROBILINOGEN, NITRITE, LEUKOCYTESUR Sepsis Labs Invalid input(s): PROCALCITONIN,  WBC,  LACTICIDVEN Microbiology Recent Results (from the past 240 hour(s))  SARS Coronavirus 2 by RT PCR (hospital order, performed in [facility] hospital lab) Nasopharyngeal Nasopharyngeal Swab     Status: Abnormal   Collection Time: 02/26/20  4:20 PM   Specimen: Nasopharyngeal Swab  Result Value Ref Range Status   SARS Coronavirus 2 POSITIVE (A) NEGATIVE Final    Comment: RESULT CALLED TO, READ BACK BY AND VERIFIED WITH: Solmon IceBAILEY J RN [phone] PHILLIPS C (NOTE) SARS-CoV-2 target nucleic acids are DETECTED  SARS-CoV-2 RNA is generally detectable in upper respiratory specimens  during the acute phase of infection.  Positive results are indicative  of the presence of the identified virus, but do not rule out bacterial infection or co-infection with other pathogens not detected by the test.  Clinical correlation with patient history and  other diagnostic information is necessary to determine patient infection  status.  The expected result is negative.  Fact Sheet for Patients:   BoilerBrush.com.cy   Fact Sheet for Healthcare Providers:   https://pope.com/    This test is not yet approved or cleared by  the [place] FDA and  has been authorized for detection and/or diagnosis of SARS-CoV-2 by FDA under an Emergency Use Authorization (EUA).  This EUA will remain in effect (meaning this te st can be used) for the duration of  the COVID-19 declaration under Section 564(b)(1) of the Act, 21 U.S.C. section 360-bbb-3(b)(1), unless the authorization is terminated or revoked sooner.  Performed at [facility], [address] [place], [place] [address]   Blood Culture (routine x 2)     Status: None (Preliminary result)   Collection Time: 02/26/20  4:21 PM   Specimen: BLOOD  Result Value Ref Range Status   Specimen Description   Final    BLOOD RIGHT ANTECUBITAL Performed at [facility], [address] [place], [place] [address]    Special Requests   Final    BOTTLES DRAWN AEROBIC AND ANAEROBIC Blood Culture adequate volume Performed at [facility], [address] [place], [place] [address]    Culture   Final    NO GROWTH 4 DAYS Performed at [facility], [address] [place], [place] [address]    Report Status PENDING  Incomplete     Time coordinating discharge: 45 minutes  SIGNED:   Coralie Keens, MD  [facility] 03/02/2020, 10:25 AM

## 2021-01-23 ENCOUNTER — Emergency Department (HOSPITAL_BASED_OUTPATIENT_CLINIC_OR_DEPARTMENT_OTHER): Payer: Medicare Other

## 2021-01-23 ENCOUNTER — Encounter (HOSPITAL_BASED_OUTPATIENT_CLINIC_OR_DEPARTMENT_OTHER): Payer: Self-pay

## 2021-01-23 ENCOUNTER — Other Ambulatory Visit: Payer: Self-pay

## 2021-01-23 ENCOUNTER — Observation Stay (HOSPITAL_BASED_OUTPATIENT_CLINIC_OR_DEPARTMENT_OTHER)
Admission: EM | Admit: 2021-01-23 | Discharge: 2021-01-25 | Disposition: A | Discharge status: Home / Self Care | Payer: Medicare Other | Attending: Family Medicine | Admitting: Family Medicine

## 2021-01-23 DIAGNOSIS — E039 Hypothyroidism, unspecified: Secondary | ICD-10-CM | POA: Diagnosis not present

## 2021-01-23 DIAGNOSIS — I169 Hypertensive crisis, unspecified: Secondary | ICD-10-CM | POA: Diagnosis not present

## 2021-01-23 DIAGNOSIS — R531 Weakness: Secondary | ICD-10-CM

## 2021-01-23 DIAGNOSIS — N1832 Chronic kidney disease, stage 3b: Secondary | ICD-10-CM | POA: Insufficient documentation

## 2021-01-23 DIAGNOSIS — I160 Hypertensive urgency: Secondary | ICD-10-CM | POA: Diagnosis not present

## 2021-01-23 DIAGNOSIS — Z79899 Other long term (current) drug therapy: Secondary | ICD-10-CM | POA: Diagnosis not present

## 2021-01-23 DIAGNOSIS — I129 Hypertensive chronic kidney disease with stage 1 through stage 4 chronic kidney disease, or unspecified chronic kidney disease: Secondary | ICD-10-CM | POA: Diagnosis not present

## 2021-01-23 DIAGNOSIS — Z20822 Contact with and (suspected) exposure to covid-19: Secondary | ICD-10-CM | POA: Insufficient documentation

## 2021-01-23 DIAGNOSIS — N183 Chronic kidney disease, stage 3 unspecified: Secondary | ICD-10-CM | POA: Diagnosis present

## 2021-01-23 DIAGNOSIS — Z7901 Long term (current) use of anticoagulants: Secondary | ICD-10-CM | POA: Insufficient documentation

## 2021-01-23 DIAGNOSIS — Z8616 Personal history of COVID-19: Secondary | ICD-10-CM | POA: Diagnosis not present

## 2021-01-23 DIAGNOSIS — R5383 Other fatigue: Secondary | ICD-10-CM

## 2021-01-23 DIAGNOSIS — R42 Dizziness and giddiness: Secondary | ICD-10-CM | POA: Diagnosis present

## 2021-01-23 LAB — COMPREHENSIVE METABOLIC PANEL
ALT: 11 U/L (ref 0–44)
AST: 17 U/L (ref 15–41)
Albumin: 4 g/dL (ref 3.5–5.0)
Alkaline Phosphatase: 56 U/L (ref 38–126)
Anion gap: 9 (ref 5–15)
BUN: 33 mg/dL — ABNORMAL HIGH (ref 8–23)
CO2: 27 mmol/L (ref 22–32)
Calcium: 9 mg/dL (ref 8.9–10.3)
Chloride: 103 mmol/L (ref 98–111)
Creatinine, Ser: 1.77 mg/dL — ABNORMAL HIGH (ref 0.44–1.00)
GFR, Estimated: 26 mL/min — ABNORMAL LOW (ref >60)
Glucose, Bld: 106 mg/dL — ABNORMAL HIGH (ref 70–99)
Potassium: 4.5 mmol/L (ref 3.5–5.1)
Sodium: 139 mmol/L (ref 135–145)
Total Bilirubin: 0.5 mg/dL (ref 0.3–1.2)
Total Protein: 7.4 g/dL (ref 6.5–8.1)

## 2021-01-23 LAB — URINALYSIS, MICROSCOPIC (REFLEX)

## 2021-01-23 LAB — CBC WITH DIFFERENTIAL/PLATELET
Abs Immature Granulocytes: 0.09 10*3/uL — ABNORMAL HIGH (ref 0.00–0.07)
Basophils Absolute: 0.1 10*3/uL (ref 0.0–0.1)
Basophils Relative: 1 %
Eosinophils Absolute: 0.1 10*3/uL (ref 0.0–0.5)
Eosinophils Relative: 1 %
HCT: 41 % (ref 36.0–46.0)
Hemoglobin: 13.4 g/dL (ref 12.0–15.0)
Immature Granulocytes: 1 %
Lymphocytes Relative: 13 %
Lymphs Abs: 1.2 10*3/uL (ref 0.7–4.0)
MCH: 29.7 pg (ref 26.0–34.0)
MCHC: 32.7 g/dL (ref 30.0–36.0)
MCV: 90.9 fL (ref 80.0–100.0)
Monocytes Absolute: 0.6 10*3/uL (ref 0.1–1.0)
Monocytes Relative: 6 %
Neutro Abs: 7.6 10*3/uL (ref 1.7–7.7)
Neutrophils Relative %: 78 %
Platelets: 266 10*3/uL (ref 150–400)
RBC: 4.51 MIL/uL (ref 3.87–5.11)
RDW: 13.1 % (ref 11.5–15.5)
WBC: 9.7 10*3/uL (ref 4.0–10.5)
nRBC: 0 % (ref 0.0–0.2)

## 2021-01-23 LAB — C-REACTIVE PROTEIN: CRP: 0.5 mg/dL (ref <1.0)

## 2021-01-23 LAB — URINALYSIS, ROUTINE W REFLEX MICROSCOPIC
Bilirubin Urine: NEGATIVE
Glucose, UA: NEGATIVE mg/dL
Hgb urine dipstick: NEGATIVE
Ketones, ur: NEGATIVE mg/dL
Leukocytes,Ua: NEGATIVE
Nitrite: NEGATIVE
Protein, ur: 100 mg/dL — AB
Specific Gravity, Urine: 1.015 (ref 1.005–1.030)
pH: 7 (ref 5.0–8.0)

## 2021-01-23 LAB — TROPONIN I (HIGH SENSITIVITY)
Troponin I (High Sensitivity): 11 ng/L (ref <18)
Troponin I (High Sensitivity): 14 ng/L (ref <18)

## 2021-01-23 LAB — RESP PANEL BY RT-PCR (FLU A&B, COVID) ARPGX2
Influenza A by PCR: NEGATIVE
Influenza B by PCR: NEGATIVE
SARS Coronavirus 2 by RT PCR: NEGATIVE

## 2021-01-23 LAB — SEDIMENTATION RATE: Sed Rate: 22 mm/hr (ref 0–22)

## 2021-01-23 MED ORDER — ONDANSETRON HCL 4 MG PO TABS: 4.0000 mg | ORAL_TABLET | Freq: Four times a day (QID) | ORAL | Status: DC | PRN

## 2021-01-23 MED ORDER — LEVOTHYROXINE SODIUM 100 MCG PO TABS
100.0000 ug | ORAL_TABLET | Freq: Every day | ORAL | Status: DC
  Administered 2021-01-24 – 2021-01-25 (×2): 100 ug via ORAL
  Filled 2021-01-23 (×2): qty 1

## 2021-01-23 MED ORDER — LEVOTHYROXINE SODIUM 25 MCG PO TABS: 25.0000 ug | ORAL_TABLET | Freq: Every day | ORAL | Status: DC

## 2021-01-23 MED ORDER — ACETAMINOPHEN 325 MG PO TABS
650.0000 mg | ORAL_TABLET | Freq: Once | ORAL | Status: AC
  Administered 2021-01-23: 650 mg via ORAL
  Filled 2021-01-23: qty 2

## 2021-01-23 MED ORDER — AMLODIPINE BESYLATE 5 MG PO TABS
5.0000 mg | ORAL_TABLET | Freq: Every day | ORAL | Status: DC
  Administered 2021-01-23: 5 mg via ORAL

## 2021-01-23 MED ORDER — APIXABAN 2.5 MG PO TABS
2.5000 mg | ORAL_TABLET | Freq: Two times a day (BID) | ORAL | Status: DC
  Administered 2021-01-23 – 2021-01-25 (×4): 2.5 mg via ORAL
  Filled 2021-01-23 (×4): qty 1

## 2021-01-23 MED ORDER — AMLODIPINE BESYLATE 10 MG PO TABS
10.0000 mg | ORAL_TABLET | Freq: Every day | ORAL | Status: DC
  Filled 2021-01-23: qty 1

## 2021-01-23 MED ORDER — HYDRALAZINE HCL 20 MG/ML IJ SOLN
10.0000 mg | Freq: Once | INTRAMUSCULAR | Status: AC
  Administered 2021-01-23: 10 mg via INTRAVENOUS
  Filled 2021-01-23: qty 1

## 2021-01-23 MED ORDER — ACETAMINOPHEN 650 MG RE SUPP: 650.0000 mg | Freq: Four times a day (QID) | RECTAL | Status: DC | PRN

## 2021-01-23 MED ORDER — SODIUM CHLORIDE 0.9 % IV BOLUS
500.0000 mL | Freq: Once | INTRAVENOUS | Status: AC
  Administered 2021-01-23: 500 mL via INTRAVENOUS

## 2021-01-23 MED ORDER — ACETAMINOPHEN 325 MG PO TABS: 650.0000 mg | ORAL_TABLET | Freq: Four times a day (QID) | ORAL | Status: DC | PRN

## 2021-01-23 MED ORDER — ONDANSETRON HCL 4 MG/2ML IJ SOLN: 4.0000 mg | Freq: Four times a day (QID) | INTRAMUSCULAR | Status: DC | PRN

## 2021-01-23 MED ORDER — METOPROLOL TARTRATE 5 MG/5ML IV SOLN
5.0000 mg | Freq: Four times a day (QID) | INTRAVENOUS | Status: DC | PRN
  Administered 2021-01-23 – 2021-01-25 (×3): 5 mg via INTRAVENOUS
  Filled 2021-01-23 (×3): qty 5

## 2021-01-23 NOTE — ED Notes (Signed)
Patient transported to X-ray 

## 2021-01-23 NOTE — ED Triage Notes (Signed)
Pt c/o HA, dizziness x 1 week-denies injury-to triage in w/c-NAD

## 2021-01-23 NOTE — Progress Notes (Signed)
Patient arrived to room 1511 from [facility] via [place]. Patient is alert and oriented, no signs or symptoms of distress. [facility] paged to assign MD. MD assigned as Dr. Alvino Chapel. Awaiting new orders for new admission. Will continue to monitor.   01/23/21 1814  Assess: MEWS Score  Temp 98.5 F (36.9 C)  BP (!) 205/75  Pulse Rate 77  Resp 20  SpO2 100 %  O2 Device Room Air  Assess: MEWS Score  MEWS Temp 0  MEWS Systolic 2  MEWS Pulse 0  MEWS RR 0  MEWS LOC 0  MEWS Score 2  MEWS Score Color Yellow  Assess: if the MEWS score is Yellow or Red  Were vital signs taken at a resting state? Yes  Focused Assessment No change from prior assessment  Does the patient meet 2 or more of the SIRS criteria? No  MEWS guidelines implemented *See Row Information* Yes  Treat  MEWS Interventions Other (Comment) (assessed / waiting for attending and orders)  Pain Scale 0-10  Pain Score 0  Take Vital Signs  Increase Vital Sign Frequency  Yellow: Q 2hr X 2 then Q 4hr X 2, if remains yellow, continue Q 4hrs  Escalate  MEWS: Escalate Yellow: discuss with charge nurse/RN and consider discussing with provider and RRT  Notify: Charge Nurse/RN  Name of Charge Nurse/RN Notified Christena Deem, RN  Date Charge Nurse/RN Notified 01/23/21  Time Charge Nurse/RN Notified 1814  Document  Patient Outcome Other (Comment) (remains on unit)  Progress note created (see row info) Yes  Assess: SIRS CRITERIA  SIRS Temperature  0  SIRS Pulse 0  SIRS Respirations  0  SIRS WBC 0  SIRS Score Sum  0

## 2021-01-23 NOTE — H&P (Signed)
History and Physical   COLLYN SELK WUJ:[id number] DOB: 1920-11-13 DOA: 01/23/2021  Referring MD/NP/PA: Dr. Lawrence Marseilles  PCP: Doreen Salvage, PA-C   Outpatient Specialists: None  Patient coming from: [facility]  Chief Complaint: Headache  HPI: Stacy Huang is a 87 y.o. female with medical history significant of essential hypertension, hypothyroidism, history of shoulder fracture, chronic kidney disease stage IIIb, and previous COVID-19 infection who presented to med [facility] with dizziness and headache.  Headache was left-sided which has been going on for about 3 days.  Patient also felt dizzy at some point.  She felt like she was going to fall over.  Patient normally will have walk with a cane or walker.  She now started having difficulty with mobility.  Denied any confusion.  She has taken some Tylenol.  No chest pain.  Patient was found to have markedly elevated blood pressure.  Given initial IV hydralazine.  She is being admitted with hypertensive urgency.  At this point blood pressure has responded and is slowly getting better..  ED Course: Temperature 98.5 initial blood pressure 249/73, pulse 110 respiratory 20 oxygen sat 98% on room air.  CBC entirely within normal chemistry showed BUN 33 creatinine 1.77 calcium 9.0.  Troponin 11 and 14.  CRP was 0.5.  Influenza and COVID-19 negative.  Head CT without contrast showed no acute findings.  Chest x-ray shows no significant findings.  Patient being admitted with hypertensive urgency for further evaluation.  Review of Systems: As per HPI otherwise 10 point review of systems negative.    Past Medical History:  Diagnosis Date   Hypertension    Shoulder fracture, right    Thyroid disease     History reviewed. No pertinent surgical history.   reports that she has never smoked. She has never used smokeless tobacco. She reports that she does not drink alcohol and does not use drugs.  No Known Allergies  No family  history on file.   Prior to Admission medications   Medication Sig Start Date End Date Taking? Authorizing Provider  acetaminophen (TYLENOL) 325 MG tablet Take 2 tablets (650 mg total) by mouth every 6 (six) hours as needed for mild pain or headache (fever >/= 101). 03/02/20   Arrien, York Ram, MD  amLODipine (NORVASC) 10 MG tablet Take 10 mg by mouth daily. 12/25/19   [provider]  apixaban (ELIQUIS) 5 MG TABS tablet Take 2 tablets twice daily until 03/05/20, then continue with 1 tablet twice daily starting 03/06/20. 03/02/20   Arrien, York Ram, MD  guaiFENesin-dextromethorphan (ROBITUSSIN DM) 100-10 MG/5ML syrup Take 5 mLs by mouth every 6 (six) hours as needed for cough. 03/02/20   Arrien, York Ram, MD  levothyroxine (SYNTHROID) 25 MCG tablet Take 1 tablet (25 mcg total) by mouth daily at 6 (six) AM. 03/03/20 04/02/20  Arrien, York Ram, MD  losartan (COZAAR) 25 MG tablet Take 25 mg by mouth daily. 11/11/19   [provider]  meclizine (ANTIVERT) 25 MG tablet Take 25 mg by mouth daily as needed for dizziness.  01/12/20   [provider]    Physical Exam: Vitals:   01/23/21 1600 01/23/21 1630 01/23/21 1700 01/23/21 1814  BP: (!) 196/172 (!) 172/76 (!) 191/63 (!) 205/75  Pulse: 81 75 80 77  Resp: 18 14 12 20   Temp:    98.5 F (36.9 C)  TempSrc:    Oral  SpO2: 100% 100% 100% 100%  Height:  Constitutional: Frail, chronically ill looking no distress Vitals:   01/23/21 1600 01/23/21 1630 01/23/21 1700 01/23/21 1814  BP: (!) 196/172 (!) 172/76 (!) 191/63 (!) 205/75  Pulse: 81 75 80 77  Resp: 18 14 12 20   Temp:    98.5 F (36.9 C)  TempSrc:    Oral  SpO2: 100% 100% 100% 100%  Height:       Eyes: PERRL, lids and conjunctivae normal ENMT: Mucous membranes are moist. Posterior pharynx clear of any exudate or lesions.Normal dentition.  Neck: normal, supple, no masses, no thyromegaly Respiratory: clear to auscultation  bilaterally, no wheezing, no crackles. Normal respiratory effort. No accessory muscle use.  Cardiovascular: Regular rate and rhythm, no murmurs / rubs / gallops. No extremity edema. 2+ pedal pulses. No carotid bruits.  Abdomen: no tenderness, no masses palpated. No hepatosplenomegaly. Bowel sounds positive.  Musculoskeletal: no clubbing / cyanosis. No joint deformity upper and lower extremities. Good ROM, no contractures. Normal muscle tone.  Skin: no rashes, lesions, ulcers. No induration Neurologic: CN 2-12 grossly intact. Sensation intact, DTR normal. Strength 5/5 in all 4.  Psychiatric: Awake and alert oriented x3, mild disorientation   Labs on Admission: I have personally reviewed following labs and imaging studies  CBC: Recent Labs  Lab 01/23/21 1359  WBC 9.7  NEUTROABS 7.6  HGB 13.4  HCT 41.0  MCV 90.9  PLT 266   Basic Metabolic Panel: Recent Labs  Lab 01/23/21 1359  NA 139  K 4.5  CL 103  CO2 27  GLUCOSE 106*  BUN 33*  CREATININE 1.77*  CALCIUM 9.0   GFR: CrCl cannot be calculated (Unknown ideal weight.). Liver Function Tests: Recent Labs  Lab 01/23/21 1359  AST 17  ALT 11  ALKPHOS 56  BILITOT 0.5  PROT 7.4  ALBUMIN 4.0   No results for input(s): LIPASE, AMYLASE in the last 168 hours. No results for input(s): AMMONIA in the last 168 hours. Coagulation Profile: No results for input(s): INR, PROTIME in the last 168 hours. Cardiac Enzymes: No results for input(s): CKTOTAL, CKMB, CKMBINDEX, TROPONINI in the last 168 hours. BNP (last 3 results) No results for input(s): PROBNP in the last 8760 hours. HbA1C: No results for input(s): HGBA1C in the last 72 hours. CBG: No results for input(s): GLUCAP in the last 168 hours. Lipid Profile: No results for input(s): CHOL, HDL, LDLCALC, TRIG, CHOLHDL, LDLDIRECT in the last 72 hours. Thyroid Function Tests: No results for input(s): TSH, T4TOTAL, FREET4, T3FREE, THYROIDAB in the last 72 hours. Anemia Panel: No  results for input(s): VITAMINB12, FOLATE, FERRITIN, TIBC, IRON, RETICCTPCT in the last 72 hours. Urine analysis:    Component Value Date/Time   COLORURINE YELLOW 01/23/2021 1309   APPEARANCEUR CLEAR 01/23/2021 1309   LABSPEC 1.015 01/23/2021 1309   PHURINE 7.0 01/23/2021 1309   GLUCOSEU NEGATIVE 01/23/2021 1309   HGBUR NEGATIVE 01/23/2021 1309   BILIRUBINUR NEGATIVE 01/23/2021 1309   KETONESUR NEGATIVE 01/23/2021 1309   PROTEINUR 100 (A) 01/23/2021 1309   NITRITE NEGATIVE 01/23/2021 1309   LEUKOCYTESUR NEGATIVE 01/23/2021 1309   Sepsis Labs: @LABRCNTIP (procalcitonin:4,lacticidven:4) ) Recent Results (from the past 240 hour(s))  Resp Panel by RT-PCR (Flu A&B, Covid) Nasopharyngeal Swab     Status: None   Collection Time: 01/23/21  1:59 PM   Specimen: Nasopharyngeal Swab; Nasopharyngeal(NP) swabs in vial transport medium  Result Value Ref Range Status   SARS Coronavirus 2 by RT PCR NEGATIVE NEGATIVE Final    Comment: (NOTE) SARS-CoV-2 target nucleic acids are NOT  DETECTED.  The SARS-CoV-2 RNA is generally detectable in upper respiratory specimens during the acute phase of infection. The lowest concentration of SARS-CoV-2 viral copies this assay can detect is 138 copies/mL. A negative result does not preclude SARS-Cov-2 infection and should not be used as the sole basis for treatment or other patient management decisions. A negative result may occur with  improper specimen collection/handling, submission of specimen other than nasopharyngeal swab, presence of viral mutation(s) within the areas targeted by this assay, and inadequate number of viral copies(<138 copies/mL). A negative result must be combined with clinical observations, patient history, and epidemiological information. The expected result is Negative.  Fact Sheet for Patients:  BloggerCourse.comhttps://www.fda.gov/media/152166/download  Fact Sheet for Healthcare Providers:  SeriousBroker.ithttps://www.fda.gov/media/152162/download  This test  is no t yet approved or cleared by the [place] FDA and  has been authorized for detection and/or diagnosis of SARS-CoV-2 by FDA under an Emergency Use Authorization (EUA). This EUA will remain  in effect (meaning this test can be used) for the duration of the COVID-19 declaration under Section 564(b)(1) of the Act, 21 U.S.C.section 360bbb-3(b)(1), unless the authorization is terminated  or revoked sooner.       Influenza A by PCR NEGATIVE NEGATIVE Final   Influenza B by PCR NEGATIVE NEGATIVE Final    Comment: (NOTE) The Xpert Xpress SARS-CoV-2/FLU/RSV plus assay is intended as an aid in the diagnosis of influenza from Nasopharyngeal swab specimens and should not be used as a sole basis for treatment. Nasal washings and aspirates are unacceptable for Xpert Xpress SARS-CoV-2/FLU/RSV testing.  Fact Sheet for Patients: BloggerCourse.comhttps://www.fda.gov/media/152166/download  Fact Sheet for Healthcare Providers: SeriousBroker.ithttps://www.fda.gov/media/152162/download  This test is not yet approved or cleared by the [place] FDA and has been authorized for detection and/or diagnosis of SARS-CoV-2 by FDA under an Emergency Use Authorization (EUA). This EUA will remain in effect (meaning this test can be used) for the duration of the COVID-19 declaration under Section 564(b)(1) of the Act, 21 U.S.C. section 360bbb-3(b)(1), unless the authorization is terminated or revoked.  Performed at [facility], [address] [place], [place] [id number]      Radiological Exams on Admission: CT HEAD WO CONTRAST (5MM)  Result Date: 01/23/2021 CLINICAL DATA:  Neuro deficit, headache, dizziness since Friday EXAM: CT HEAD WITHOUT CONTRAST TECHNIQUE: Contiguous axial images were obtained from the base of the skull through the vertex without intravenous contrast. COMPARISON:  None. FINDINGS: Brain: There is no acute intracranial hemorrhage, extra-axial fluid collection, or infarct. There is moderate  parenchymal volume loss with ex vacuo dilatation of the ventricular system. There is right worse than left hippocampal atrophy foci of hypodensity in the subcortical and periventricular white matter nonspecific but likely reflect sequela of chronic white matter microangiopathy. There is a remote lacunar infarct in the left basal ganglia. There is no mass lesion. There is no midline shift. Vascular: There is calcification of the bilateral cavernous ICAs. Skull: Normal. Negative for fracture or focal lesion. Sinuses/Orbits: The imaged paranasal sinuses are clear. The imaged globes and orbits are unremarkable. Other: None. IMPRESSION: No acute intracranial pathology. Electronically Signed   By: Lesia HausenPeter  Noone M.D.   On: 01/23/2021 14:45   DG Chest Portable 1 View  Result Date: 01/23/2021 CLINICAL DATA:  Dizziness.  Headache. EXAM: PORTABLE CHEST 1 VIEW COMPARISON:  Chest x-ray 02/26/2020, CT chest 02/19/2008 FINDINGS: The heart size and mediastinal contours are unchanged. Aortic calcification. Question hazy interstitial opacities in the peripheral left lower lung. Otherwise no focal consolidation.  No pulmonary edema. No pleural effusion. No pneumothorax. No acute osseous abnormality. IMPRESSION: Question hazy interstitial opacities in the peripheral left lower lung. Electronically Signed   By: Tish Frederickson M.D.   On: 01/23/2021 15:12    EKG: Independently reviewed.  It shows sinus rhythm with QTc interval of 501 and nonspecific ST changes  Assessment/Plan Principal Problem:   Hypertensive urgency Active Problems:   Generalized weakness   CKD (chronic kidney disease) stage 3, GFR 30-59 ml/min (HCC)     #1 hypertensive urgency: Patient is on amlodipine and losartan at home.  We will initiate that.  Additionally metoprolol as needed and hydralazine IV will be added.  Patient's dose of amlodipine may need to be changed to 10 mg as we monitor blood pressure.  Systolics is already down to 170.  We will  attempt to bring it down slowly.  #2 hypothyroidism: Continue with levothyroxine  #3 hyperlipidemia: On Pravachol 20 mg.  We will continue  #4 chronic kidney disease stage IIIb: Continue monitoring.  #5 chronic anticoagulation: Reported history of DVT.  Continue apixaban  #6 generalized debility: PT OT  DVT prophylaxis: Apixaban Code Status: DNR Family Communication: No family at bedside Disposition Plan: To be determined Consults called: None Admission status: Inpatient  Severity of Illness: The appropriate patient status for this patient is INPATIENT. Inpatient status is judged to be reasonable and necessary in order to provide the required intensity of service to ensure the patient's safety. The patient's presenting symptoms, physical exam findings, and initial radiographic and laboratory data in the context of their chronic comorbidities is felt to place them at high risk for further clinical deterioration. Furthermore, it is not anticipated that the patient will be medically stable for discharge from the hospital within 2 midnights of admission. The following factors support the patient status of inpatient.   " The patient's presenting symptoms include dizziness and headache. " The worrisome physical exam findings include frail with no focal findings. " The initial radiographic and laboratory data are worrisome because of no significant findings. " The chronic co-morbidities include hypertension.   * I certify that at the point of admission it is my clinical judgment that the patient will require inpatient hospital care spanning beyond 2 midnights from the point of admission due to high intensity of service, high risk for further deterioration and high frequency of surveillance required.Lonia Blood MD [facility] Pager [phone]  If 7PM-7AM, please contact night-coverage [email or web address] Password [facility]  01/23/2021, 6:40 PM

## 2021-01-23 NOTE — ED Notes (Signed)
[facility] here to get pt 

## 2021-01-23 NOTE — ED Notes (Signed)
ED Provider at bedside. 

## 2021-01-23 NOTE — ED Provider Notes (Signed)
[facility] EMERGENCY DEPARTMENT Provider Note   CSN: [id number] Arrival date & time: 01/23/21  1134     History Chief Complaint  Patient presents with   Headache    Stacy Huang is a 87 y.o. female.  HPI 87 year old female presents with dizziness and headache.  Has been having a left-sided headache for about 3 days.  Has also been concomitantly dizzy.  Feels like she is going to fall over.  Normally walks with a cane or a walker and seems to have a little more difficulty.  Family states she is not confused and has not had a fever.  She has been taking Tylenol with no significant relief.  No vision changes or chest pain but maybe a little bit of shortness of breath and cough.  Has chronic right-sided weakness that she states is unchanged from baseline.  Past Medical History:  Diagnosis Date   Hypertension    Shoulder fracture, right    Thyroid disease     Patient Active Problem List   Diagnosis Date Noted   CKD (chronic kidney disease) stage 3, GFR 30-59 ml/min (HCC) 03/02/2020   Acute hypoxemic respiratory failure due to COVID-19 (HCC) 02/27/2020   AKI (acute kidney injury) (HCC) 02/27/2020   Positive D dimer 02/27/2020   Hypertensive urgency 02/27/2020   Generalized weakness 02/27/2020   Pneumonia due to COVID-19 virus 02/26/2020    History reviewed. No pertinent surgical history.   OB History   No obstetric history on file.     No family history on file.  Social History   Tobacco Use   Smoking status: Never   Smokeless tobacco: Never  Substance Use Topics   Alcohol use: No   Drug use: No    Home Medications Prior to Admission medications   Medication Sig Start Date End Date Taking? Authorizing Provider  acetaminophen (TYLENOL) 325 MG tablet Take 2 tablets (650 mg total) by mouth every 6 (six) hours as needed for mild pain or headache (fever >/= 101). 03/02/20   Arrien, York Ram, MD  amLODipine (NORVASC) 10 MG tablet Take 10 mg by mouth  daily. 12/25/19   [provider]  apixaban (ELIQUIS) 5 MG TABS tablet Take 2 tablets twice daily until 03/05/20, then continue with 1 tablet twice daily starting 03/06/20. 03/02/20   Arrien, York Ram, MD  guaiFENesin-dextromethorphan (ROBITUSSIN DM) 100-10 MG/5ML syrup Take 5 mLs by mouth every 6 (six) hours as needed for cough. 03/02/20   Arrien, York Ram, MD  levothyroxine (SYNTHROID) 25 MCG tablet Take 1 tablet (25 mcg total) by mouth daily at 6 (six) AM. 03/03/20 04/02/20  Arrien, York Ram, MD  losartan (COZAAR) 25 MG tablet Take 25 mg by mouth daily. 11/11/19   [provider]  meclizine (ANTIVERT) 25 MG tablet Take 25 mg by mouth daily as needed for dizziness.  01/12/20   [provider]    Allergies    Patient has no known allergies.  Review of Systems   Review of Systems  Constitutional:  Negative for fever.  Eyes:  Negative for visual disturbance.  Respiratory:  Positive for cough and shortness of breath.   Cardiovascular:  Negative for chest pain.  Gastrointestinal:  Negative for vomiting.  Neurological:  Positive for dizziness and headaches. Negative for weakness and numbness.  All other systems reviewed and are negative.  Physical Exam Updated Vital Signs BP (!) 249/73   Pulse (!) 110   Temp 98.4 F (36.9 C) (Oral)   Resp  19   Ht 5\' 2"  (1.575 m)   SpO2 100%   BMI 25.56 kg/m   Physical Exam Vitals and nursing note reviewed.  Constitutional:      Appearance: She is well-developed.  HENT:     Head: Normocephalic and atraumatic.     Comments: Tenderness over left temple    Right Ear: External ear normal.     Left Ear: External ear normal.     Nose: Nose normal.  Eyes:     General:        Right eye: No discharge.        Left eye: No discharge.     Extraocular Movements: Extraocular movements intact.     Pupils: Pupils are equal, round, and reactive to light.  Cardiovascular:     Rate and Rhythm: Normal rate and regular  rhythm.     Heart sounds: Normal heart sounds.  Pulmonary:     Effort: Pulmonary effort is normal.     Breath sounds: Normal breath sounds.  Abdominal:     Palpations: Abdomen is soft.     Tenderness: There is no abdominal tenderness.  Musculoskeletal:     Cervical back: Neck supple.  Skin:    General: Skin is warm and dry.  Neurological:     Mental Status: She is alert.     Comments: CN 3-12 grossly intact. 5/5 strength in all 4 extremities, though a little weaker in the RLE. However patient states this is normal for her due to arthritis. Grossly normal sensation. Normal finger to nose. She is able to walk slowly with assistance. shuffles  Psychiatric:        Mood and Affect: Mood is not anxious.    ED Results / Procedures / Treatments   Labs (all labs ordered are listed, but only abnormal results are displayed) Labs Reviewed  CBC WITH DIFFERENTIAL/PLATELET - Abnormal; Notable for the following components:      Result Value   Abs Immature Granulocytes 0.09 (*)    All other components within normal limits  COMPREHENSIVE METABOLIC PANEL - Abnormal; Notable for the following components:   Glucose, Bld 106 (*)    BUN 33 (*)    Creatinine, Ser 1.77 (*)    GFR, Estimated 26 (*)    All other components within normal limits  URINALYSIS, ROUTINE W REFLEX MICROSCOPIC - Abnormal; Notable for the following components:   Protein, ur 100 (*)    All other components within normal limits  URINALYSIS, MICROSCOPIC (REFLEX) - Abnormal; Notable for the following components:   Bacteria, UA MANY (*)    All other components within normal limits  RESP PANEL BY RT-PCR (FLU A&B, COVID) [id number]  SEDIMENTATION RATE  C-REACTIVE PROTEIN  CBG MONITORING, ED  TROPONIN I (HIGH SENSITIVITY)    EKG EKG Interpretation  Date/Time:  Monday January 23 2021 13:34:54 EDT Ventricular Rate:  64 PR Interval:  158 QRS Duration: 80 QT Interval:  485 QTC Calculation: 501 R Axis:   35 Text  Interpretation: Sinus rhythm Minimal ST elevation, inferior leads Prolonged QT interval Confirmed by 12-18-1999 [phone]) on 01/23/2021 1:55:35 PM  Radiology CT HEAD WO CONTRAST (01/25/2021)  Result Date: 01/23/2021 CLINICAL DATA:  Neuro deficit, headache, dizziness since Friday EXAM: CT HEAD WITHOUT CONTRAST TECHNIQUE: Contiguous axial images were obtained from the base of the skull through the vertex without intravenous contrast. COMPARISON:  None. FINDINGS: Brain: There is no acute intracranial hemorrhage, extra-axial fluid collection, or infarct. There is moderate parenchymal volume loss  with ex vacuo dilatation of the ventricular system. There is right worse than left hippocampal atrophy foci of hypodensity in the subcortical and periventricular white matter nonspecific but likely reflect sequela of chronic white matter microangiopathy. There is a remote lacunar infarct in the left basal ganglia. There is no mass lesion. There is no midline shift. Vascular: There is calcification of the bilateral cavernous ICAs. Skull: Normal. Negative for fracture or focal lesion. Sinuses/Orbits: The imaged paranasal sinuses are clear. The imaged globes and orbits are unremarkable. Other: None. IMPRESSION: No acute intracranial pathology. Electronically Signed   By: Lesia Hausen M.D.   On: 01/23/2021 14:45    Procedures Procedures   Medications Ordered in ED Medications  sodium chloride 0.9 % bolus 500 mL (500 mLs Intravenous New Bag/Given 01/23/21 1424)  acetaminophen (TYLENOL) tablet 650 mg (650 mg Oral Given 01/23/21 1424)  hydrALAZINE (APRESOLINE) injection 10 mg (10 mg Intravenous Given 01/23/21 1457)    ED Course  I have reviewed the triage vital signs and the nursing notes.  Pertinent labs & imaging results that were available during my care of the patient were reviewed by me and considered in my medical decision making (see chart for details).    MDM Rules/Calculators/A&P                            Patient is in no distress. Her RLE is a little weaker than left, but she states this is chronic. No emergent findings on initial workup, including benign head CT. However, her BP is quite elevated, and at a max was 249/73. Given this, I think it is most prudent to give IV medications (will start with hydralazine given HR low 60s) and try to get MAP down 25%. Will need admission. I suspect the headache and dizziness is related to this increase in BP. No end organ damage at this time. Discussed with Dr. Alvino Chapel. Final Clinical Impression(s) / ED Diagnoses Final diagnoses:  Hypertensive crisis    Rx / DC Orders ED Discharge Orders     None        Pricilla Loveless, MD 01/23/21 1510

## 2021-01-24 DIAGNOSIS — I160 Hypertensive urgency: Secondary | ICD-10-CM | POA: Diagnosis not present

## 2021-01-24 LAB — COMPREHENSIVE METABOLIC PANEL
ALT: 11 U/L (ref 0–44)
AST: 15 U/L (ref 15–41)
Albumin: 3.4 g/dL — ABNORMAL LOW (ref 3.5–5.0)
Alkaline Phosphatase: 43 U/L (ref 38–126)
Anion gap: 11 (ref 5–15)
BUN: 27 mg/dL — ABNORMAL HIGH (ref 8–23)
CO2: 22 mmol/L (ref 22–32)
Calcium: 9.2 mg/dL (ref 8.9–10.3)
Chloride: 110 mmol/L (ref 98–111)
Creatinine, Ser: 1.4 mg/dL — ABNORMAL HIGH (ref 0.44–1.00)
GFR, Estimated: 34 mL/min — ABNORMAL LOW (ref >60)
Glucose, Bld: 103 mg/dL — ABNORMAL HIGH (ref 70–99)
Potassium: 3.8 mmol/L (ref 3.5–5.1)
Sodium: 143 mmol/L (ref 135–145)
Total Bilirubin: 0.7 mg/dL (ref 0.3–1.2)
Total Protein: 6.1 g/dL — ABNORMAL LOW (ref 6.5–8.1)

## 2021-01-24 LAB — CBC
HCT: 39.6 % (ref 36.0–46.0)
Hemoglobin: 12.5 g/dL (ref 12.0–15.0)
MCH: 29.6 pg (ref 26.0–34.0)
MCHC: 31.6 g/dL (ref 30.0–36.0)
MCV: 93.8 fL (ref 80.0–100.0)
Platelets: 263 10*3/uL (ref 150–400)
RBC: 4.22 MIL/uL (ref 3.87–5.11)
RDW: 13.2 % (ref 11.5–15.5)
WBC: 8.5 10*3/uL (ref 4.0–10.5)
nRBC: 0 % (ref 0.0–0.2)

## 2021-01-24 MED ORDER — LOSARTAN POTASSIUM 50 MG PO TABS
25.0000 mg | ORAL_TABLET | Freq: Every day | ORAL | Status: DC
  Administered 2021-01-24: 25 mg via ORAL
  Filled 2021-01-24: qty 1

## 2021-01-24 MED ORDER — PRAVASTATIN SODIUM 20 MG PO TABS
20.0000 mg | ORAL_TABLET | Freq: Every day | ORAL | Status: DC
  Administered 2021-01-24 – 2021-01-25 (×2): 20 mg via ORAL
  Filled 2021-01-24 (×2): qty 1

## 2021-01-24 MED ORDER — AMLODIPINE BESYLATE 10 MG PO TABS
10.0000 mg | ORAL_TABLET | Freq: Every day | ORAL | Status: DC
  Administered 2021-01-24 – 2021-01-25 (×2): 10 mg via ORAL
  Filled 2021-01-24 (×2): qty 1

## 2021-01-24 MED ORDER — LEVOTHYROXINE SODIUM 100 MCG PO TABS: 100.0000 ug | ORAL_TABLET | Freq: Every day | ORAL | Status: DC

## 2021-01-24 MED ORDER — AMLODIPINE BESYLATE 5 MG PO TABS: 5.0000 mg | ORAL_TABLET | Freq: Every day | ORAL | Status: DC

## 2021-01-24 MED ORDER — ACETAMINOPHEN 325 MG PO TABS: 650.0000 mg | ORAL_TABLET | Freq: Four times a day (QID) | ORAL | Status: DC | PRN

## 2021-01-24 MED ORDER — APIXABAN 2.5 MG PO TABS: 2.5000 mg | ORAL_TABLET | Freq: Two times a day (BID) | ORAL | Status: DC

## 2021-01-24 NOTE — TOC Progression Note (Signed)
Transition of Care [facility]) - Progression Note    Patient Details  Name: Stacy Huang MRN: [id number] Date of Birth: 22-May-1921  [facility]) CM/SW Contact  Darleene Cleaver, [place] Phone Number: 01/24/2021, 4:52 PM  Clinical Narrative:    CSW was informed that patient needs a code 27 documentation.  CSW completed code 43 documentation for assigned CSW.                                            Social Determinants of Health (SDOH) Interventions    Readmission Risk Interventions No flowsheet data found.

## 2021-01-24 NOTE — Progress Notes (Signed)
PROGRESS NOTE    Stacy Huang  YYT:[id number] DOB: 1920/07/19 DOA: 01/23/2021 PCP: Doreen Salvage, PA-C   Chief Complaint  Patient presents with   Headache   Brief Narrative: 87 year old female with history of CKD, hypertension COVID infection in September 2021 history of shoulder fracture seen at the [facility] for dizziness, headache mostly left-sided onset 3 days prior to admission and at some point felt dizzy, she felt she was going to fall allover, also had difficulty with mobility, no confusion.  She was seen in the ED blood pressure was 249/7310 work-up showed creatinine 1.7, high-sensitivity troponin 11, 14 , Chest x-ray no acute finding CT head no acute finding.  Patient was admitted for hypertensive urgency symptomatic hypertension  Subjective: Seen and examined this morning.  On the bedside chair.  This morning has no dizziness no headache denies focal weakness.  Assessment & Plan:  Hypertensive urgency in the setting of essential hypertension: Symptomatic with dizziness and headache.Nonfocal on exam.CT head no acute finding. BP poorly controlled. We will increase to amlodipine from 5 to 10 mg, continue home losartan 25 mg and may need to uptitrate losartan.  Monitor blood pressure  Generalized weakness: 2/2 htn urgency. Ptot eval.   CKD (chronic kidney disease) stage 3B, Creat better this am. Recent Labs  Lab 01/23/21 1359 01/24/21 0423  BUN 33* 27*  CREATININE 1.77* 1.40*    Chronic anticoagulation w/ DVT hx- cont home antivoagulation.  HLD-on pravastatin.  Hypothyroidism-cont synthroid  Diet Order             Diet Heart Room service appropriate? Yes; Fluid consistency: Thin  Diet effective now                  Patient's Body mass index is 25.56 kg/m.  DVT prophylaxis: apixaban (ELIQUIS) tablet 2.5 mg Start: 01/23/21 2030 Code Status:   Code Status: DNR  Family Communication: plan of care discussed with patient at bedside. Status is:  Inpatient Remains inpatient appropriate because:Inpatient level of care appropriate due to severity of illness Dispo: The patient is from: Home              Anticipated d/c is to: Home              Patient currently is not medically stable to d/c.   Difficult to place patient No       Unresulted Labs (From admission, onward)    None       Medications reviewed:  Scheduled Meds:  amLODipine  10 mg Oral Daily   apixaban  2.5 mg Oral BID   levothyroxine  100 mcg Oral Q0600   losartan  25 mg Oral Daily   pravastatin  20 mg Oral Daily   Continuous Infusions: Consultants:see note  Procedures:see note Antimicrobials: Anti-infectives (From admission, onward)    None      Culture/Microbiology    Component Value Date/Time   SDES  02/26/2020 1621    BLOOD RIGHT ANTECUBITAL Performed at [facility], [address], [place], [place] [address]    [facility]  02/26/2020 1621    BOTTLES DRAWN AEROBIC AND ANAEROBIC Blood Culture adequate volume Performed at [facility], [address], [place], [place] [address]    CULT  02/26/2020 1621    NO GROWTH 5 DAYS Performed at [facility], [address], [place], [place] [address]    REPTSTATUS 03/02/2020 FINAL 02/26/2020 1621    Other culture-see note  Objective: Vitals: Today's Vitals   01/24/21 0512 01/24/21 0825 01/24/21 0840 01/24/21 1007  BP: (!) 182/64 (!) 150/127 (!) 158/76 (!) 178/66  Pulse: 68 (!) 55 (!) 59 (!) 57  Resp: 18     Temp: 98 F (36.7 C)     TempSrc: Axillary     SpO2: 99% (!) 77%    Height:      PainSc:   0-No pain     Intake/Output Summary (Last 24 hours) at 01/24/2021 1138 Last data filed at 01/24/2021 1054 Gross per 24 hour  Intake 55 ml  Output 500 ml  Net -445 ml   There were no vitals filed for this visit. Weight change:   Intake/Output from previous day: 08/15 0701 - 08/16 0700 In: -  Out: 500 [Urine:500] Intake/Output this shift: Total I/O In: 55  [P.O.:55] Out: -  There were no vitals filed for this visit. Examination: General exam: AAOx3, elderly female, pleasant, not in acute distress. HEENT:Oral mucosa moist, Ear/Nose WNL grossly,dentition normal. Respiratory system: bilaterally diminished,no use of accessory muscle, non tender. Cardiovascular system: S1 & S2 +,No JVD. Gastrointestinal system: Abdomen soft, NT,ND, BS+. Nervous System:Alert, awake, moving extremities Extremities: no edema, distal peripheral pulses palpable.  Skin: No rashes,no icterus. MSK: Normal muscle bulk,tone, power Data Reviewed: I have personally reviewed following labs and imaging studies CBC: Recent Labs  Lab 01/23/21 1359 01/24/21 0423  WBC 9.7 8.5  NEUTROABS 7.6  --   HGB 13.4 12.5  HCT 41.0 39.6  MCV 90.9 93.8  PLT 266 263   Basic Metabolic Panel: Recent Labs  Lab 01/23/21 1359 01/24/21 0423  NA 139 143  K 4.5 3.8  CL 103 110  CO2 27 22  GLUCOSE 106* 103*  BUN 33* 27*  CREATININE 1.77* 1.40*  CALCIUM 9.0 9.2   GFR: CrCl cannot be calculated (Unknown ideal weight.). Liver Function Tests: Recent Labs  Lab 01/23/21 1359 01/24/21 0423  AST 17 15  ALT 11 11  ALKPHOS 56 43  BILITOT 0.5 0.7  PROT 7.4 6.1*  ALBUMIN 4.0 3.4*   No results for input(s): LIPASE, AMYLASE in the last 168 hours. No results for input(s): AMMONIA in the last 168 hours. Coagulation Profile: No results for input(s): INR, PROTIME in the last 168 hours. Cardiac Enzymes: No results for input(s): CKTOTAL, CKMB, CKMBINDEX, TROPONINI in the last 168 hours. BNP (last 3 results) No results for input(s): PROBNP in the last 8760 hours. HbA1C: No results for input(s): HGBA1C in the last 72 hours. CBG: No results for input(s): GLUCAP in the last 168 hours. Lipid Profile: No results for input(s): CHOL, HDL, LDLCALC, TRIG, CHOLHDL, LDLDIRECT in the last 72 hours. Thyroid Function Tests: No results for input(s): TSH, T4TOTAL, FREET4, T3FREE, THYROIDAB in the  last 72 hours. Anemia Panel: No results for input(s): VITAMINB12, FOLATE, FERRITIN, TIBC, IRON, RETICCTPCT in the last 72 hours. Sepsis Labs: No results for input(s): PROCALCITON, LATICACIDVEN in the last 168 hours.  Recent Results (from the past 240 hour(s))  Resp Panel by RT-PCR (Flu A&B, Covid) Nasopharyngeal Swab     Status: None   Collection Time: 01/23/21  1:59 PM   Specimen: Nasopharyngeal Swab; Nasopharyngeal(NP) swabs in vial transport medium  Result Value Ref Range Status   SARS Coronavirus 2 by RT PCR NEGATIVE NEGATIVE Final    Comment: (NOTE) SARS-CoV-2 target nucleic acids are NOT DETECTED.  The SARS-CoV-2 RNA is generally detectable in upper respiratory specimens during the acute phase of infection. The lowest concentration of  SARS-CoV-2 viral copies this assay can detect is 138 copies/mL. A negative result does not preclude SARS-Cov-2 infection and should not be used as the sole basis for treatment or other patient management decisions. A negative result may occur with  improper specimen collection/handling, submission of specimen other than nasopharyngeal swab, presence of viral mutation(s) within the areas targeted by this assay, and inadequate number of viral copies(<138 copies/mL). A negative result must be combined with clinical observations, patient history, and epidemiological information. The expected result is Negative.  Fact Sheet for Patients:  BloggerCourse.com  Fact Sheet for Healthcare Providers:  SeriousBroker.it  This test is no t yet approved or cleared by the [place] FDA and  has been authorized for detection and/or diagnosis of SARS-CoV-2 by FDA under an Emergency Use Authorization (EUA). This EUA will remain  in effect (meaning this test can be used) for the duration of the COVID-19 declaration under Section 564(b)(1) of the Act, 21 U.S.C.section 360bbb-3(b)(1), unless the authorization is  terminated  or revoked sooner.       Influenza A by PCR NEGATIVE NEGATIVE Final   Influenza B by PCR NEGATIVE NEGATIVE Final    Comment: (NOTE) The Xpert Xpress SARS-CoV-2/FLU/RSV plus assay is intended as an aid in the diagnosis of influenza from Nasopharyngeal swab specimens and should not be used as a sole basis for treatment. Nasal washings and aspirates are unacceptable for Xpert Xpress SARS-CoV-2/FLU/RSV testing.  Fact Sheet for Patients: BloggerCourse.com  Fact Sheet for Healthcare Providers: SeriousBroker.it  This test is not yet approved or cleared by the [place] FDA and has been authorized for detection and/or diagnosis of SARS-CoV-2 by FDA under an Emergency Use Authorization (EUA). This EUA will remain in effect (meaning this test can be used) for the duration of the COVID-19 declaration under Section 564(b)(1) of the Act, 21 U.S.C. section 360bbb-3(b)(1), unless the authorization is terminated or revoked.  Performed at [facility] [address] [place], [place] [address]     Radiology Studies: CT HEAD WO CONTRAST ( )  Result Date: 01/23/2021 CLINICAL DATA:  Neuro deficit, headache, dizziness since Friday EXAM: CT HEAD WITHOUT CONTRAST TECHNIQUE: Contiguous axial images were obtained from the base of the skull through the vertex without intravenous contrast. COMPARISON:  None. FINDINGS: Brain: There is no acute intracranial hemorrhage, extra-axial fluid collection, or infarct. There is moderate parenchymal volume loss with ex vacuo dilatation of the ventricular system. There is right worse than left hippocampal atrophy foci of hypodensity in the subcortical and periventricular white matter nonspecific but likely reflect sequela of chronic white matter microangiopathy. There is a remote lacunar infarct in the left basal ganglia. There is no mass lesion. There is no midline shift. Vascular: There is  calcification of the bilateral cavernous ICAs. Skull: Normal. Negative for fracture or focal lesion. Sinuses/Orbits: The imaged paranasal sinuses are clear. The imaged globes and orbits are unremarkable. Other: None. IMPRESSION: No acute intracranial pathology. Electronically Signed   By: Lesia Hausen M.D.   On: 01/23/2021 14:45   DG Chest Portable 1 View  Result Date: 01/23/2021 CLINICAL DATA:  Dizziness.  Headache. EXAM: PORTABLE CHEST 1 VIEW COMPARISON:  Chest x-ray 02/26/2020, CT chest 02/19/2008 FINDINGS: The heart size and mediastinal contours are unchanged. Aortic calcification. Question hazy interstitial opacities in the peripheral left lower lung. Otherwise no focal consolidation. No pulmonary edema. No pleural effusion. No pneumothorax. No acute osseous abnormality. IMPRESSION: Question hazy interstitial opacities in the peripheral left lower lung. Electronically Signed  By: Tish FredericksonMorgane  Naveau M.D.   On: 01/23/2021 15:12     LOS: 1 day   Lanae Boastamesh Izabel Chim, MD [facility]  01/24/2021, 11:38 AM

## 2021-01-24 NOTE — Care Management CC44 (Signed)
Condition Code 44 Documentation Completed  Patient Details  Name: KAMIRAH SHUGRUE MRN: [id number] Date of Birth: 1920-07-27   Condition Code 44 given:  Yes Patient signature on Condition Code 44 notice:  Yes Documentation of 2 MD's agreement:  Yes Code 44 added to claim:  Yes    Darleene Cleaver, LCSW 01/24/2021, 4:49 PM

## 2021-01-24 NOTE — Care Management Obs Status (Signed)
MEDICARE OBSERVATION STATUS NOTIFICATION   Patient Details  Name: Stacy Huang MRN: [id number] Date of Birth: 10-13-1920   Medicare Observation Status Notification Given:  Yes    Halford Chessman 01/24/2021, 4:48 PM

## 2021-01-25 DIAGNOSIS — N1832 Chronic kidney disease, stage 3b: Secondary | ICD-10-CM

## 2021-01-25 DIAGNOSIS — I160 Hypertensive urgency: Secondary | ICD-10-CM | POA: Diagnosis not present

## 2021-01-25 LAB — BASIC METABOLIC PANEL
Anion gap: 7 (ref 5–15)
BUN: 29 mg/dL — ABNORMAL HIGH (ref 8–23)
CO2: 26 mmol/L (ref 22–32)
Calcium: 8.9 mg/dL (ref 8.9–10.3)
Chloride: 109 mmol/L (ref 98–111)
Creatinine, Ser: 1.61 mg/dL — ABNORMAL HIGH (ref 0.44–1.00)
GFR, Estimated: 29 mL/min — ABNORMAL LOW (ref >60)
Glucose, Bld: 106 mg/dL — ABNORMAL HIGH (ref 70–99)
Potassium: 3.9 mmol/L (ref 3.5–5.1)
Sodium: 142 mmol/L (ref 135–145)

## 2021-01-25 MED ORDER — LOSARTAN POTASSIUM 50 MG PO TABS
50.0000 mg | ORAL_TABLET | Freq: Every day | ORAL | Status: DC
  Administered 2021-01-25: 50 mg via ORAL
  Filled 2021-01-25: qty 1

## 2021-01-25 MED ORDER — LOSARTAN POTASSIUM 50 MG PO TABS: 50.0000 mg | ORAL_TABLET | Freq: Every day | ORAL | 2 refills | Status: DC

## 2021-01-25 MED ORDER — HYDRALAZINE HCL 25 MG PO TABS
25.0000 mg | ORAL_TABLET | Freq: Four times a day (QID) | ORAL | Status: DC | PRN
  Administered 2021-01-25: 25 mg via ORAL
  Filled 2021-01-25: qty 1

## 2021-01-25 MED ORDER — AMLODIPINE BESYLATE 10 MG PO TABS: 10.0000 mg | ORAL_TABLET | Freq: Every day | ORAL | 2 refills | Status: DC

## 2021-01-25 NOTE — Discharge Summary (Signed)
Physician Discharge Summary  Stacy Huang:[id number] DOB: [birth year] DOA: 01/23/2021  PCP: Doreen Salvage, PA-C  Admit date: 01/23/2021 Discharge date: 01/25/2021  Time spent: 50 minutes  Recommendations for Outpatient Follow-up:  Follow-up PCP in 2 weeks   Discharge Diagnoses:  Principal Problem:   Hypertensive urgency Active Problems:   Generalized weakness   CKD (chronic kidney disease) stage 3, GFR 30-59 ml/min (HCC)   Discharge Condition: Stable  Diet recommendation: Heart healthy diet  There were no vitals filed for this visit.  History of present illness:  87 year old female with medical history of essential hypertension, hypothyroidism, history of shoulder fracture, CKD stage IIIb, previous COVID-19 infection presented to med [facility] with dizziness and headache.  She was found to have hypertensive urgency with BP 249/73.  Head CT showed no acute finding.  Chest x-ray was clear.  Hospital Course:  Hypertensive urgency -Resolved  Hypertension -Patient takes amlodipine 5 mg daily along with losartan 25 mg daily -Dose of amlodipine changed to 10 mg daily, losartan increased to 50 mg daily -Blood pressure is better controlled  Hypothyroidism -Continue Synthroid  CKD stage IIIb -Creatinine stable    Procedures: None  Consultations:   Discharge Exam: Vitals:   01/25/21 1137 01/25/21 1200  BP: (!) 187/65 (!) 178/68  Pulse: 64 69  Resp: 18   Temp: 97.9 F (36.6 C)   SpO2: 100%     General: Appears in no acute distress Cardiovascular: S1-S2, regular Respiratory: Clear to auscultation bilaterally  Discharge Instructions   Discharge Instructions     Diet - low sodium heart healthy   Complete by: As directed    Increase activity slowly   Complete by: As directed       Allergies as of 01/25/2021       Reactions   Lactose Intolerance (gi) Other (See Comments)   Upset stomach        Medication List     TAKE these  medications    acetaminophen 325 MG tablet Commonly known as: TYLENOL Take 2 tablets (650 mg total) by mouth every 6 (six) hours as needed for mild pain or headache (fever >/= 101).   amLODipine 10 MG tablet Commonly known as: NORVASC Take 1 tablet (10 mg total) by mouth daily. What changed: Another medication with the same name was removed. Continue taking this medication, and follow the directions you see here.   apixaban 2.5 MG Tabs tablet Commonly known as: ELIQUIS Take 2.5 mg by mouth 2 (two) times daily. What changed: Another medication with the same name was removed. Continue taking this medication, and follow the directions you see here.   guaiFENesin-dextromethorphan 100-10 MG/5ML syrup Commonly known as: ROBITUSSIN DM Take 5 mLs by mouth every 6 (six) hours as needed for cough.   levothyroxine 100 MCG tablet Commonly known as: SYNTHROID Take 100 mcg by mouth daily before breakfast. What changed: Another medication with the same name was removed. Continue taking this medication, and follow the directions you see here.   losartan 50 MG tablet Commonly known as: COZAAR Take 1 tablet (50 mg total) by mouth daily. Start taking on: January 26, 2021 What changed:  medication strength how much to take   pravastatin 20 MG tablet Commonly known as: PRAVACHOL Take 20 mg by mouth daily.       Allergies  Allergen Reactions   Lactose Intolerance (Gi) Other (See Comments)    Upset stomach    Follow-up Information     [place], Dorinda Hill, PA-C  Follow up in 2 week(s).   Specialty: Internal Medicine Contact information: [address] [place] [address] [phone]                  The results of significant diagnostics from this hospitalization (including imaging, microbiology, ancillary and laboratory) are listed below for reference.    Significant Diagnostic Studies: CT HEAD WO CONTRAST ( )  Result Date: 01/23/2021 CLINICAL DATA:  Neuro deficit,  headache, dizziness since Friday EXAM: CT HEAD WITHOUT CONTRAST TECHNIQUE: Contiguous axial images were obtained from the base of the skull through the vertex without intravenous contrast. COMPARISON:  None. FINDINGS: Brain: There is no acute intracranial hemorrhage, extra-axial fluid collection, or infarct. There is moderate parenchymal volume loss with ex vacuo dilatation of the ventricular system. There is right worse than left hippocampal atrophy foci of hypodensity in the subcortical and periventricular white matter nonspecific but likely reflect sequela of chronic white matter microangiopathy. There is a remote lacunar infarct in the left basal ganglia. There is no mass lesion. There is no midline shift. Vascular: There is calcification of the bilateral cavernous ICAs. Skull: Normal. Negative for fracture or focal lesion. Sinuses/Orbits: The imaged paranasal sinuses are clear. The imaged globes and orbits are unremarkable. Other: None. IMPRESSION: No acute intracranial pathology. Electronically Signed   By: Lesia Hausen M.D.   On: 01/23/2021 14:45   DG Chest Portable 1 View  Result Date: 01/23/2021 CLINICAL DATA:  Dizziness.  Headache. EXAM: PORTABLE CHEST 1 VIEW COMPARISON:  Chest x-ray 02/26/2020, CT chest 02/19/2008 FINDINGS: The heart size and mediastinal contours are unchanged. Aortic calcification. Question hazy interstitial opacities in the peripheral left lower lung. Otherwise no focal consolidation. No pulmonary edema. No pleural effusion. No pneumothorax. No acute osseous abnormality. IMPRESSION: Question hazy interstitial opacities in the peripheral left lower lung. Electronically Signed   By: Tish Frederickson M.D.   On: 01/23/2021 15:12    Microbiology: Recent Results (from the past 240 hour(s))  Resp Panel by RT-PCR (Flu A&B, Covid) Nasopharyngeal Swab     Status: None   Collection Time: 01/23/21  1:59 PM   Specimen: Nasopharyngeal Swab; Nasopharyngeal(NP) swabs in vial transport medium   Result Value Ref Range Status   SARS Coronavirus 2 by RT PCR NEGATIVE NEGATIVE Final    Comment: (NOTE) SARS-CoV-2 target nucleic acids are NOT DETECTED.  The SARS-CoV-2 RNA is generally detectable in upper respiratory specimens during the acute phase of infection. The lowest concentration of SARS-CoV-2 viral copies this assay can detect is 138 copies/mL. A negative result does not preclude SARS-Cov-2 infection and should not be used as the sole basis for treatment or other patient management decisions. A negative result may occur with  improper specimen collection/handling, submission of specimen other than nasopharyngeal swab, presence of viral mutation(s) within the areas targeted by this assay, and inadequate number of viral copies(<138 copies/mL). A negative result must be combined with clinical observations, patient history, and epidemiological information. The expected result is Negative.  Fact Sheet for Patients:  BloggerCourse.com  Fact Sheet for Healthcare Providers:  SeriousBroker.it  This test is no t yet approved or cleared by the [place] FDA and  has been authorized for detection and/or diagnosis of SARS-CoV-2 by FDA under an Emergency Use Authorization (EUA). This EUA will remain  in effect (meaning this test can be used) for the duration of the COVID-19 declaration under Section 564(b)(1) of the Act, 21 U.S.C.section 360bbb-3(b)(1), unless the authorization is terminated  or revoked sooner.  Influenza A by PCR NEGATIVE NEGATIVE Final   Influenza B by PCR NEGATIVE NEGATIVE Final    Comment: (NOTE) The Xpert Xpress SARS-CoV-2/FLU/RSV plus assay is intended as an aid in the diagnosis of influenza from Nasopharyngeal swab specimens and should not be used as a sole basis for treatment. Nasal washings and aspirates are unacceptable for Xpert Xpress SARS-CoV-2/FLU/RSV testing.  Fact Sheet for  Patients: BloggerCourse.com  Fact Sheet for Healthcare Providers: SeriousBroker.it  This test is not yet approved or cleared by the [place] FDA and has been authorized for detection and/or diagnosis of SARS-CoV-2 by FDA under an Emergency Use Authorization (EUA). This EUA will remain in effect (meaning this test can be used) for the duration of the COVID-19 declaration under Section 564(b)(1) of the Act, 21 U.S.C. section 360bbb-3(b)(1), unless the authorization is terminated or revoked.  Performed at [facility] [address] [place], [place] [address]      Labs: Basic Metabolic Panel: Recent Labs  Lab 01/23/21 1359 01/24/21 0423 01/25/21 0509  NA 139 143 142  K 4.5 3.8 3.9  CL 103 110 109  CO2 27 22 26   GLUCOSE 106* 103* 106*  BUN 33* 27* 29*  CREATININE 1.77* 1.40* 1.61*  CALCIUM 9.0 9.2 8.9   Liver Function Tests: Recent Labs  Lab 01/23/21 1359 01/24/21 0423  AST 17 15  ALT 11 11  ALKPHOS 56 43  BILITOT 0.5 0.7  PROT 7.4 6.1*  ALBUMIN 4.0 3.4*   No results for input(s): LIPASE, AMYLASE in the last 168 hours. No results for input(s): AMMONIA in the last 168 hours. CBC: Recent Labs  Lab 01/23/21 1359 01/24/21 0423  WBC 9.7 8.5  NEUTROABS 7.6  --   HGB 13.4 12.5  HCT 41.0 39.6  MCV 90.9 93.8  PLT 266 263        Signed:  01/26/21 MD.  [facility] 01/25/2021, 1:03 PM

## 2021-01-25 NOTE — Evaluation (Signed)
Occupational Therapy Evaluation Patient Details Name: Stacy Huang MRN: [id number] DOB: 10-Sep-1920 Today's Date: 01/25/2021    History of Present Illness Patient is a 87 year old female presented to med Center [facility] with dizziness and headache, pt diagnosed with hypertensive urgency. PMH includes essential hypertension, hypothyroidism, history of shoulder fracture, chronic kidney disease stage IIIb, and previous COVID-19 infection   Clinical Impression   Patient lives with son, is typically independent with basic self care tasks, using walker to ambulate and son performs IADL tasks. Patient has personal cane in room and initially stating she uses it for ambulation as well, note patient is unsteady reaching out for furniture and min A for safety. Educate patient to utilize walker to maximize balance + safety with ambulation. BP reading 201/72, patient reports mild dizziness upon sitting in recliner "better than it has been." Recommend continued acute OT services to maximize patient safety and independence with BADL tasks in order to facilitate D/C home with family support.      Follow Up Recommendations  No OT follow up;Supervision/Assistance - 24 hour    Equipment Recommendations  None recommended by OT           Precautions / Restrictions Precautions Precautions: Fall Precaution Comments: monitor BP Restrictions Weight Bearing Restrictions: No      Mobility Bed Mobility               General bed mobility comments: in chair    Transfers Overall transfer level: Needs assistance Equipment used: Straight cane Transfers: Sit to/from Stand Sit to Stand: Min assist         General transfer comment: mildly unsteady    Balance Overall balance assessment: Needs assistance Sitting-balance support: Feet supported Sitting balance-Leahy Scale: Good     Standing balance support: Single extremity supported Standing balance-Leahy Scale: Poor Standing balance  comment: reliant on at least unilateral UE support and min A for safety                           ADL either performed or assessed with clinical judgement   ADL Overall ADL's : Needs assistance/impaired Eating/Feeding: Set up;Sitting   Grooming: Set up;Sitting   Upper Body Bathing: Set up;Sitting   Lower Body Bathing: Minimal assistance;Sit to/from stand   Upper Body Dressing : Set up;Sitting   Lower Body Dressing: Minimal assistance;Sit to/from stand Lower Body Dressing Details (indicate cue type and reason): for balance in standing Toilet Transfer: Minimal assistance;Ambulation (cane) Toilet Transfer Details (indicate cue type and reason): patient has personal cane in room, reaching out for furniture to steady and needing min A for safety. Encourage patient to utilize walker vs cane to maximize safety Toileting- Clothing Manipulation and Hygiene: Minimal assistance;Sit to/from stand       Functional mobility during ADLs: Minimal assistance;Cane                           Pertinent Vitals/Pain Pain Assessment: No/denies pain     Hand Dominance Right   Extremity/Trunk Assessment Upper Extremity Assessment Upper Extremity Assessment: Generalized weakness   Lower Extremity Assessment Lower Extremity Assessment: Defer to PT evaluation       Communication Communication Communication: HOH   Cognition Arousal/Alertness: Awake/alert Behavior During Therapy: WFL for tasks assessed/performed Overall Cognitive Status: Within Functional Limits for tasks assessed  General Comments: grossly oriented, knows in hospital but cannot recall name of it   General Comments  BP taken in chair 201/72, patient reports mild dizziness post ambulation but "much better than has been"            Home Living Family/patient expects to be discharged to:: Private residence Living Arrangements: Children (son) Available Help at  Discharge: Family;Available 24 hours/day;Other (Comment) Type of Home: House Home Access: Level entry     Home Layout: One level     Bathroom Shower/Tub: Chief Strategy Officer: Standard     Home Equipment: Cane - single point;Shower seat;Walker - 4 wheels   Additional Comments: lives with son who is retired      Prior Functioning/Environment Level of Independence: Needs Financial planner / Transfers Assistance Needed: ambulates with rollator ADL's / Homemaking Assistance Needed: patient reports she can perform BADL, son performs IADLs            OT Problem List: Decreased activity tolerance;Impaired balance (sitting and/or standing);Decreased safety awareness      OT Treatment/Interventions: Self-care/ADL training;Balance training;Patient/family education;Therapeutic activities;DME and/or AE instruction    OT Goals(Current goals can be found in the care plan section) Acute Rehab OT Goals Patient Stated Goal: figure out why BP so high OT Goal Formulation: With patient Time For Goal Achievement: 02/08/21 Potential to Achieve Goals: Good  OT Frequency: Min 2X/week                             AM-PAC OT "6 Clicks" Daily Activity     Outcome Measure Help from another person eating meals?: A Little Help from another person taking care of personal grooming?: A Little Help from another person toileting, which includes using toliet, bedpan, or urinal?: A Little Help from another person bathing (including washing, rinsing, drying)?: A Little Help from another person to put on and taking off regular upper body clothing?: A Little Help from another person to put on and taking off regular lower body clothing?: A Little 6 Click Score: 18   End of Session Equipment Utilized During Treatment: Other (comment) (cane) Nurse Communication: Mobility status  Activity Tolerance: Patient tolerated treatment well Patient left: in chair;with call bell/phone within  reach  OT Visit Diagnosis: Unsteadiness on feet (R26.81)                Time: 1683-7290 OT Time Calculation (min): 18 min Charges:  OT General Charges $OT Visit: 1 Visit OT Evaluation $OT Eval Low Complexity: 1 Low  Stacy Huang OT OT pager: [phone]  Stacy Huang 01/25/2021, 9:26 AM

## 2021-01-25 NOTE — Evaluation (Signed)
Physical Therapy Evaluation Patient Details Name: Stacy Huang MRN: [id number] DOB: 1920-08-07 Today's Date: 01/25/2021   History of Present Illness  Patient is a 87 year old female presented to med Center [facility] with dizziness and headache, pt diagnosed with hypertensive urgency. PMH includes essential hypertension, hypothyroidism, history of shoulder fracture, chronic kidney disease stage IIIb, and previous COVID-19 infection  Clinical Impression  The patient's BP 201/72, HR 54. Patient unsteady just using her cane. Recommended that she use her 4 wheeled RW at home.Limited ambulation due to increased BP. Pt admitted with above diagnosis.  Pt currently with functional limitations due to the deficits listed below (see PT Problem List). Pt will benefit from skilled PT to increase their independence and safety with mobility to allow discharge to the venue listed below.       Follow Up Recommendations Home health PT    Equipment Recommendations  None recommended by PT    Recommendations for Other Services       Precautions / Restrictions Precautions Precautions: Fall Precaution Comments: monitor BP      Mobility  Bed Mobility               General bed mobility comments: in chair    Transfers Overall transfer level: Needs assistance Equipment used: Straight cane Transfers: Sit to/from Stand Sit to Stand: Min assist         General transfer comment: mildly unsteady  Ambulation/Gait Ambulation/Gait assistance: Min assist Gait Distance (Feet): 40 Feet Assistive device: Straight cane Gait Pattern/deviations: Step-through pattern;Drifts right/left Gait velocity: decr   General Gait Details: requires steady assist around the room, holding to bed  Stairs            Wheelchair Mobility    Modified Rankin (Stroke Patients Only)       Balance Overall balance assessment: Needs assistance Sitting-balance support: Feet supported Sitting balance-Leahy  Scale: Good     Standing balance support: Single extremity supported Standing balance-Leahy Scale: Poor Standing balance comment: reliant on at least unilateral UE support and min A for safety                             Pertinent Vitals/Pain Pain Assessment: No/denies pain    Home Living Family/patient expects to be discharged to:: Private residence Living Arrangements: Children Available Help at Discharge: Family;Available 24 hours/day;Other (Comment) Type of Home: House Home Access: Level entry     Home Layout: One level Home Equipment: Cane - single point;Emergency planning/management officer - 4 wheels Additional Comments: lives with son who is retired    Prior Function Level of Independence: Soil scientist / Transfers Assistance Needed: ambulates with rollator  ADL's / Homemaking Assistance Needed: patient reports she can perform BADL, son performs IADLs        Hand Dominance        Extremity/Trunk Assessment   Upper Extremity Assessment Upper Extremity Assessment: Defer to OT evaluation    Lower Extremity Assessment Lower Extremity Assessment: Generalized weakness    Cervical / Trunk Assessment Cervical / Trunk Assessment: Normal  Communication      Cognition Arousal/Alertness: Awake/alert Behavior During Therapy: WFL for tasks assessed/performed Overall Cognitive Status: Within Functional Limits for tasks assessed                                 General Comments: grossly oriented, knows in  hospital but cannot recall name of it      General Comments      Exercises     Assessment/Plan    PT Assessment Patient needs continued PT services  PT Problem List Decreased strength;Decreased balance;Decreased mobility;Decreased activity tolerance;Decreased safety awareness;Decreased knowledge of precautions       PT Treatment Interventions DME instruction;Therapeutic activities;Gait training;Therapeutic exercise;Functional mobility  training    PT Goals (Current goals can be found in the Care Plan section)  Acute Rehab PT Goals Patient Stated Goal: figure out why BP so high PT Goal Formulation: With patient Time For Goal Achievement: 02/08/21 Potential to Achieve Goals: Good    Frequency Min 3X/week   Barriers to discharge        Co-evaluation PT/OT/SLP Co-Evaluation/Treatment: Yes Reason for Co-Treatment: For patient/therapist safety;To address functional/ADL transfers PT goals addressed during session: Mobility/safety with mobility OT goals addressed during session: ADL's and self-care       AM-PAC PT "6 Clicks" Mobility  Outcome Measure Help needed turning from your back to your side while in a flat bed without using bedrails?: A Little Help needed moving from lying on your back to sitting on the side of a flat bed without using bedrails?: A Little Help needed moving to and from a bed to a chair (including a wheelchair)?: A Little Help needed standing up from a chair using your arms (e.g., wheelchair or bedside chair)?: A Little Help needed to walk in hospital room?: A Little Help needed climbing 3-5 steps with a railing? : A Lot 6 Click Score: 17    End of Session   Activity Tolerance: Patient tolerated treatment well Patient left: in chair;with call bell/phone within reach (chair alarm not on when therapist arrived.) Nurse Communication: Mobility status PT Visit Diagnosis: Unsteadiness on feet (R26.81);Difficulty in walking, not elsewhere classified (R26.2)    Time: 2956-2130 PT Time Calculation (min) (ACUTE ONLY): 19 min   Charges:   PT Evaluation $PT Eval Low Complexity: 1 Low          Blanchard Kelch PT Acute Rehabilitation Services Pager [phone] Office [phone]   Rada Hay 01/25/2021, 1:35 PM

## 2021-01-25 NOTE — TOC Transition Note (Signed)
Transition of Care [facility]) - CM/SW Discharge Note   Patient Details  Name: FRANCEE SETZER MRN: [id number] Date of Birth: 12/07/1920  [facility]) CM/SW Contact:  Ida Rogue, LCSW Phone Number: 01/25/2021, 4:03 PM   Clinical Narrative:   Patient who is stable for discharge will return home to [facility] where she reside with her son. He will provide transportation home.  Ms Kaffenberger was seen in follow up to PT recommendation of HH PT.  She states she is open to this service, no preference of providers.  Contacted Denyse Amass with [facility] who confirmed they can provide this service.  No further needs identified. TOC sign off.    Final next level of care: Home w Home Health Services Barriers to Discharge: No Barriers Identified   Patient Goals and CMS Choice        Discharge Placement                       Discharge Plan and Services                                     Social Determinants of Health (SDOH) Interventions     Readmission Risk Interventions No flowsheet data found.

## 2021-01-25 NOTE — Progress Notes (Signed)
Pts son Stephannie Peters called to notify of pts discharge.  Awaiting transport for discharge.

## 2021-04-21 ENCOUNTER — Emergency Department (HOSPITAL_COMMUNITY): Payer: Medicare Other

## 2021-04-21 ENCOUNTER — Other Ambulatory Visit: Payer: Self-pay

## 2021-04-21 ENCOUNTER — Inpatient Hospital Stay (HOSPITAL_COMMUNITY)
Admission: EM | Admit: 2021-04-21 | Discharge: 2021-04-25 | DRG: 377 | Disposition: A | Discharge status: Home Health | Payer: Medicare Other | Attending: Internal Medicine | Admitting: Internal Medicine

## 2021-04-21 ENCOUNTER — Encounter (HOSPITAL_COMMUNITY): Payer: Self-pay | Admitting: Emergency Medicine

## 2021-04-21 DIAGNOSIS — K922 Gastrointestinal hemorrhage, unspecified: Secondary | ICD-10-CM | POA: Diagnosis not present

## 2021-04-21 DIAGNOSIS — S32010A Wedge compression fracture of first lumbar vertebra, initial encounter for closed fracture: Secondary | ICD-10-CM

## 2021-04-21 DIAGNOSIS — K5731 Diverticulosis of large intestine without perforation or abscess with bleeding: Principal | ICD-10-CM | POA: Diagnosis present

## 2021-04-21 DIAGNOSIS — E039 Hypothyroidism, unspecified: Secondary | ICD-10-CM | POA: Diagnosis present

## 2021-04-21 DIAGNOSIS — I10 Essential (primary) hypertension: Secondary | ICD-10-CM

## 2021-04-21 DIAGNOSIS — Z7901 Long term (current) use of anticoagulants: Secondary | ICD-10-CM

## 2021-04-21 DIAGNOSIS — G9341 Metabolic encephalopathy: Secondary | ICD-10-CM | POA: Diagnosis present

## 2021-04-21 DIAGNOSIS — Z79899 Other long term (current) drug therapy: Secondary | ICD-10-CM

## 2021-04-21 DIAGNOSIS — Z8616 Personal history of COVID-19: Secondary | ICD-10-CM

## 2021-04-21 DIAGNOSIS — I129 Hypertensive chronic kidney disease with stage 1 through stage 4 chronic kidney disease, or unspecified chronic kidney disease: Secondary | ICD-10-CM | POA: Diagnosis present

## 2021-04-21 DIAGNOSIS — D72829 Elevated white blood cell count, unspecified: Secondary | ICD-10-CM

## 2021-04-21 DIAGNOSIS — K625 Hemorrhage of anus and rectum: Secondary | ICD-10-CM

## 2021-04-21 DIAGNOSIS — K5900 Constipation, unspecified: Secondary | ICD-10-CM

## 2021-04-21 DIAGNOSIS — Z79890 Hormone replacement therapy (postmenopausal): Secondary | ICD-10-CM

## 2021-04-21 DIAGNOSIS — F05 Delirium due to known physiological condition: Secondary | ICD-10-CM | POA: Diagnosis present

## 2021-04-21 DIAGNOSIS — N179 Acute kidney failure, unspecified: Secondary | ICD-10-CM | POA: Diagnosis present

## 2021-04-21 DIAGNOSIS — D72828 Other elevated white blood cell count: Secondary | ICD-10-CM | POA: Diagnosis present

## 2021-04-21 DIAGNOSIS — N1832 Chronic kidney disease, stage 3b: Secondary | ICD-10-CM | POA: Diagnosis present

## 2021-04-21 DIAGNOSIS — Z86718 Personal history of other venous thrombosis and embolism: Secondary | ICD-10-CM

## 2021-04-21 DIAGNOSIS — D62 Acute posthemorrhagic anemia: Secondary | ICD-10-CM | POA: Diagnosis present

## 2021-04-21 DIAGNOSIS — Z66 Do not resuscitate: Secondary | ICD-10-CM | POA: Diagnosis present

## 2021-04-21 DIAGNOSIS — E785 Hyperlipidemia, unspecified: Secondary | ICD-10-CM | POA: Diagnosis present

## 2021-04-21 HISTORY — DX: Essential (primary) hypertension: I10

## 2021-04-21 LAB — CBC WITH DIFFERENTIAL/PLATELET
Abs Immature Granulocytes: 0.07 10*3/uL (ref 0.00–0.07)
Basophils Absolute: 0.1 10*3/uL (ref 0.0–0.1)
Basophils Relative: 0 %
Eosinophils Absolute: 0 10*3/uL (ref 0.0–0.5)
Eosinophils Relative: 0 %
HCT: 30.6 % — ABNORMAL LOW (ref 36.0–46.0)
Hemoglobin: 9.8 g/dL — ABNORMAL LOW (ref 12.0–15.0)
Immature Granulocytes: 1 %
Lymphocytes Relative: 4 %
Lymphs Abs: 0.6 10*3/uL — ABNORMAL LOW (ref 0.7–4.0)
MCH: 29.9 pg (ref 26.0–34.0)
MCHC: 32 g/dL (ref 30.0–36.0)
MCV: 93.3 fL (ref 80.0–100.0)
Monocytes Absolute: 0.2 10*3/uL (ref 0.1–1.0)
Monocytes Relative: 1 %
Neutro Abs: 13 10*3/uL — ABNORMAL HIGH (ref 1.7–7.7)
Neutrophils Relative %: 94 %
Platelets: 307 10*3/uL (ref 150–400)
RBC: 3.28 MIL/uL — ABNORMAL LOW (ref 3.87–5.11)
RDW: 13.2 % (ref 11.5–15.5)
WBC: 13.9 10*3/uL — ABNORMAL HIGH (ref 4.0–10.5)
nRBC: 0 % (ref 0.0–0.2)

## 2021-04-21 LAB — POC OCCULT BLOOD, ED: Fecal Occult Bld: POSITIVE — AB

## 2021-04-21 LAB — COMPREHENSIVE METABOLIC PANEL
ALT: 11 U/L (ref 0–44)
AST: 17 U/L (ref 15–41)
Albumin: 3.7 g/dL (ref 3.5–5.0)
Alkaline Phosphatase: 45 U/L (ref 38–126)
Anion gap: 9 (ref 5–15)
BUN: 39 mg/dL — ABNORMAL HIGH (ref 8–23)
CO2: 23 mmol/L (ref 22–32)
Calcium: 8.7 mg/dL — ABNORMAL LOW (ref 8.9–10.3)
Chloride: 109 mmol/L (ref 98–111)
Creatinine, Ser: 2.02 mg/dL — ABNORMAL HIGH (ref 0.44–1.00)
GFR, Estimated: 22 mL/min — ABNORMAL LOW (ref >60)
Glucose, Bld: 124 mg/dL — ABNORMAL HIGH (ref 70–99)
Potassium: 4.3 mmol/L (ref 3.5–5.1)
Sodium: 141 mmol/L (ref 135–145)
Total Bilirubin: 0.6 mg/dL (ref 0.3–1.2)
Total Protein: 6.7 g/dL (ref 6.5–8.1)

## 2021-04-21 LAB — TYPE AND SCREEN
ABO/RH(D): B POS
Antibody Screen: NEGATIVE

## 2021-04-21 LAB — PROTIME-INR
INR: 1 (ref 0.8–1.2)
Prothrombin Time: 12.9 seconds (ref 11.4–15.2)

## 2021-04-21 LAB — RESP PANEL BY RT-PCR (FLU A&B, COVID) ARPGX2
Influenza A by PCR: NEGATIVE
Influenza B by PCR: NEGATIVE
SARS Coronavirus 2 by RT PCR: NEGATIVE

## 2021-04-21 LAB — HEMOGLOBIN AND HEMATOCRIT, BLOOD
HCT: 27.8 % — ABNORMAL LOW (ref 36.0–46.0)
Hemoglobin: 8.9 g/dL — ABNORMAL LOW (ref 12.0–15.0)

## 2021-04-21 LAB — ABO/RH: ABO/RH(D): B POS

## 2021-04-21 MED ORDER — AMLODIPINE BESYLATE 5 MG PO TABS: 5.0000 mg | ORAL_TABLET | Freq: Every day | ORAL | Status: DC

## 2021-04-21 MED ORDER — PRAVASTATIN SODIUM 20 MG PO TABS
20.0000 mg | ORAL_TABLET | Freq: Every day | ORAL | Status: DC
  Administered 2021-04-22 – 2021-04-24 (×3): 20 mg via ORAL
  Filled 2021-04-21 (×3): qty 1

## 2021-04-21 MED ORDER — ONDANSETRON HCL 4 MG/2ML IJ SOLN: 4.0000 mg | Freq: Four times a day (QID) | INTRAMUSCULAR | Status: DC | PRN

## 2021-04-21 MED ORDER — LEVOTHYROXINE SODIUM 100 MCG PO TABS
100.0000 ug | ORAL_TABLET | Freq: Every day | ORAL | Status: DC
  Administered 2021-04-23 – 2021-04-25 (×3): 100 ug via ORAL
  Filled 2021-04-21 (×3): qty 1

## 2021-04-21 MED ORDER — ACETAMINOPHEN 650 MG RE SUPP: 650.0000 mg | Freq: Four times a day (QID) | RECTAL | Status: DC | PRN

## 2021-04-21 MED ORDER — SODIUM CHLORIDE 0.9 % IV SOLN: INTRAVENOUS | Status: AC

## 2021-04-21 MED ORDER — ONDANSETRON HCL 4 MG PO TABS: 4.0000 mg | ORAL_TABLET | Freq: Four times a day (QID) | ORAL | Status: DC | PRN

## 2021-04-21 MED ORDER — ACETAMINOPHEN 325 MG PO TABS: 650.0000 mg | ORAL_TABLET | Freq: Four times a day (QID) | ORAL | Status: DC | PRN

## 2021-04-21 NOTE — ED Provider Notes (Signed)
Emergency Medicine Provider Triage Evaluation Note  Stacy Huang , a 87 y.o. female  was evaluated in triage.  Pt complains of vaginal and rectal bleeding  Review of Systems  Positive:  Negative: fever  Physical Exam  BP (!) 174/75 (BP Location: Right Arm)   Pulse 86   Temp 98.6 F (37 C) (Oral)   Resp 17   Ht 5\' 2"  (1.575 m)   Wt 64 kg   SpO2 98%   BMI 25.81 kg/m  Gen:   Awake, no distress   Resp:  Normal effort  MSK:   Moves extremities without difficulty  Other:    Medical Decision Making  Medically screening exam initiated at 12:39 PM.  Appropriate orders placed.  Stacy Huang was informed that the remainder of the evaluation will be completed by another provider, this initial triage assessment does not replace that evaluation, and the importance of remaining in the ED until their evaluation is complete.     [name], Elson Areas 04/21/21 1240    [birth year], MD 04/21/21 [phone]

## 2021-04-21 NOTE — ED Triage Notes (Signed)
Patient states she is having rectal bleeding since last night, both her rectum and vagina. Reports dark tarry stools.

## 2021-04-21 NOTE — ED Notes (Signed)
ED Provider at bedside. 

## 2021-04-21 NOTE — ED Provider Notes (Signed)
[place] DEPT Provider Note   CSN: [id number] Arrival date & time: 04/21/21  1207     History No chief complaint on file.   COREN HUSAIN is a 87 y.o. female.  HPI     87yo female with history of hypertension, thyroid disease, CKD, GI bleed with admission to [facility] (negative endoscopy, declined colonoscopy/diverticula on CT), bilateral DVT 2021 on eliquis, presents with concern for rectal bleeding.  Reports that since late last night/this morning has had episodes of rectal bleeding. Reports she feels she needs to have BM but then passes just blood. Passes some blood and has passed blood clots.  Did also state she had dark stool. When she urinated saw some blood too but believes it is coming from rectum.  Does not believe she is having vaginal bleeding on further questioning.  No nausea or vomiting. Has some lower left sided abdominal pain and cramping.  Also reports headaches for last 2-3 years but worse last night. No urinary symptoms. Some lightheadedness. Has been taking eliquis.    Past Medical History:  Diagnosis Date   Hypertension    Shoulder fracture, right    Thyroid disease     Patient Active Problem List   Diagnosis Date Noted   Acute GI bleeding 04/21/2021   History of deep vein thrombosis (DVT) of lower extremity 04/21/2021   Essential hypertension 04/21/2021   Hypothyroidism 04/21/2021   Hyperlipidemia 04/21/2021   Compression fracture of L1 vertebra ([place]) 04/21/2021   CKD (chronic kidney disease) stage 3, GFR 30-59 ml/min ([place]) 03/02/2020   Acute hypoxemic respiratory failure due to COVID-19 ([place]) 02/27/2020   Acute kidney injury superimposed on CKD ([place]) 02/27/2020   Positive D dimer 02/27/2020   Hypertensive urgency 02/27/2020   Generalized weakness 02/27/2020   Pneumonia due to COVID-19 virus 02/26/2020    History reviewed. No pertinent surgical history.   OB History   No obstetric history on file.     History  reviewed. No pertinent family history.  Social History   Tobacco Use   Smoking status: Never   Smokeless tobacco: Never  Vaping Use   Vaping Use: Never used  Substance Use Topics   Alcohol use: No   Drug use: No    Home Medications Prior to Admission medications   Medication Sig Start Date End Date Taking? Authorizing Provider  acetaminophen (TYLENOL) 325 MG tablet Take 2 tablets (650 mg total) by mouth every 6 (six) hours as needed for mild pain or headache (fever >/= 101). 03/02/20   Arrien, Jimmy Picket, MD  amLODipine (NORVASC) 10 MG tablet Take 1 tablet (10 mg total) by mouth daily. 01/25/21 04/25/21  Oswald Hillock, MD  apixaban (ELIQUIS) 2.5 MG TABS tablet Take 2.5 mg by mouth 2 (two) times daily.    [provider]  guaiFENesin-dextromethorphan (ROBITUSSIN DM) 100-10 MG/5ML syrup Take 5 mLs by mouth every 6 (six) hours as needed for cough. 03/02/20   Arrien, Jimmy Picket, MD  levothyroxine (SYNTHROID) 100 MCG tablet Take 100 mcg by mouth daily before breakfast.    [provider]  pravastatin (PRAVACHOL) 20 MG tablet Take 20 mg by mouth daily.    [provider]    Allergies    Lactose and Lactose intolerance (gi)  Review of Systems   Review of Systems  Constitutional:  Negative for fever.  HENT:  Negative for sore throat.   Eyes:  Negative for visual disturbance.  Respiratory:  Negative for cough and shortness of  breath.   Cardiovascular:  Negative for chest pain.  Gastrointestinal:  Positive for anal bleeding and blood in stool. Negative for abdominal pain, diarrhea, nausea and vomiting.  Genitourinary:  Negative for difficulty urinating.  Musculoskeletal:  Negative for back pain and neck pain.  Skin:  Negative for rash.  Neurological:  Positive for dizziness (chronic) and headaches. Negative for syncope.   Physical Exam Updated Vital Signs BP (!) 219/79 (BP Location: Left Arm)   Pulse (!) 55   Temp (!) 97.4 F (36.3 C) (Oral)    Resp 18   Ht 5\' 2"  (1.575 m)   Wt 64 kg   SpO2 100%   BMI 25.81 kg/m   Physical Exam Vitals and nursing note reviewed.  Constitutional:      General: She is not in acute distress.    Appearance: She is well-developed. She is not diaphoretic.  HENT:     Head: Normocephalic and atraumatic.  Eyes:     Conjunctiva/sclera: Conjunctivae normal.  Cardiovascular:     Rate and Rhythm: Normal rate and regular rhythm.  Pulmonary:     Effort: Pulmonary effort is normal. No respiratory distress.  Abdominal:     General: There is no distension.     Palpations: Abdomen is soft.     Tenderness: There is abdominal tenderness (LLQ). There is no guarding.  Genitourinary:    Comments: Bright red blood on rectal exam Musculoskeletal:        General: No tenderness.     Cervical back: Normal range of motion.  Skin:    General: Skin is warm and dry.     Findings: No erythema or rash.  Neurological:     Mental Status: She is alert and oriented to person, place, and time.    ED Results / Procedures / Treatments   Labs (all labs ordered are listed, but only abnormal results are displayed) Labs Reviewed  CBC WITH DIFFERENTIAL/PLATELET - Abnormal; Notable for the following components:      Result Value   WBC 13.9 (*)    RBC 3.28 (*)    Hemoglobin 9.8 (*)    HCT 30.6 (*)    Neutro Abs 13.0 (*)    Lymphs Abs 0.6 (*)    All other components within normal limits  COMPREHENSIVE METABOLIC PANEL - Abnormal; Notable for the following components:   Glucose, Bld 124 (*)    BUN 39 (*)    Creatinine, Ser 2.02 (*)    Calcium 8.7 (*)    GFR, Estimated 22 (*)    All other components within normal limits  HEMOGLOBIN AND HEMATOCRIT, BLOOD - Abnormal; Notable for the following components:   Hemoglobin 8.9 (*)    HCT 27.8 (*)    All other components within normal limits  CBC - Abnormal; Notable for the following components:   WBC 10.6 (*)    RBC 2.85 (*)    Hemoglobin 8.6 (*)    HCT 27.2 (*)    All  other components within normal limits  BASIC METABOLIC PANEL - Abnormal; Notable for the following components:   Glucose, Bld 102 (*)    BUN 38 (*)    Creatinine, Ser 1.83 (*)    GFR, Estimated 25 (*)    All other components within normal limits  POC OCCULT BLOOD, ED - Abnormal; Notable for the following components:   Fecal Occult Bld POSITIVE (*)    All other components within normal limits  RESP PANEL BY RT-PCR (FLU A&B, COVID) [id number]  PROTIME-INR  PROTIME-INR  TYPE AND SCREEN  ABO/RH    EKG None  Radiology CT ABDOMEN PELVIS WO CONTRAST  Result Date: 04/21/2021 CLINICAL DATA:  Rectal bleeding left lower quadrant pain. EXAM: CT ABDOMEN AND PELVIS WITHOUT CONTRAST TECHNIQUE: Multidetector CT imaging of the abdomen and pelvis was performed following the standard protocol without IV contrast. COMPARISON:  CT abdomen and pelvis 10/30/2015. FINDINGS: Lower chest: There is some scarring in the left upper lobe. Hepatobiliary: No focal liver abnormality is seen. No gallstones, gallbladder wall thickening, or biliary dilatation. Pancreas: Unremarkable. No pancreatic ductal dilatation or surrounding inflammatory changes. Spleen: Normal in size without focal abnormality. Adrenals/Urinary Tract: There are numerous bilateral renal cysts, left greater than right. The largest cyst is in the left lower pole measuring 7.1 cm, mildly increased in size. There is no hydronephrosis. The adrenal glands and bladder are within normal limits. Stomach/Bowel: Stomach is within normal limits. Appendix appears normal. No evidence of bowel wall thickening, distention, or inflammatory changes. There is diffuse colonic diverticulosis without evidence for acute diverticulitis. Vascular/Lymphatic: Aortic atherosclerosis. No enlarged abdominal or pelvic lymph nodes. Reproductive: There are calcified uterine fibroids with the largest measuring 3.6 cm. These have mildly increased in size compared to the prior study. Adnexa are  within normal limits. Other: No abdominal wall hernia or abnormality. No abdominopelvic ascites. Musculoskeletal: There is a severe compression fracture of L1 which is new from 2017. There is mild retropulsion of fracture fragment along the superior endplate. This is age indeterminate, but favored as subacute or chronic. IMPRESSION: 1. Diffuse colonic diverticulosis without evidence for diverticulitis. 2. Calcified uterine fibroids. 3. L1 compression fracture is new from 2017 and favored as crit subacute or chronic. Please correlate clinically. 4.  Aortic Atherosclerosis (ICD10-I70.0). Electronically Signed   By: Ronney Asters M.D.   On: 04/21/2021 18:27   CT Head Wo Contrast  Result Date: 04/21/2021 CLINICAL DATA:  Headache. EXAM: CT HEAD WITHOUT CONTRAST TECHNIQUE: Contiguous axial images were obtained from the base of the skull through the vertex without intravenous contrast. COMPARISON:  CT head dated January 23, 2021. FINDINGS: Brain: No evidence of acute infarction, hemorrhage, hydrocephalus, extra-axial collection or mass lesion/mass effect. Stable atrophy and chronic microvascular ischemic changes. Vascular: Atherosclerotic vascular calcification of the carotid siphons. No hyperdense vessel. Skull: Normal. Negative for fracture or focal lesion. Sinuses/Orbits: No acute finding. Other: None. IMPRESSION: 1. No acute intracranial abnormality. 2. Stable atrophy and chronic microvascular ischemic changes. Electronically Signed   By: Titus Dubin M.D.   On: 04/21/2021 18:20    Procedures Procedures   Medications Ordered in ED Medications  0.9 %  sodium chloride infusion ( Intravenous New Bag/Given 04/21/21 2004)  acetaminophen (TYLENOL) tablet 650 mg (has no administration in time range)    Or  acetaminophen (TYLENOL) suppository 650 mg (has no administration in time range)  ondansetron (ZOFRAN) tablet 4 mg (has no administration in time range)    Or  ondansetron (ZOFRAN) injection 4 mg (has no  administration in time range)  levothyroxine (SYNTHROID) tablet 100 mcg (has no administration in time range)  pravastatin (PRAVACHOL) tablet 20 mg (has no administration in time range)  0.9 %  sodium chloride infusion (0 mLs Intravenous Hold 04/22/21 0938)  hydrALAZINE (APRESOLINE) injection 5 mg (5 mg Intravenous Given 04/22/21 [id number])    ED Course  I have reviewed the triage vital signs and the nursing notes.  Pertinent labs & imaging results that were available during my care of the patient were reviewed by  me and considered in my medical decision making (see chart for details).    MDM Rules/Calculators/A&P                            87yo female with history of hypertension, thyroid disease, CKD, GI bleed with admission to [facility] (negative endoscopy, declined colonoscopy/diverticula on CT), bilateral DVT 2021 on eliquis, presents with concern for rectal bleeding.  Also reports chronic headache with worsening last night,CT performed shows no evidence of ICH or other acute abnormalities.  Rectal exam with bright red blood. CT shows diverticula without diverticulitis.  Hgb down to 9 from previously normal months ago.  Suspect lower GI bleed, likely diverticular by history and known diverticula. Consulted GI, Dr. Ewing Schlein, states she can have clears.  She is hypertensive.  Will hold eliquis, do not see indication for emergent reversal at this time. Will admit for continued care.    Final Clinical Impression(s) / ED Diagnoses Final diagnoses:  Rectal bleeding  Acute blood loss anemia    Rx / DC Orders ED Discharge Orders     None        Alvira Monday, MD 04/22/21 1121

## 2021-04-21 NOTE — H&P (Signed)
History and Physical    Stacy Huang [id number] DOB: 12-28-1920 DOA: 04/21/2021  PCP: Doreen Salvage, PA-C  Patient coming from: Home  I have personally briefly reviewed patient's old medical records in [facility]  Chief Complaint: Rectal bleeding  HPI: Stacy Huang is a 87 y.o. female with medical history significant for bilateral lower extremity DVT (02/2020) on Eliquis, CKD stage IIIb, HTN, HLD, and hypothyroidism who presented to the ED for evaluation of rectal bleeding.  Patient reports new onset of rectal bleeding evening of 04/20/2021.  She says she had 4-5 episodes of dark red blood per rectum.  She had some mild left-sided abdominal pain which has resolved.  She has not had any nausea or vomiting.  She has not had any lightheadedness, dizziness, chest pain, dyspnea.  She was started on low-dose Eliquis 2.5 mg twice daily in September 2021 when she was found to have bilateral lower extremity DVTs while in the hospital with COVID-19 pneumonia.  It appears this was planned for a 68-month course however it seems patient remains on Eliquis.  Patient does have a history of prior rectal bleeding in May 2017 which time she was admitted at [facility].  At that time this was felt related to diverticulosis versus NSAID use.  EGD was performed and reportedly unremarkable.  Colonoscopy was declined by patient/family at the time per prior documentation.  Patient states that she lives with her son.  She ambulates with the use of a cane but also has a walker and wheelchair at home.  ED Course:  Initial vitals show BP 174/75, pulse 86, RR 17, temp 98.6 F, SPO2 98% on room air.  Labs show hemoglobin 9.8 (previously 12.5 on 01/24/2021), WBC 13.9, hematocrit 30.6, MCV 93.3, platelets 307,000, INR 1.0, sodium 141, potassium 4.3, bicarb 23, BUN 39, creatinine 2.02 (baseline 1.5-1.6), serum glucose 124, LFTs within normal limits.  FOBT is positive.  SARS-CoV-2 PCR panel is ordered  and pending collection.  CT head without contrast is negative for acute intracranial abnormality.  Stable atrophy and chronic microvascular ischemic changes noted.  CT abdomen/pelvis without contrast shows diffuse colonic diverticulosis without evidence for diverticulitis.  Calcified uterine fibroids seen.  Severe L1 compression fracture is seen, new from 2017, favored to be subacute or chronic per radiology read.  EDP discussed with on-call GI who recommended clear liquid diet now, n.p.o. after midnight.  The hospitalist service was consulted to admit for further evaluation and management.  Review of Systems: All systems reviewed and are negative except as documented in history of present illness above.   Past Medical History:  Diagnosis Date   Hypertension    Shoulder fracture, right    Thyroid disease     History reviewed. No pertinent surgical history.  Social History:  reports that she has never smoked. She has never used smokeless tobacco. She reports that she does not drink alcohol and does not use drugs.  Allergies  Allergen Reactions   Lactose Nausea Only and Other (See Comments)    GI Upset- Upsets the stomach   Lactose Intolerance (Gi) Other (See Comments)    Upsets the stomach    No family history on file.   Prior to Admission medications   Medication Sig Start Date End Date Taking? Authorizing Provider  acetaminophen (TYLENOL) 325 MG tablet Take 2 tablets (650 mg total) by mouth every 6 (six) hours as needed for mild pain or headache (fever >/= 101). 03/02/20   Arrien, York Ram, MD  amLODipine (NORVASC) 10 MG tablet Take 1 tablet (10 mg total) by mouth daily. 01/25/21 04/25/21  Oswald Hillock, MD  apixaban (ELIQUIS) 2.5 MG TABS tablet Take 2.5 mg by mouth 2 (two) times daily.    [provider]  guaiFENesin-dextromethorphan (ROBITUSSIN DM) 100-10 MG/5ML syrup Take 5 mLs by mouth every 6 (six) hours as needed for cough. Patient not taking: No sig  reported 03/02/20   Arrien, Jimmy Picket, MD  levothyroxine (SYNTHROID) 100 MCG tablet Take 100 mcg by mouth daily before breakfast.    [provider]  losartan (COZAAR) 50 MG tablet Take 1 tablet (50 mg total) by mouth daily. 01/26/21 04/26/21  Oswald Hillock, MD  pravastatin (PRAVACHOL) 20 MG tablet Take 20 mg by mouth daily.    [provider]    Physical Exam: Vitals:   04/21/21 1745 04/21/21 1815 04/21/21 1915 04/21/21 1945  BP: (!) 122/35 (!) 146/108 (!) 177/71 92/69  Pulse: 79 79 67 66  Resp:  18 19 16   Temp:      TempSrc:      SpO2: 100% 100% 98% 100%  Weight:      Height:       Constitutional: Elderly woman resting supine in bed, NAD, calm, comfortable Eyes: PERRL, lids and conjunctivae normal ENMT: Mucous membranes are moist. Posterior pharynx clear of any exudate or lesions.edentulous. Neck: normal, supple, no masses. Respiratory: clear to auscultation bilaterally, no wheezing, no crackles. Normal respiratory effort. No accessory muscle use.  Cardiovascular: Regular rate and rhythm, no murmurs / rubs / gallops.  Trace lower extremity edema. 2+ pedal pulses. Abdomen: no tenderness, no masses palpated. No hepatosplenomegaly. Bowel sounds positive.  Musculoskeletal: no clubbing / cyanosis. No joint deformity upper and lower extremities. Good ROM, no contractures. Normal muscle tone.  No tenderness over spinous processes. Skin: no rashes, lesions, ulcers. No induration Neurologic: CN 2-12 grossly intact. Sensation intact. Strength 5/5 in all 4.  Psychiatric: Normal judgment and insight. Alert and oriented x 3. Normal mood.   Labs on Admission: I have personally reviewed following labs and imaging studies  CBC: Recent Labs  Lab 04/21/21 1257  WBC 13.9*  NEUTROABS 13.0*  HGB 9.8*  HCT 30.6*  MCV 93.3  PLT [id number]   Basic Metabolic Panel: Recent Labs  Lab 04/21/21 1257  NA 141  K 4.3  CL 109  CO2 23  GLUCOSE 124*  BUN 39*  CREATININE 2.02*   CALCIUM 8.7*   GFR: Estimated Creatinine Clearance: 13.7 mL/min (A) (by C-G formula based on SCr of 2.02 mg/dL (H)). Liver Function Tests: Recent Labs  Lab 04/21/21 1257  AST 17  ALT 11  ALKPHOS 45  BILITOT 0.6  PROT 6.7  ALBUMIN 3.7   No results for input(s): LIPASE, AMYLASE in the last 168 hours. No results for input(s): AMMONIA in the last 168 hours. Coagulation Profile: Recent Labs  Lab 04/21/21 1257  INR 1.0   Cardiac Enzymes: No results for input(s): CKTOTAL, CKMB, CKMBINDEX, TROPONINI in the last 168 hours. BNP (last 3 results) No results for input(s): PROBNP in the last 8760 hours. HbA1C: No results for input(s): HGBA1C in the last 72 hours. CBG: No results for input(s): GLUCAP in the last 168 hours. Lipid Profile: No results for input(s): CHOL, HDL, LDLCALC, TRIG, CHOLHDL, LDLDIRECT in the last 72 hours. Thyroid Function Tests: No results for input(s): TSH, T4TOTAL, FREET4, T3FREE, THYROIDAB in the last 72 hours. Anemia Panel: No results for input(s): VITAMINB12, FOLATE, FERRITIN, TIBC, IRON, RETICCTPCT  in the last 72 hours. Urine analysis:    Component Value Date/Time   COLORURINE YELLOW 01/23/2021 1309   APPEARANCEUR CLEAR 01/23/2021 1309   LABSPEC 1.015 01/23/2021 1309   PHURINE 7.0 01/23/2021 1309   GLUCOSEU NEGATIVE 01/23/2021 1309   HGBUR NEGATIVE 01/23/2021 1309   BILIRUBINUR NEGATIVE 01/23/2021 1309   KETONESUR NEGATIVE 01/23/2021 1309   PROTEINUR 100 (A) 01/23/2021 1309   NITRITE NEGATIVE 01/23/2021 1309   LEUKOCYTESUR NEGATIVE 01/23/2021 1309    Radiological Exams on Admission: CT ABDOMEN PELVIS WO CONTRAST  Result Date: 04/21/2021 CLINICAL DATA:  Rectal bleeding left lower quadrant pain. EXAM: CT ABDOMEN AND PELVIS WITHOUT CONTRAST TECHNIQUE: Multidetector CT imaging of the abdomen and pelvis was performed following the standard protocol without IV contrast. COMPARISON:  CT abdomen and pelvis 10/30/2015. FINDINGS: Lower chest: There is  some scarring in the left upper lobe. Hepatobiliary: No focal liver abnormality is seen. No gallstones, gallbladder wall thickening, or biliary dilatation. Pancreas: Unremarkable. No pancreatic ductal dilatation or surrounding inflammatory changes. Spleen: Normal in size without focal abnormality. Adrenals/Urinary Tract: There are numerous bilateral renal cysts, left greater than right. The largest cyst is in the left lower pole measuring 7.1 cm, mildly increased in size. There is no hydronephrosis. The adrenal glands and bladder are within normal limits. Stomach/Bowel: Stomach is within normal limits. Appendix appears normal. No evidence of bowel wall thickening, distention, or inflammatory changes. There is diffuse colonic diverticulosis without evidence for acute diverticulitis. Vascular/Lymphatic: Aortic atherosclerosis. No enlarged abdominal or pelvic lymph nodes. Reproductive: There are calcified uterine fibroids with the largest measuring 3.6 cm. These have mildly increased in size compared to the prior study. Adnexa are within normal limits. Other: No abdominal wall hernia or abnormality. No abdominopelvic ascites. Musculoskeletal: There is a severe compression fracture of L1 which is new from 2017. There is mild retropulsion of fracture fragment along the superior endplate. This is age indeterminate, but favored as subacute or chronic. IMPRESSION: 1. Diffuse colonic diverticulosis without evidence for diverticulitis. 2. Calcified uterine fibroids. 3. L1 compression fracture is new from 2017 and favored as crit subacute or chronic. Please correlate clinically. 4.  Aortic Atherosclerosis (ICD10-I70.0). Electronically Signed   By: Ronney Asters M.D.   On: 04/21/2021 18:27   CT Head Wo Contrast  Result Date: 04/21/2021 CLINICAL DATA:  Headache. EXAM: CT HEAD WITHOUT CONTRAST TECHNIQUE: Contiguous axial images were obtained from the base of the skull through the vertex without intravenous contrast.  COMPARISON:  CT head dated January 23, 2021. FINDINGS: Brain: No evidence of acute infarction, hemorrhage, hydrocephalus, extra-axial collection or mass lesion/mass effect. Stable atrophy and chronic microvascular ischemic changes. Vascular: Atherosclerotic vascular calcification of the carotid siphons. No hyperdense vessel. Skull: Normal. Negative for fracture or focal lesion. Sinuses/Orbits: No acute finding. Other: None. IMPRESSION: 1. No acute intracranial abnormality. 2. Stable atrophy and chronic microvascular ischemic changes. Electronically Signed   By: Titus Dubin M.D.   On: 04/21/2021 18:20    EKG: Not performed.  Assessment/Plan Principal Problem:   Acute GI bleeding Active Problems:   Acute kidney injury superimposed on CKD ([place])   History of deep vein thrombosis (DVT) of lower extremity   Essential hypertension   Hypothyroidism   Hyperlipidemia   Compression fracture of L1 vertebra ([facility])   Stacy Huang is a 87 y.o. female with medical history significant for bilateral lower extremity DVT (02/2020) on Eliquis, CKD stage IIIb, HTN, HLD, and hypothyroidism who is admitted with acute GI bleed.  Acute GI  bleed with blood loss anemia: Most likely diverticular bleed, has history of similar in 2017.  Appears that she is still on anticoagulation with low-dose Eliquis.  Hemoglobin 9.8, down from baseline around 12.  She has not had any further bleeding so far while in the ED. -Repeat H&H now and recheck CBC in a.m. -Transfuse PRBC if hemoglobin <7.0; discussed with patient, she is agreeable for transfusion if needed -Clear liquid diet now, n.p.o. after midnight -Hold Eliquis and other blood thinners  Acute kidney injury superimposed on CKD stage IIIb: Creatinine 2.02 on admission compared to baseline 1.5-1.6.  Likely related to hypoperfusion in setting of blood loss anemia. -Started on IV NS@100  mL/hour overnight -Follow H&H as above and transfuse PRBC if needed  History of  bilateral lower extremity DVT (02/2020): Has been on low-dose Eliquis 2.5 mg twice daily since September 2021.  Eliquis currently on hold due to GI bleeding.  Hypertension: Holding amlodipine as BP is on the softer side at time of admission.  Hypothyroidism: Continue Synthroid.  Hyperlipidemia: Continue pravastatin.  L1 vertebral compression fracture: Severe L1 vertebral compression fracture seen on CT A/P, new finding when compared to imaging from 2017.  Age is indeterminate but felt to be subacute or chronic per radiology read.  Patient does not complaining of any back pain and has no tenderness over the spinous processes.  DVT prophylaxis: SCDs Code Status: DNR, confirmed with patient on admission Family Communication: Discussed with patient, she has discussed with family Disposition Plan: From home, dispo pending clinical progress Consults called: EDP discussed with on-call GI Level of care: Med-Surg Admission status:  Status is: Observation  The patient remains OBS appropriate and will d/c before 2 midnights.   Zada Finders MD [facility]  If 7PM-7AM, please contact night-coverage [email or web address]  04/21/2021, 8:06 PM

## 2021-04-22 ENCOUNTER — Encounter (HOSPITAL_COMMUNITY): Payer: Self-pay | Admitting: Internal Medicine

## 2021-04-22 DIAGNOSIS — D62 Acute posthemorrhagic anemia: Secondary | ICD-10-CM | POA: Diagnosis present

## 2021-04-22 DIAGNOSIS — N179 Acute kidney failure, unspecified: Secondary | ICD-10-CM

## 2021-04-22 DIAGNOSIS — D72828 Other elevated white blood cell count: Secondary | ICD-10-CM | POA: Diagnosis present

## 2021-04-22 DIAGNOSIS — Z86718 Personal history of other venous thrombosis and embolism: Secondary | ICD-10-CM

## 2021-04-22 DIAGNOSIS — N1832 Chronic kidney disease, stage 3b: Secondary | ICD-10-CM | POA: Diagnosis present

## 2021-04-22 DIAGNOSIS — N189 Chronic kidney disease, unspecified: Secondary | ICD-10-CM

## 2021-04-22 DIAGNOSIS — Z7901 Long term (current) use of anticoagulants: Secondary | ICD-10-CM | POA: Diagnosis not present

## 2021-04-22 DIAGNOSIS — E039 Hypothyroidism, unspecified: Secondary | ICD-10-CM | POA: Diagnosis present

## 2021-04-22 DIAGNOSIS — Z8616 Personal history of COVID-19: Secondary | ICD-10-CM | POA: Diagnosis not present

## 2021-04-22 DIAGNOSIS — F05 Delirium due to known physiological condition: Secondary | ICD-10-CM | POA: Diagnosis present

## 2021-04-22 DIAGNOSIS — Z79890 Hormone replacement therapy (postmenopausal): Secondary | ICD-10-CM | POA: Diagnosis not present

## 2021-04-22 DIAGNOSIS — Z66 Do not resuscitate: Secondary | ICD-10-CM | POA: Diagnosis present

## 2021-04-22 DIAGNOSIS — I10 Essential (primary) hypertension: Secondary | ICD-10-CM | POA: Diagnosis not present

## 2021-04-22 DIAGNOSIS — K922 Gastrointestinal hemorrhage, unspecified: Secondary | ICD-10-CM | POA: Diagnosis present

## 2021-04-22 DIAGNOSIS — I129 Hypertensive chronic kidney disease with stage 1 through stage 4 chronic kidney disease, or unspecified chronic kidney disease: Secondary | ICD-10-CM | POA: Diagnosis present

## 2021-04-22 DIAGNOSIS — G9341 Metabolic encephalopathy: Secondary | ICD-10-CM | POA: Diagnosis present

## 2021-04-22 DIAGNOSIS — K5731 Diverticulosis of large intestine without perforation or abscess with bleeding: Secondary | ICD-10-CM | POA: Diagnosis present

## 2021-04-22 DIAGNOSIS — Z79899 Other long term (current) drug therapy: Secondary | ICD-10-CM | POA: Diagnosis not present

## 2021-04-22 DIAGNOSIS — E785 Hyperlipidemia, unspecified: Secondary | ICD-10-CM | POA: Diagnosis present

## 2021-04-22 LAB — BASIC METABOLIC PANEL
Anion gap: 8 (ref 5–15)
BUN: 38 mg/dL — ABNORMAL HIGH (ref 8–23)
CO2: 26 mmol/L (ref 22–32)
Calcium: 8.9 mg/dL (ref 8.9–10.3)
Chloride: 107 mmol/L (ref 98–111)
Creatinine, Ser: 1.83 mg/dL — ABNORMAL HIGH (ref 0.44–1.00)
GFR, Estimated: 25 mL/min — ABNORMAL LOW (ref >60)
Glucose, Bld: 102 mg/dL — ABNORMAL HIGH (ref 70–99)
Potassium: 3.6 mmol/L (ref 3.5–5.1)
Sodium: 141 mmol/L (ref 135–145)

## 2021-04-22 LAB — PROTIME-INR
INR: 1 (ref 0.8–1.2)
Prothrombin Time: 13.4 seconds (ref 11.4–15.2)

## 2021-04-22 LAB — CBC
HCT: 27.2 % — ABNORMAL LOW (ref 36.0–46.0)
Hemoglobin: 8.6 g/dL — ABNORMAL LOW (ref 12.0–15.0)
MCH: 30.2 pg (ref 26.0–34.0)
MCHC: 31.6 g/dL (ref 30.0–36.0)
MCV: 95.4 fL (ref 80.0–100.0)
Platelets: 245 10*3/uL (ref 150–400)
RBC: 2.85 MIL/uL — ABNORMAL LOW (ref 3.87–5.11)
RDW: 13.2 % (ref 11.5–15.5)
WBC: 10.6 10*3/uL — ABNORMAL HIGH (ref 4.0–10.5)
nRBC: 0 % (ref 0.0–0.2)

## 2021-04-22 MED ORDER — SODIUM CHLORIDE 0.9 % IV SOLN: INTRAVENOUS | Status: DC

## 2021-04-22 MED ORDER — HYDRALAZINE HCL 20 MG/ML IJ SOLN
5.0000 mg | Freq: Three times a day (TID) | INTRAMUSCULAR | Status: DC | PRN
  Administered 2021-04-22: 5 mg via INTRAVENOUS
  Filled 2021-04-22: qty 1

## 2021-04-22 MED ORDER — AMLODIPINE BESYLATE 10 MG PO TABS
10.0000 mg | ORAL_TABLET | Freq: Every day | ORAL | Status: DC
  Administered 2021-04-22 – 2021-04-25 (×4): 10 mg via ORAL
  Filled 2021-04-22 (×4): qty 1

## 2021-04-22 MED ORDER — HYDRALAZINE HCL 20 MG/ML IJ SOLN: 10.0000 mg | Freq: Three times a day (TID) | INTRAMUSCULAR | Status: DC | PRN

## 2021-04-22 MED ORDER — HYDRALAZINE HCL 20 MG/ML IJ SOLN
5.0000 mg | Freq: Once | INTRAMUSCULAR | Status: AC
  Administered 2021-04-22: 5 mg via INTRAVENOUS
  Filled 2021-04-22: qty 1

## 2021-04-22 NOTE — Consult Note (Signed)
Reason for Consult: GI blood loss almost certainly diverticuli Referring Physician: Hospital team  Stacy Huang is an 87 y.o. female.  HPI: Patient seen and examined in hospital computer chart reviewed and case discussed with the ER physician and she has not had any bowel movements today and is on a blood thinner at home but does not have any pain and her previous bout of bleeding in [place] was reviewed on the computer and an endoscopy at that time was negative and she does have diverticuli on her CT scan and she is not on any extra aspirin or nonsteroidals and has no other complaints and is currently thirsty  Past Medical History:  Diagnosis Date   Hypertension    Shoulder fracture, right    Thyroid disease     History reviewed. No pertinent surgical history.  History reviewed. No pertinent family history.  Social History:  reports that she has never smoked. She has never used smokeless tobacco. She reports that she does not drink alcohol and does not use drugs.  Allergies:  Allergies  Allergen Reactions   Lactose Nausea Only and Other (See Comments)    GI Upset- Upsets the stomach   Lactose Intolerance (Gi) Other (See Comments)    Upsets the stomach    Medications: I have reviewed the patient's current medications.  Results for orders placed or performed during the hospital encounter of 04/21/21 (from the past 48 hour(s))  CBC with Differential/Platelet     Status: Abnormal   Collection Time: 04/21/21 12:57 PM  Result Value Ref Range   WBC 13.9 (H) 4.0 - 10.5 K/uL   RBC 3.28 (L) 3.87 - 5.11 MIL/uL   Hemoglobin 9.8 (L) 12.0 - 15.0 g/dL   HCT 30.6 (L) 36.0 - 46.0 %   MCV 93.3 80.0 - 100.0 fL   MCH 29.9 26.0 - 34.0 pg   MCHC 32.0 30.0 - 36.0 g/dL   RDW 13.2 11.5 - 15.5 %   Platelets 307 150 - 400 K/uL   nRBC 0.0 0.0 - 0.2 %   Neutrophils Relative % 94 %   Neutro Abs 13.0 (H) 1.7 - 7.7 K/uL   Lymphocytes Relative 4 %   Lymphs Abs 0.6 (L) 0.7 - 4.0 K/uL   Monocytes  Relative 1 %   Monocytes Absolute 0.2 0.1 - 1.0 K/uL   Eosinophils Relative 0 %   Eosinophils Absolute 0.0 0.0 - 0.5 K/uL   Basophils Relative 0 %   Basophils Absolute 0.1 0.0 - 0.1 K/uL   Immature Granulocytes 1 %   Abs Immature Granulocytes 0.07 0.00 - 0.07 K/uL    Comment: Performed at [facility], [place] [address], [place], [place] [address]  Comprehensive metabolic panel     Status: Abnormal   Collection Time: 04/21/21 12:57 PM  Result Value Ref Range   Sodium 141 135 - 145 mmol/L   Potassium 4.3 3.5 - 5.1 mmol/L   Chloride 109 98 - 111 mmol/L   CO2 23 22 - 32 mmol/L   Glucose, Bld 124 (H) 70 - 99 mg/dL    Comment: Glucose reference range applies only to samples taken after fasting for at least 8 hours.   BUN 39 (H) 8 - 23 mg/dL   Creatinine, Ser 2.02 (H) 0.44 - 1.00 mg/dL   Calcium 8.7 (L) 8.9 - 10.3 mg/dL   Total Protein 6.7 6.5 - 8.1 g/dL   Albumin 3.7 3.5 - 5.0 g/dL   AST 17 15 - 41  U/L   ALT 11 0 - 44 U/L   Alkaline Phosphatase 45 38 - 126 U/L   Total Bilirubin 0.6 0.3 - 1.2 mg/dL   GFR, Estimated 22 (L) >60 mL/min    Comment: (NOTE) Calculated using the CKD-EPI Creatinine Equation (2021)    Anion gap 9 5 - 15    Comment: Performed at [facility], [place] [address], [place], [place] [address]  Protime-INR     Status: None   Collection Time: 04/21/21 12:57 PM  Result Value Ref Range   Prothrombin Time 12.9 11.4 - 15.2 seconds   INR 1.0 0.8 - 1.2    Comment: (NOTE) INR goal varies based on device and disease states. Performed at [facility], [place] [address] [place], [place] [address]   Type and screen [place]     Status: None   Collection Time: 04/21/21 12:59 PM  Result Value Ref Range   ABO/RH(D) B POS    Antibody Screen NEG    Sample Expiration      04/24/2021,2359 Performed at [facility] [place] [address], [place], [place] [address]   POC occult blood, ED      Status: Abnormal   Collection Time: 04/21/21  6:14 PM  Result Value Ref Range   Fecal Occult Bld POSITIVE (A) NEGATIVE  ABO/Rh     Status: None   Collection Time: 04/21/21  7:34 PM  Result Value Ref Range   ABO/RH(D)      B POS Performed at [facility], [place] [address] [place], [place] [address]   Hemoglobin and hematocrit, blood     Status: Abnormal   Collection Time: 04/21/21  7:34 PM  Result Value Ref Range   Hemoglobin 8.9 (L) 12.0 - 15.0 g/dL   HCT 27.8 (L) 36.0 - 46.0 %    Comment: Performed at [facility], [place] [address], [place], [place] [address]  Resp Panel by RT-PCR (Flu A&B, Covid) Nasopharyngeal Swab     Status: None   Collection Time: 04/21/21  7:43 PM   Specimen: Nasopharyngeal Swab; Nasopharyngeal(NP) swabs in vial transport medium  Result Value Ref Range   SARS Coronavirus 2 by RT PCR NEGATIVE NEGATIVE    Comment: (NOTE) SARS-CoV-2 target nucleic acids are NOT DETECTED.  The SARS-CoV-2 RNA is generally detectable in upper respiratory specimens during the acute phase of infection. The lowest concentration of SARS-CoV-2 viral copies this assay can detect is 138 copies/mL. A negative result does not preclude SARS-Cov-2 infection and should not be used as the sole basis for treatment or other patient management decisions. A negative result may occur with  improper specimen collection/handling, submission of specimen other than nasopharyngeal swab, presence of viral mutation(s) within the areas targeted by this assay, and inadequate number of viral copies(<138 copies/mL). A negative result must be combined with clinical observations, patient history, and epidemiological information. The expected result is Negative.  Fact Sheet for Patients:  EntrepreneurPulse.com.au  Fact Sheet for Healthcare Providers:  IncredibleEmployment.be  This test is no t yet approved or cleared by the [place] FDA and  has been authorized for detection and/or diagnosis of SARS-CoV-2 by FDA under an Emergency Use Authorization (EUA). This EUA will remain  in effect (meaning this test can be used) for the duration of the COVID-19 declaration under Section 564(b)(1) of the Act, 21 U.S.C.section 360bbb-3(b)(1), unless the authorization is terminated  or revoked sooner.  Influenza A by PCR NEGATIVE NEGATIVE   Influenza B by PCR NEGATIVE NEGATIVE    Comment: (NOTE) The Xpert Xpress SARS-CoV-2/FLU/RSV plus assay is intended as an aid in the diagnosis of influenza from Nasopharyngeal swab specimens and should not be used as a sole basis for treatment. Nasal washings and aspirates are unacceptable for Xpert Xpress SARS-CoV-2/FLU/RSV testing.  Fact Sheet for Patients: BloggerCourse.com  Fact Sheet for Healthcare Providers: SeriousBroker.it  This test is not yet approved or cleared by the [place] FDA and has been authorized for detection and/or diagnosis of SARS-CoV-2 by FDA under an Emergency Use Authorization (EUA). This EUA will remain in effect (meaning this test can be used) for the duration of the COVID-19 declaration under Section 564(b)(1) of the Act, 21 U.S.C. section 360bbb-3(b)(1), unless the authorization is terminated or revoked.  Performed at [facility], [address] [place], [place] [address]   CBC     Status: Abnormal   Collection Time: 04/22/21  6:14 AM  Result Value Ref Range   WBC 10.6 (H) 4.0 - 10.5 K/uL   RBC 2.85 (L) 3.87 - 5.11 MIL/uL   Hemoglobin 8.6 (L) 12.0 - 15.0 g/dL   HCT 08.6 (L) 57.8 - 46.9 %   MCV 95.4 80.0 - 100.0 fL   MCH 30.2 26.0 - 34.0 pg   MCHC 31.6 30.0 - 36.0 g/dL   RDW 62.9 52.8 - 41.3 %   Platelets 245 150 - 400 K/uL   nRBC 0.0 0.0 - 0.2 %    Comment: Performed at [facility], [address], [place], [place] [address]  Basic  metabolic panel     Status: Abnormal   Collection Time: 04/22/21  6:14 AM  Result Value Ref Range   Sodium 141 135 - 145 mmol/L   Potassium 3.6 3.5 - 5.1 mmol/L   Chloride 107 98 - 111 mmol/L   CO2 26 22 - 32 mmol/L   Glucose, Bld 102 (H) 70 - 99 mg/dL    Comment: Glucose reference range applies only to samples taken after fasting for at least 8 hours.   BUN 38 (H) 8 - 23 mg/dL   Creatinine, Ser 0.27 (H) 0.44 - 1.00 mg/dL   Calcium 8.9 8.9 - 25.3 mg/dL   GFR, Estimated 25 (L) >60 mL/min    Comment: (NOTE) Calculated using the CKD-EPI Creatinine Equation (2021)    Anion gap 8 5 - 15    Comment: Performed at [facility], [address], [place], [place] [address]  Protime-INR     Status: None   Collection Time: 04/22/21  6:14 AM  Result Value Ref Range   Prothrombin Time 13.4 11.4 - 15.2 seconds   INR 1.0 0.8 - 1.2    Comment: (NOTE) INR goal varies based on device and disease states. Performed at [facility], [address] [place], [place] [address]     CT ABDOMEN PELVIS WO CONTRAST  Result Date: 04/21/2021 CLINICAL DATA:  Rectal bleeding left lower quadrant pain. EXAM: CT ABDOMEN AND PELVIS WITHOUT CONTRAST TECHNIQUE: Multidetector CT imaging of the abdomen and pelvis was performed following the standard protocol without IV contrast. COMPARISON:  CT abdomen and pelvis 10/30/2015. FINDINGS: Lower chest: There is some scarring in the left upper lobe. Hepatobiliary: No focal liver abnormality is seen. No gallstones, gallbladder wall thickening, or biliary dilatation. Pancreas: Unremarkable. No pancreatic ductal dilatation or surrounding inflammatory changes. Spleen: Normal in size without focal abnormality. Adrenals/Urinary  Tract: There are numerous bilateral renal cysts, left greater than right. The largest cyst is in the left lower pole measuring 7.1 cm, mildly increased in size. There is no hydronephrosis. The adrenal glands and bladder are  within normal limits. Stomach/Bowel: Stomach is within normal limits. Appendix appears normal. No evidence of bowel wall thickening, distention, or inflammatory changes. There is diffuse colonic diverticulosis without evidence for acute diverticulitis. Vascular/Lymphatic: Aortic atherosclerosis. No enlarged abdominal or pelvic lymph nodes. Reproductive: There are calcified uterine fibroids with the largest measuring 3.6 cm. These have mildly increased in size compared to the prior study. Adnexa are within normal limits. Other: No abdominal wall hernia or abnormality. No abdominopelvic ascites. Musculoskeletal: There is a severe compression fracture of L1 which is new from 2017. There is mild retropulsion of fracture fragment along the superior endplate. This is age indeterminate, but favored as subacute or chronic. IMPRESSION: 1. Diffuse colonic diverticulosis without evidence for diverticulitis. 2. Calcified uterine fibroids. 3. L1 compression fracture is new from 2017 and favored as crit subacute or chronic. Please correlate clinically. 4.  Aortic Atherosclerosis (ICD10-I70.0). Electronically Signed   By: Ronney Asters M.D.   On: 04/21/2021 18:27   CT Head Wo Contrast  Result Date: 04/21/2021 CLINICAL DATA:  Headache. EXAM: CT HEAD WITHOUT CONTRAST TECHNIQUE: Contiguous axial images were obtained from the base of the skull through the vertex without intravenous contrast. COMPARISON:  CT head dated January 23, 2021. FINDINGS: Brain: No evidence of acute infarction, hemorrhage, hydrocephalus, extra-axial collection or mass lesion/mass effect. Stable atrophy and chronic microvascular ischemic changes. Vascular: Atherosclerotic vascular calcification of the carotid siphons. No hyperdense vessel. Skull: Normal. Negative for fracture or focal lesion. Sinuses/Orbits: No acute finding. Other: None. IMPRESSION: 1. No acute intracranial abnormality. 2. Stable atrophy and chronic microvascular ischemic changes.  Electronically Signed   By: Titus Dubin M.D.   On: 04/21/2021 18:20    ROS negative except above she lives at home with her son Blood pressure (!) 219/79, pulse (!) 55, temperature (!) 97.4 F (36.3 C), temperature source Oral, resp. rate 18, height 5\' 2"  (1.575 m), weight 64 kg, SpO2 100 %. Physical Exam elderly pleasant no acute distress in good spirits exam pertinent for her abdomen being soft nontender hemoglobin slight decrease creatinine slight decrease as well CT okay except for diverticuli  Assessment/Plan: Almost certainly diverticular bleeding in a patient on blood thinner Plan: Agree with holding blood thinner and will allow clear liquids and reassess blood thinner needs at home and if bleeding increases would transfuse and consider nuclear bleeding scan and possibly IR consult if he continues and will check on tomorrow  [facility] E 04/22/2021, 10:51 AM

## 2021-04-22 NOTE — Progress Notes (Signed)
PROGRESS NOTE  Stacy Huang [id number] DOB: [birth year] DOA: 04/21/2021 PCP: Egbert Garibaldi, PA-C  HPI/Recap of past 24 hours: Stacy Huang is a 87 y.o. female with medical history significant for bilateral lower extremity DVT (02/2020) on Eliquis, CKD stage IIIb, HTN, HLD, and hypothyroidism who presented to the ED for evaluation of rectal bleeding 1 day PTA. Reported 4-5 episodes of dark red blood per rectum with associated mild left-sided abdominal pain. Pt reports a hx of prior rectal bleeding in May 2017 which was assumed to be possible diverticular bleed at [facility]. EGD at that time was unremarkable, patient/family declined colonoscopy. Of note, pt was started on low-dose Eliquis 2.5 mg twice daily in September 2021 when she was found to have bilateral lower extremity DVTs while in the hospital with COVID-19 pneumonia.  It appears this was planned for a 29-month course however it seems patient remained on Eliquis. In the ED, VSS stable other than uncontrolled BP. Labs showed hemoglobin 9.8 (previously 12.5 on 01/24/2021), WBC 13.9, BUN 39, creatinine 2.02 (baseline 1.5-1.6). FOBT is positive. CT abdomen/pelvis without contrast shows diffuse colonic diverticulosis without evidence for diverticulitis. EDP discussed with on-call GI who recommended [facility] admission. Pt admitted for further management    Today, since admitted, pt hasnt had any further rectal bleeding, still with some mild L-sided abdominal cramping. Denies any fever/chills, chest pain, SOB. BP noted to be uncontrolled.    Assessment/Plan: Principal Problem:   Acute GI bleeding Active Problems:   Acute kidney injury superimposed on CKD (HCC)   History of deep vein thrombosis (DVT) of lower extremity   Essential hypertension   Hypothyroidism   Hyperlipidemia   Compression fracture of L1 vertebra (HCC)   Possible acute lower GI bleed- ?possibly diverticular bleed on Eliquis Acute blood loss anemia Baseline  hgb ~ 12, -->9.8 in admission FOBT positive CT abdomen/pelvis without contrast shows diffuse colonic diverticulosis without evidence for diverticulitis. Transfuse PRBC if hemoglobin <7.0 or recurrent bleeding GI consulted, rec CLD, consider nuclear bleeding scan and IR consultation if recurrent bleeding occurs Continue to hold Eliquis Monitor CBC   Acute kidney injury superimposed on CKD stage IIIb Creatinine 2.02 on admission, baseline 1.5-1.6 Likely 2/2 above S/P IVF (held due to uncontrolled BP, encouraged oral drinking)   History of bilateral lower extremity DVT (02/2020) Has been on low-dose Eliquis 2.5 mg twice daily since September 2021 Will d/c Eliquis upon discharge   Hypertension Restart amlodipine IV hydralazine prn   Hypothyroidism Continue Synthroid  Hyperlipidemia Continue pravastatin   L1 vertebral compression fracture Severe L1 vertebral compression fracture seen on CT A/P, new finding when compared to imaging from 2017 Denies any back pain Felt to be subacute or chronic per radiology read      Estimated body mass index is 25.81 kg/m as calculated from the following:   Height as of this encounter: 5\' 2"  (1.575 m).   Weight as of this encounter: 64 kg.     Code Status: DNR  Family Communication: None at bedside  Disposition Plan: Status is: Observation  The patient remains OBS appropriate and will d/c before 2 midnights.     Consultants: GI  Procedures: None  Antimicrobials: None  DVT prophylaxis: SCDs   Objective: Vitals:   04/22/21 0913 04/22/21 1120 04/22/21 1227 04/22/21 1325  BP: (!) 219/79 (!) 229/63 (!) 175/57 (!) 179/46  Pulse: (!) 55 63 67 69  Resp: 18 19  17   Temp: (!) 97.4 F (36.3 C) (!) 97.5 F (  36.4 C)  (!) 97.5 F (36.4 C)  TempSrc: Oral     SpO2: 100% 100%  99%  Weight:      Height:        Intake/Output Summary (Last 24 hours) at 04/22/2021 1358 Last data filed at 04/22/2021 0500 Gross per 24 hour   Intake 893.33 ml  Output --  Net 893.33 ml   Filed Weights   04/21/21 1228  Weight: 64 kg    Exam: General: NAD, elderly, oriented Cardiovascular: S1, S2 present Respiratory: CTAB Abdomen: Soft, mildly tender, nondistended, bowel sounds present Musculoskeletal: No bilateral pedal edema noted Skin: Normal Psychiatry: Normal mood     Data Reviewed: CBC: Recent Labs  Lab 04/21/21 1257 04/21/21 1934 04/22/21 0614  WBC 13.9*  --  10.6*  NEUTROABS 13.0*  --   --   HGB 9.8* 8.9* 8.6*  HCT 30.6* 27.8* 27.2*  MCV 93.3  --  95.4  PLT 307  --  [id number]   Basic Metabolic Panel: Recent Labs  Lab 04/21/21 1257 04/22/21 0614  NA 141 141  K 4.3 3.6  CL 109 107  CO2 23 26  GLUCOSE 124* 102*  BUN 39* 38*  CREATININE 2.02* 1.83*  CALCIUM 8.7* 8.9   GFR: Estimated Creatinine Clearance: 15.1 mL/min (A) (by C-G formula based on SCr of 1.83 mg/dL (H)). Liver Function Tests: Recent Labs  Lab 04/21/21 1257  AST 17  ALT 11  ALKPHOS 45  BILITOT 0.6  PROT 6.7  ALBUMIN 3.7   No results for input(s): LIPASE, AMYLASE in the last 168 hours. No results for input(s): AMMONIA in the last 168 hours. Coagulation Profile: Recent Labs  Lab 04/21/21 1257 04/22/21 0614  INR 1.0 1.0   Cardiac Enzymes: No results for input(s): CKTOTAL, CKMB, CKMBINDEX, TROPONINI in the last 168 hours. BNP (last 3 results) No results for input(s): PROBNP in the last 8760 hours. HbA1C: No results for input(s): HGBA1C in the last 72 hours. CBG: No results for input(s): GLUCAP in the last 168 hours. Lipid Profile: No results for input(s): CHOL, HDL, LDLCALC, TRIG, CHOLHDL, LDLDIRECT in the last 72 hours. Thyroid Function Tests: No results for input(s): TSH, T4TOTAL, FREET4, T3FREE, THYROIDAB in the last 72 hours. Anemia Panel: No results for input(s): VITAMINB12, FOLATE, FERRITIN, TIBC, IRON, RETICCTPCT in the last 72 hours. Urine analysis:    Component Value Date/Time   COLORURINE YELLOW  01/23/2021 1309   APPEARANCEUR CLEAR 01/23/2021 1309   LABSPEC 1.015 01/23/2021 1309   PHURINE 7.0 01/23/2021 1309   GLUCOSEU NEGATIVE 01/23/2021 1309   HGBUR NEGATIVE 01/23/2021 1309   BILIRUBINUR NEGATIVE 01/23/2021 1309   KETONESUR NEGATIVE 01/23/2021 1309   PROTEINUR 100 (A) 01/23/2021 1309   NITRITE NEGATIVE 01/23/2021 1309   LEUKOCYTESUR NEGATIVE 01/23/2021 1309   Sepsis Labs: @LABRCNTIP (procalcitonin:4,lacticidven:4)  ) Recent Results (from the past 240 hour(s))  Resp Panel by RT-PCR (Flu A&B, Covid) Nasopharyngeal Swab     Status: None   Collection Time: 04/21/21  7:43 PM   Specimen: Nasopharyngeal Swab; Nasopharyngeal(NP) swabs in vial transport medium  Result Value Ref Range Status   SARS Coronavirus 2 by RT PCR NEGATIVE NEGATIVE Final    Comment: (NOTE) SARS-CoV-2 target nucleic acids are NOT DETECTED.  The SARS-CoV-2 RNA is generally detectable in upper respiratory specimens during the acute phase of infection. The lowest concentration of SARS-CoV-2 viral copies this assay can detect is 138 copies/mL. A negative result does not preclude SARS-Cov-2 infection and should not be used as the sole  basis for treatment or other patient management decisions. A negative result may occur with  improper specimen collection/handling, submission of specimen other than nasopharyngeal swab, presence of viral mutation(s) within the areas targeted by this assay, and inadequate number of viral copies(<138 copies/mL). A negative result must be combined with clinical observations, patient history, and epidemiological information. The expected result is Negative.  Fact Sheet for Patients:  BloggerCourse.com  Fact Sheet for Healthcare Providers:  SeriousBroker.it  This test is no t yet approved or cleared by the [place] FDA and  has been authorized for detection and/or diagnosis of SARS-CoV-2 by FDA under an Emergency Use  Authorization (EUA). This EUA will remain  in effect (meaning this test can be used) for the duration of the COVID-19 declaration under Section 564(b)(1) of the Act, 21 U.S.C.section 360bbb-3(b)(1), unless the authorization is terminated  or revoked sooner.       Influenza A by PCR NEGATIVE NEGATIVE Final   Influenza B by PCR NEGATIVE NEGATIVE Final    Comment: (NOTE) The Xpert Xpress SARS-CoV-2/FLU/RSV plus assay is intended as an aid in the diagnosis of influenza from Nasopharyngeal swab specimens and should not be used as a sole basis for treatment. Nasal washings and aspirates are unacceptable for Xpert Xpress SARS-CoV-2/FLU/RSV testing.  Fact Sheet for Patients: BloggerCourse.com  Fact Sheet for Healthcare Providers: SeriousBroker.it  This test is not yet approved or cleared by the [place] FDA and has been authorized for detection and/or diagnosis of SARS-CoV-2 by FDA under an Emergency Use Authorization (EUA). This EUA will remain in effect (meaning this test can be used) for the duration of the COVID-19 declaration under Section 564(b)(1) of the Act, 21 U.S.C. section 360bbb-3(b)(1), unless the authorization is terminated or revoked.  Performed at [facility], [address] [place], [place] [address]       Studies: CT ABDOMEN PELVIS WO CONTRAST  Result Date: 04/21/2021 CLINICAL DATA:  Rectal bleeding left lower quadrant pain. EXAM: CT ABDOMEN AND PELVIS WITHOUT CONTRAST TECHNIQUE: Multidetector CT imaging of the abdomen and pelvis was performed following the standard protocol without IV contrast. COMPARISON:  CT abdomen and pelvis 10/30/2015. FINDINGS: Lower chest: There is some scarring in the left upper lobe. Hepatobiliary: No focal liver abnormality is seen. No gallstones, gallbladder wall thickening, or biliary dilatation. Pancreas: Unremarkable. No pancreatic ductal dilatation or  surrounding inflammatory changes. Spleen: Normal in size without focal abnormality. Adrenals/Urinary Tract: There are numerous bilateral renal cysts, left greater than right. The largest cyst is in the left lower pole measuring 7.1 cm, mildly increased in size. There is no hydronephrosis. The adrenal glands and bladder are within normal limits. Stomach/Bowel: Stomach is within normal limits. Appendix appears normal. No evidence of bowel wall thickening, distention, or inflammatory changes. There is diffuse colonic diverticulosis without evidence for acute diverticulitis. Vascular/Lymphatic: Aortic atherosclerosis. No enlarged abdominal or pelvic lymph nodes. Reproductive: There are calcified uterine fibroids with the largest measuring 3.6 cm. These have mildly increased in size compared to the prior study. Adnexa are within normal limits. Other: No abdominal wall hernia or abnormality. No abdominopelvic ascites. Musculoskeletal: There is a severe compression fracture of L1 which is new from 2017. There is mild retropulsion of fracture fragment along the superior endplate. This is age indeterminate, but favored as subacute or chronic. IMPRESSION: 1. Diffuse colonic diverticulosis without evidence for diverticulitis. 2. Calcified uterine fibroids. 3. L1 compression fracture is new from 2017 and favored as crit subacute or chronic. Please correlate clinically.  4.  Aortic Atherosclerosis (ICD10-I70.0). Electronically Signed   By: Ronney Asters M.D.   On: 04/21/2021 18:27   CT Head Wo Contrast  Result Date: 04/21/2021 CLINICAL DATA:  Headache. EXAM: CT HEAD WITHOUT CONTRAST TECHNIQUE: Contiguous axial images were obtained from the base of the skull through the vertex without intravenous contrast. COMPARISON:  CT head dated January 23, 2021. FINDINGS: Brain: No evidence of acute infarction, hemorrhage, hydrocephalus, extra-axial collection or mass lesion/mass effect. Stable atrophy and chronic microvascular ischemic  changes. Vascular: Atherosclerotic vascular calcification of the carotid siphons. No hyperdense vessel. Skull: Normal. Negative for fracture or focal lesion. Sinuses/Orbits: No acute finding. Other: None. IMPRESSION: 1. No acute intracranial abnormality. 2. Stable atrophy and chronic microvascular ischemic changes. Electronically Signed   By: Titus Dubin M.D.   On: 04/21/2021 18:20    Scheduled Meds:  amLODipine  10 mg Oral Daily   [START ON 04/23/2021] levothyroxine  100 mcg Oral Q0600   pravastatin  20 mg Oral q1800    Continuous Infusions:   LOS: 0 days     Alma Friendly, MD [facility]  If 7PM-7AM, please contact night-coverage [email or web address] 04/22/2021, 1:58 PM

## 2021-04-22 NOTE — Progress Notes (Signed)
   04/22/21 0913  Assess: MEWS Score  Temp (!) 97.4 F (36.3 C)  BP (!) 219/79  Pulse Rate (!) 55  Resp 18  SpO2 100 %  Assess: MEWS Score  MEWS Temp 0  MEWS Systolic 2  MEWS Pulse 0  MEWS RR 0  MEWS LOC 0  MEWS Score 2  MEWS Score Color Yellow  Assess: if the MEWS score is Yellow or Red  Were vital signs taken at a resting state? Yes  Focused Assessment No change from prior assessment  Does the patient meet 2 or more of the SIRS criteria? No  MEWS guidelines implemented *See Row Information* Yes  Treat  MEWS Interventions Administered prn meds/treatments  Pain Scale 0-10  Pain Score 0  Take Vital Signs  Increase Vital Sign Frequency  Yellow: Q 2hr X 2 then Q 4hr X 2, if remains yellow, continue Q 4hrs  Escalate  MEWS: Escalate Yellow: discuss with charge nurse/RN and consider discussing with provider and RRT  Notify: Charge Nurse/RN  Name of Charge Nurse/RN Notified Toniann Fail RN  Date Charge Nurse/RN Notified 04/22/21  Time Charge Nurse/RN Notified 0941  Notify: Provider  Provider Name/Title Dr. Sharolyn Douglas  Date Provider Notified 04/22/21  Time Provider Notified [phone]  Notification Type Page  Notification Reason Change in status  Provider response At bedside;Other (Comment) (Hold IV fluids, give PRN hydralazine)  Date of Provider Response 04/22/21  Time of Provider Response [phone]  Notify: Rapid Response  Name of Rapid Response RN Notified  (Not called)  Document  Patient Outcome Other (Comment) (Monitoring status)  Progress note created (see row info) Yes  Assess: SIRS CRITERIA  SIRS Temperature  0  SIRS Pulse 0  SIRS Respirations  0  SIRS WBC 0  SIRS Score Sum  0  Patient was just washed up, made comfortable, will continue to monitor patient and initiated yellow mews.

## 2021-04-22 NOTE — Progress Notes (Signed)
   04/22/21 1120  Assess: MEWS Score  Temp (!) 97.5 F (36.4 C)  BP (!) 229/63  Pulse Rate 63  Resp 19  SpO2 100 %  Assess: MEWS Score  MEWS Temp 0  MEWS Systolic 2  MEWS Pulse 0  MEWS RR 0  MEWS LOC 0  MEWS Score 2  MEWS Score Color Yellow  Assess: if the MEWS score is Yellow or Red  Were vital signs taken at a resting state? Yes  Focused Assessment Change from prior assessment (see assessment flowsheet)  Does the patient meet 2 or more of the SIRS criteria? No  MEWS guidelines implemented *See Row Information* No, previously yellow, continue vital signs every 4 hours  Treat  MEWS Interventions Administered prn meds/treatments  Pain Scale 0-10  Pain Score 0  Take Vital Signs  Increase Vital Sign Frequency  Yellow: Q 2hr X 2 then Q 4hr X 2, if remains yellow, continue Q 4hrs  Escalate  MEWS: Escalate Yellow: discuss with charge nurse/RN and consider discussing with provider and RRT  Notify: Charge Nurse/RN  Name of Charge Nurse/RN Notified Toniann Fail RN  Date Charge Nurse/RN Notified 04/22/21  Time Charge Nurse/RN Notified 1120  Notify: Provider  Provider Name/Title [facility]  Date Provider Notified 04/22/21  Time Provider Notified 1120  Notification Type Page  Notification Reason Change in status  Provider response See new orders  Date of Provider Response 04/22/21  Time of Provider Response 1130  Notify: Rapid Response  Name of Rapid Response RN Notified  (NOT CALLED)  Document  Patient Outcome Stabilized after interventions  Progress note created (see row info) Yes  Assess: SIRS CRITERIA  SIRS Temperature  0  SIRS Pulse 0  SIRS Respirations  0  SIRS WBC 0  SIRS Score Sum  0

## 2021-04-23 ENCOUNTER — Inpatient Hospital Stay (HOSPITAL_COMMUNITY): Payer: Medicare Other

## 2021-04-23 DIAGNOSIS — I10 Essential (primary) hypertension: Secondary | ICD-10-CM | POA: Diagnosis not present

## 2021-04-23 DIAGNOSIS — N179 Acute kidney failure, unspecified: Secondary | ICD-10-CM | POA: Diagnosis not present

## 2021-04-23 DIAGNOSIS — N189 Chronic kidney disease, unspecified: Secondary | ICD-10-CM | POA: Diagnosis not present

## 2021-04-23 DIAGNOSIS — K922 Gastrointestinal hemorrhage, unspecified: Secondary | ICD-10-CM | POA: Diagnosis not present

## 2021-04-23 LAB — CBC WITH DIFFERENTIAL/PLATELET
Abs Immature Granulocytes: 0.06 10*3/uL (ref 0.00–0.07)
Basophils Absolute: 0.1 10*3/uL (ref 0.0–0.1)
Basophils Relative: 1 %
Eosinophils Absolute: 0.1 10*3/uL (ref 0.0–0.5)
Eosinophils Relative: 1 %
HCT: 26.6 % — ABNORMAL LOW (ref 36.0–46.0)
Hemoglobin: 8.8 g/dL — ABNORMAL LOW (ref 12.0–15.0)
Immature Granulocytes: 1 %
Lymphocytes Relative: 11 %
Lymphs Abs: 1.3 10*3/uL (ref 0.7–4.0)
MCH: 30.7 pg (ref 26.0–34.0)
MCHC: 33.1 g/dL (ref 30.0–36.0)
MCV: 92.7 fL (ref 80.0–100.0)
Monocytes Absolute: 0.6 10*3/uL (ref 0.1–1.0)
Monocytes Relative: 6 %
Neutro Abs: 9.4 10*3/uL — ABNORMAL HIGH (ref 1.7–7.7)
Neutrophils Relative %: 80 %
Platelets: 275 10*3/uL (ref 150–400)
RBC: 2.87 MIL/uL — ABNORMAL LOW (ref 3.87–5.11)
RDW: 13.2 % (ref 11.5–15.5)
WBC: 11.6 10*3/uL — ABNORMAL HIGH (ref 4.0–10.5)
nRBC: 0 % (ref 0.0–0.2)

## 2021-04-23 LAB — URINALYSIS, ROUTINE W REFLEX MICROSCOPIC
Bilirubin Urine: NEGATIVE
Glucose, UA: NEGATIVE mg/dL
Hgb urine dipstick: NEGATIVE
Ketones, ur: NEGATIVE mg/dL
Leukocytes,Ua: NEGATIVE
Nitrite: NEGATIVE
Protein, ur: 100 mg/dL — AB
Specific Gravity, Urine: 1.02 (ref 1.005–1.030)
pH: 7 (ref 5.0–8.0)

## 2021-04-23 LAB — URINALYSIS, MICROSCOPIC (REFLEX)

## 2021-04-23 LAB — BASIC METABOLIC PANEL
Anion gap: 7 (ref 5–15)
BUN: 29 mg/dL — ABNORMAL HIGH (ref 8–23)
CO2: 24 mmol/L (ref 22–32)
Calcium: 8.7 mg/dL — ABNORMAL LOW (ref 8.9–10.3)
Chloride: 109 mmol/L (ref 98–111)
Creatinine, Ser: 1.73 mg/dL — ABNORMAL HIGH (ref 0.44–1.00)
GFR, Estimated: 26 mL/min — ABNORMAL LOW (ref >60)
Glucose, Bld: 103 mg/dL — ABNORMAL HIGH (ref 70–99)
Potassium: 3.6 mmol/L (ref 3.5–5.1)
Sodium: 140 mmol/L (ref 135–145)

## 2021-04-23 LAB — GLUCOSE, CAPILLARY: Glucose-Capillary: 98 mg/dL (ref 70–99)

## 2021-04-23 MED ORDER — DIPHENHYDRAMINE HCL 50 MG/ML IJ SOLN
25.0000 mg | Freq: Once | INTRAMUSCULAR | Status: AC
  Administered 2021-04-23: 25 mg via INTRAVENOUS
  Filled 2021-04-23: qty 1

## 2021-04-23 MED ORDER — HALOPERIDOL LACTATE 5 MG/ML IJ SOLN
1.0000 mg | Freq: Once | INTRAMUSCULAR | Status: AC
  Administered 2021-04-24: 1 mg via INTRAVENOUS
  Filled 2021-04-23: qty 1

## 2021-04-23 NOTE — Progress Notes (Signed)
Stacy Huang 12:14 PM  Subjective: Patient doing well without any signs of bleeding and no bowel movement since she has been here and tolerating clear liquids and no new complaints  Objective: Vital signs stable afebrile no acute distress abdomen is soft nontender hemoglobin stable BUN decreased creatinine decreased  Assessment: Probable diverticular bleed  Plan: Will allow soft solids call us as needed otherwise reevaluate home blood thinner needs by primary team and see yesterday notes for recommendations if bleeding returns  [facility]  office [phone] After 5PM or if no answer call [phone]

## 2021-04-23 NOTE — Progress Notes (Signed)
PROGRESS NOTE  Stacy Huang ZES:[id number] DOB: 01-28-1921 DOA: 04/21/2021 PCP: Doreen Salvage, PA-C  HPI/Recap of past 24 hours: Stacy Huang is a 87 y.o. female with medical history significant for bilateral lower extremity DVT (02/2020) on Eliquis, CKD stage IIIb, HTN, HLD, and hypothyroidism who presented to the ED for evaluation of rectal bleeding 1 day PTA. Reported 4-5 episodes of dark red blood per rectum with associated mild left-sided abdominal pain. Pt reports a hx of prior rectal bleeding in May 2017 which was assumed to be possible diverticular bleed at [facility]. EGD at that time was unremarkable, patient/family declined colonoscopy. Of note, pt was started on low-dose Eliquis 2.5 mg twice daily in September 2021 when she was found to have bilateral lower extremity DVTs while in the hospital with COVID-19 pneumonia.  It appears this was planned for a 28-month course however it seems patient remained on Eliquis. In the ED, VSS stable other than uncontrolled BP. Labs showed hemoglobin 9.8 (previously 12.5 on 01/24/2021), WBC 13.9, BUN 39, creatinine 2.02 (baseline 1.5-1.6). FOBT is positive. CT abdomen/pelvis without contrast shows diffuse colonic diverticulosis without evidence for diverticulitis. EDP discussed with on-call GI who recommended [facility] admission. Pt admitted for further management    Today, pt denies any new complaints, no further rectal bleeding noted    Assessment/Plan: Principal Problem:   Acute GI bleeding Active Problems:   Acute kidney injury superimposed on CKD (HCC)   History of deep vein thrombosis (DVT) of lower extremity   Essential hypertension   Hypothyroidism   Hyperlipidemia   Compression fracture of L1 vertebra (HCC)   GI bleed   Possible acute lower GI bleed- ?possibly diverticular bleed on Eliquis Acute blood loss anemia Baseline hgb ~ 12, -->9.8 in admission FOBT positive CT abdomen/pelvis without contrast shows diffuse colonic  diverticulosis without evidence for diverticulitis. Transfuse PRBC if hemoglobin <7.0 or recurrent bleeding GI consulted, rec CLD, consider nuclear bleeding scan and IR consultation if recurrent bleeding occurs Continue to hold Eliquis, plan to d/c on discharge Monitor CBC   Acute kidney injury superimposed on CKD stage IIIb Creatinine 2.02 on admission, baseline 1.5-1.6 Likely 2/2 above S/P IVF (held due to uncontrolled BP, encouraged oral drinking)   History of bilateral lower extremity DVT (02/2020) Has been on low-dose Eliquis 2.5 mg twice daily since September 2021 Will d/c Eliquis upon discharge   Hypertension Restart amlodipine IV hydralazine prn   Hypothyroidism Continue Synthroid  Hyperlipidemia Continue pravastatin   L1 vertebral compression fracture Severe L1 vertebral compression fracture seen on CT A/P, new finding when compared to imaging from 2017 Denies any back pain Felt to be subacute or chronic per radiology read      Estimated body mass index is 25.81 kg/m as calculated from the following:   Height as of this encounter: 5\' 2"  (1.575 m).   Weight as of this encounter: 64 kg.     Code Status: DNR  Family Communication: None at bedside  Disposition Plan: Status is: Inpatient  The patient will require care spanning > 2 midnights and should be moved to inpatient because: Level of care     Consultants: GI  Procedures: None  Antimicrobials: None  DVT prophylaxis: SCDs   Objective: Vitals:   04/22/21 1325 04/22/21 2120 04/23/21 0603 04/23/21 1314  BP: (!) 179/46 (!) 156/10 (!) 176/59 (!) 177/52  Pulse: 69 61 75 76  Resp: 17 20 16 19   Temp: (!) 97.5 F (36.4 C) 98.5 F (36.9 C)  98.9 F (37.2 C) (!) 97.5 F (36.4 C)  TempSrc:  Oral Oral   SpO2: 99% 100% 100% 99%  Weight:      Height:        Intake/Output Summary (Last 24 hours) at 04/23/2021 1746 Last data filed at 04/23/2021 0700 Gross per 24 hour  Intake --  Output 500 ml   Net -500 ml   Filed Weights   04/21/21 1228  Weight: 64 kg    Exam: General: NAD, elderly, oriented Cardiovascular: S1, S2 present Respiratory: CTAB Abdomen: Soft, nontender, nondistended, bowel sounds present Musculoskeletal: No bilateral pedal edema noted Skin: Normal Psychiatry: Normal mood     Data Reviewed: CBC: Recent Labs  Lab 04/21/21 1257 04/21/21 1934 04/22/21 0614 04/23/21 0532  WBC 13.9*  --  10.6* 11.6*  NEUTROABS 13.0*  --   --  9.4*  HGB 9.8* 8.9* 8.6* 8.8*  HCT 30.6* 27.8* 27.2* 26.6*  MCV 93.3  --  95.4 92.7  PLT 307  --  245 [id number]   Basic Metabolic Panel: Recent Labs  Lab 04/21/21 1257 04/22/21 0614 04/23/21 0532  NA 141 141 140  K 4.3 3.6 3.6  CL 109 107 109  CO2 23 26 24   GLUCOSE 124* 102* 103*  BUN 39* 38* 29*  CREATININE 2.02* 1.83* 1.73*  CALCIUM 8.7* 8.9 8.7*   GFR: Estimated Creatinine Clearance: 16 mL/min (A) (by C-G formula based on SCr of 1.73 mg/dL (H)). Liver Function Tests: Recent Labs  Lab 04/21/21 1257  AST 17  ALT 11  ALKPHOS 45  BILITOT 0.6  PROT 6.7  ALBUMIN 3.7   No results for input(s): LIPASE, AMYLASE in the last 168 hours. No results for input(s): AMMONIA in the last 168 hours. Coagulation Profile: Recent Labs  Lab 04/21/21 1257 04/22/21 0614  INR 1.0 1.0   Cardiac Enzymes: No results for input(s): CKTOTAL, CKMB, CKMBINDEX, TROPONINI in the last 168 hours. BNP (last 3 results) No results for input(s): PROBNP in the last 8760 hours. HbA1C: No results for input(s): HGBA1C in the last 72 hours. CBG: Recent Labs  Lab 04/23/21 1643  GLUCAP 98   Lipid Profile: No results for input(s): CHOL, HDL, LDLCALC, TRIG, CHOLHDL, LDLDIRECT in the last 72 hours. Thyroid Function Tests: No results for input(s): TSH, T4TOTAL, FREET4, T3FREE, THYROIDAB in the last 72 hours. Anemia Panel: No results for input(s): VITAMINB12, FOLATE, FERRITIN, TIBC, IRON, RETICCTPCT in the last 72 hours. Urine analysis:     Component Value Date/Time   COLORURINE YELLOW 04/23/2021 Joaquin 04/23/2021 0713   LABSPEC 1.020 04/23/2021 0713   PHURINE 7.0 04/23/2021 0713   GLUCOSEU NEGATIVE 04/23/2021 0713   HGBUR NEGATIVE 04/23/2021 0713   BILIRUBINUR NEGATIVE 04/23/2021 0713   KETONESUR NEGATIVE 04/23/2021 0713   PROTEINUR 100 (A) 04/23/2021 0713   NITRITE NEGATIVE 04/23/2021 0713   LEUKOCYTESUR NEGATIVE 04/23/2021 0713   Sepsis Labs: @LABRCNTIP (procalcitonin:4,lacticidven:4)  ) Recent Results (from the past 240 hour(s))  Resp Panel by RT-PCR (Flu A&B, Covid) Nasopharyngeal Swab     Status: None   Collection Time: 04/21/21  7:43 PM   Specimen: Nasopharyngeal Swab; Nasopharyngeal(NP) swabs in vial transport medium  Result Value Ref Range Status   SARS Coronavirus 2 by RT PCR NEGATIVE NEGATIVE Final    Comment: (NOTE) SARS-CoV-2 target nucleic acids are NOT DETECTED.  The SARS-CoV-2 RNA is generally detectable in upper respiratory specimens during the acute phase of infection. The lowest concentration of SARS-CoV-2 viral copies this assay can detect  is 138 copies/mL. A negative result does not preclude SARS-Cov-2 infection and should not be used as the sole basis for treatment or other patient management decisions. A negative result may occur with  improper specimen collection/handling, submission of specimen other than nasopharyngeal swab, presence of viral mutation(s) within the areas targeted by this assay, and inadequate number of viral copies(<138 copies/mL). A negative result must be combined with clinical observations, patient history, and epidemiological information. The expected result is Negative.  Fact Sheet for Patients:  EntrepreneurPulse.com.au  Fact Sheet for Healthcare Providers:  IncredibleEmployment.be  This test is no t yet approved or cleared by the [place] FDA and  has been authorized for detection and/or diagnosis  of SARS-CoV-2 by FDA under an Emergency Use Authorization (EUA). This EUA will remain  in effect (meaning this test can be used) for the duration of the COVID-19 declaration under Section 564(b)(1) of the Act, 21 U.S.C.section 360bbb-3(b)(1), unless the authorization is terminated  or revoked sooner.       Influenza A by PCR NEGATIVE NEGATIVE Final   Influenza B by PCR NEGATIVE NEGATIVE Final    Comment: (NOTE) The Xpert Xpress SARS-CoV-2/FLU/RSV plus assay is intended as an aid in the diagnosis of influenza from Nasopharyngeal swab specimens and should not be used as a sole basis for treatment. Nasal washings and aspirates are unacceptable for Xpert Xpress SARS-CoV-2/FLU/RSV testing.  Fact Sheet for Patients: EntrepreneurPulse.com.au  Fact Sheet for Healthcare Providers: IncredibleEmployment.be  This test is not yet approved or cleared by the [place] FDA and has been authorized for detection and/or diagnosis of SARS-CoV-2 by FDA under an Emergency Use Authorization (EUA). This EUA will remain in effect (meaning this test can be used) for the duration of the COVID-19 declaration under Section 564(b)(1) of the Act, 21 U.S.C. section 360bbb-3(b)(1), unless the authorization is terminated or revoked.  Performed at [facility], [place] [address] [place], [place] [address]       Studies: DG Chest Port 1 View  Result Date: 04/23/2021 CLINICAL DATA:  Leukocytosis EXAM: PORTABLE CHEST 1 VIEW COMPARISON:  January 23, 2021 FINDINGS: The heart size and mediastinal contours are within normal limits. Both lungs are clear. The visualized skeletal structures are stable. IMPRESSION: No active disease. Electronically Signed   By: Abelardo Diesel M.D.   On: 04/23/2021 08:26    Scheduled Meds:  amLODipine  10 mg Oral Daily   levothyroxine  100 mcg Oral Q0600   pravastatin  20 mg Oral q1800    Continuous Infusions:   LOS: 1 day      Alma Friendly, MD [facility]  If 7PM-7AM, please contact night-coverage [email or web address] 04/23/2021, 5:46 PM

## 2021-04-23 NOTE — Progress Notes (Signed)
Patient more confused this evening, climbing out of bed, unaware she's in the hospital. Reoriented, however,  impulsive and unsteady with ambulation. Bed alarm on. Will implement floor mats and continue to monitor.

## 2021-04-23 NOTE — Progress Notes (Signed)
Pt continues to have increased confusion, anxious, talking to people who aren't there, knows she is in the hospital when asked and oriented to self.Played soft jazz music to calm. Helped a little. Informed on call MD Mitzi Hansen, order 25mg  benadryl. Admin pt remains confused and talking to self and fidgety.

## 2021-04-24 ENCOUNTER — Inpatient Hospital Stay (HOSPITAL_COMMUNITY): Payer: Medicare Other

## 2021-04-24 DIAGNOSIS — I10 Essential (primary) hypertension: Secondary | ICD-10-CM | POA: Diagnosis not present

## 2021-04-24 DIAGNOSIS — K922 Gastrointestinal hemorrhage, unspecified: Secondary | ICD-10-CM | POA: Diagnosis not present

## 2021-04-24 DIAGNOSIS — N189 Chronic kidney disease, unspecified: Secondary | ICD-10-CM | POA: Diagnosis not present

## 2021-04-24 DIAGNOSIS — N179 Acute kidney failure, unspecified: Secondary | ICD-10-CM | POA: Diagnosis not present

## 2021-04-24 LAB — CBC WITH DIFFERENTIAL/PLATELET
Abs Immature Granulocytes: 0.09 10*3/uL — ABNORMAL HIGH (ref 0.00–0.07)
Basophils Absolute: 0.1 10*3/uL (ref 0.0–0.1)
Basophils Relative: 1 %
Eosinophils Absolute: 0.1 10*3/uL (ref 0.0–0.5)
Eosinophils Relative: 1 %
HCT: 27.8 % — ABNORMAL LOW (ref 36.0–46.0)
Hemoglobin: 9.3 g/dL — ABNORMAL LOW (ref 12.0–15.0)
Immature Granulocytes: 1 %
Lymphocytes Relative: 9 %
Lymphs Abs: 1.2 10*3/uL (ref 0.7–4.0)
MCH: 30.6 pg (ref 26.0–34.0)
MCHC: 33.5 g/dL (ref 30.0–36.0)
MCV: 91.4 fL (ref 80.0–100.0)
Monocytes Absolute: 0.9 10*3/uL (ref 0.1–1.0)
Monocytes Relative: 7 %
Neutro Abs: 10.9 10*3/uL — ABNORMAL HIGH (ref 1.7–7.7)
Neutrophils Relative %: 81 %
Platelets: 320 10*3/uL (ref 150–400)
RBC: 3.04 MIL/uL — ABNORMAL LOW (ref 3.87–5.11)
RDW: 13.2 % (ref 11.5–15.5)
WBC: 13.2 10*3/uL — ABNORMAL HIGH (ref 4.0–10.5)
nRBC: 0 % (ref 0.0–0.2)

## 2021-04-24 MED ORDER — QUETIAPINE FUMARATE 25 MG PO TABS
25.0000 mg | ORAL_TABLET | Freq: Every day | ORAL | Status: DC
  Administered 2021-04-24: 25 mg via ORAL
  Filled 2021-04-24: qty 1

## 2021-04-24 MED ORDER — LORAZEPAM 2 MG/ML IJ SOLN
0.2500 mg | Freq: Once | INTRAMUSCULAR | Status: AC
  Administered 2021-04-24: 0.25 mg via INTRAVENOUS
  Filled 2021-04-24: qty 1

## 2021-04-24 NOTE — Evaluation (Signed)
Occupational Therapy Evaluation Patient Details Name: Stacy Huang MRN: [id number] DOB: 04-10-1921 Today's Date: 04/24/2021   History of Present Illness Stacy Huang is a 87 y.o. female with medical history significant for bilateral lower extremity DVT (02/2020) on Eliquis, CKD stage IIIb, HTN, HLD, and hypothyroidism who presented to the ED 04/21/21 for evaluation of rectal bleeding 1 day PTA. abdomen/pelvis without contrast shows diffuse colonic diverticulosis without evidence for diverticulitis..CT -Severe L1 vertebral compression fracture seen on CT A/P, new finding when compared to imaging from 2017   Clinical Impression   Patient is a 87 year old female who was admitted for above. Patient was noted to have decreased ability to follow commands with +2 attempt to transfer to [facility] with patient unable to sequence with consistent cues to transition to [facility]. Patient was returned to bed at this time. Patient will need 24/7 care at next level of care. Patient would continue to benefit from skilled OT services at this time while admitted and after d/c to address noted deficits in order to improve overall safety and independence in ADLs.        Recommendations for follow up therapy are one component of a multi-disciplinary discharge planning process, led by the attending physician.  Recommendations may be updated based on patient status, additional functional criteria and insurance authorization.   Follow Up Recommendations  Skilled nursing-short term rehab (<3 hours/day) (or [facility] services with 24/7 care pending family support available)    Assistance Recommended at Discharge Frequent or constant Supervision/Assistance  Functional Status Assessment  Patient has had a recent decline in their functional status and/or demonstrates limited ability to make significant improvements in function in a reasonable and predictable amount of time  Equipment Recommendations  None recommended by OT     Recommendations for Other Services       Precautions / Restrictions Precautions Precautions: Fall Precaution Comments: has mits Restrictions Weight Bearing Restrictions: No      Mobility Bed Mobility Overal bed mobility: Needs Assistance Bed Mobility: Rolling;Supine to Sit;Sit to Supine Rolling: Max assist;+2 for physical assistance;+2 for safety/equipment   Supine to sit: +2 for physical assistance;Max assist;+2 for safety/equipment Sit to supine: +2 for physical assistance;+2 for safety/equipment;Total assist   General bed mobility comments: patient requires assistance, does not follow.  Requires total assistance to return to supine as patient was becoming more restless.    Transfers Overall transfer level: Needs assistance Equipment used: 2 person hand held assist Transfers: Sit to/from Stand Sit to Stand: Max assist;+2 physical assistance;+2 safety/equipment           General transfer comment: Patient stood witrh HHA but unable to  get patient to turn to [facility], Had to manually  assist patient to sit down onto bed and return to supine.      Balance Overall balance assessment: Needs assistance Sitting-balance support: Feet supported;No upper extremity supported Sitting balance-Leahy Scale: Poor Sitting balance - Comments: patient fidgety with mits, does sit at mid line with close guard                                   ADL either performed or assessed with clinical judgement   ADL Overall ADL's : Needs assistance/impaired Eating/Feeding: Maximal assistance;Bed level   Grooming: Maximal assistance;Bed level   Upper Body Bathing: Maximal assistance;Bed level   Lower Body Bathing: Bed level;Maximal assistance   Upper Body Dressing : Bed level;Maximal assistance  Lower Body Dressing: Bed level;Maximal assistance   Toilet Transfer: +2 for safety/equipment;+2 for physical assistance Toilet Transfer Details (indicate cue type and reason): patient  attempted transfer to [facility] with patient unable to advance BLE with +2 to transition back to bed at this time for safety. patient unable to follow commands for transfer. Toileting- Clothing Manipulation and Hygiene: Total assistance;Bed level       Functional mobility during ADLs: +2 for physical assistance;+2 for safety/equipment;Total assistance       Vision   Additional Comments: unable to assess at this time with patients current cognitive functioning     Perception     Praxis      Pertinent Vitals/Pain Pain Assessment: No/denies pain     Hand Dominance Right   Extremity/Trunk Assessment Upper Extremity Assessment Upper Extremity Assessment: Difficult to assess due to impaired cognition;Generalized weakness (patient not noted to have any ROM deficits with patient noted to reach arms all around during session while lying in bed.)   Lower Extremity Assessment Lower Extremity Assessment: Defer to PT evaluation   Cervical / Trunk Assessment Cervical / Trunk Assessment: Normal   Communication Communication Communication: HOH   Cognition Arousal/Alertness: Awake/alert Behavior During Therapy: Restless Overall Cognitive Status: Impaired/Different from baseline                                 General Comments: does not follow directions,  requires frequent tactile cues for activity and attempts to redirection but not able to follow     General Comments       Exercises     Shoulder Instructions      Home Living Family/patient expects to be discharged to:: Private residence Living Arrangements: Children Available Help at Discharge: Family;Available 24 hours/day;Other (Comment) Type of Home: House Home Access: Level entry     Home Layout: One level     Bathroom Shower/Tub: Chief Strategy Officer: Standard         Additional Comments: lives with son who is retired, no family present, patient unable , info from previous encounter.       Prior Functioning/Environment               Mobility Comments: unsure, has shoes and SPC in her room. no family present. ADLs Comments: unable to get more info from patient as she is unable to provide PLOF        OT Problem List: Decreased activity tolerance;Impaired balance (sitting and/or standing);Decreased knowledge of precautions;Decreased knowledge of use of DME or AE;Decreased safety awareness;Decreased cognition      OT Treatment/Interventions: Self-care/ADL training;Therapeutic exercise;Balance training;Patient/family education;DME and/or AE instruction;Energy conservation;Neuromuscular education;Therapeutic activities;Cognitive remediation/compensation    OT Goals(Current goals can be found in the care plan section) Acute Rehab OT Goals Patient Stated Goal: none stated OT Goal Formulation: Patient unable to participate in goal setting Time For Goal Achievement: 05/08/21 Potential to Achieve Goals: Fair  OT Frequency: Min 1X/week   Barriers to D/C:    unknown level of support at home at this time       Co-evaluation PT/OT/SLP Co-Evaluation/Treatment: Yes Reason for Co-Treatment: For patient/therapist safety PT goals addressed during session: Mobility/safety with mobility OT goals addressed during session: ADL's and self-care      AM-PAC OT "6 Clicks" Daily Activity     Outcome Measure Help from another person eating meals?: A Lot Help from another person taking care of personal grooming?: A  Lot Help from another person toileting, which includes using toliet, bedpan, or urinal?: Total Help from another person bathing (including washing, rinsing, drying)?: Total Help from another person to put on and taking off regular upper body clothing?: Total Help from another person to put on and taking off regular lower body clothing?: Total 6 Click Score: 8   End of Session Nurse Communication: Other (comment) (cleared patient to participate)  Activity Tolerance:  Other (comment) (cogntiive processing) Patient left: in bed;with call bell/phone within reach;with chair alarm set;with bed alarm set  OT Visit Diagnosis: Unsteadiness on feet (R26.81);Other abnormalities of gait and mobility (R26.89);Muscle weakness (generalized) (M62.81);History of falling (Z91.81);Other symptoms and signs involving cognitive function                Time: [id number] OT Time Calculation (min): 15 min Charges:  OT General Charges $OT Visit: 1 Visit OT Evaluation $OT Eval Low Complexity: 1 Low  Leota Sauers, MS Acute Rehabilitation Department Office# [phone] Pager# [phone]   [place] 04/24/2021, 2:04 PM

## 2021-04-24 NOTE — Evaluation (Signed)
Physical Therapy Evaluation Patient Details Name: Stacy Huang MRN: [id number] DOB: February 02, 1921 Today's Date: 04/24/2021  History of Present Illness  Stacy Huang is a 87 y.o. female with medical history significant for bilateral lower extremity DVT (02/2020) on Eliquis, CKD stage IIIb, HTN, HLD, and hypothyroidism who presented to the ED 04/21/21 for evaluation of rectal bleeding 1 day PTA. abdomen/pelvis without contrast shows diffuse colonic diverticulosis without evidence for diverticulitis..CT -Severe L1 vertebral compression fracture seen on CT A/P, new finding when compared to imaging from 2017  Clinical Impression  Patient is noted to be restless in bed, mitts on hands. Patient assisted to sitting with 2 max. Stood with 2 HHA and attempted to transfer to [facility], Patient not following and resisting with hands on BSC so returned to seated on bed and back to supine with 2 max assistance.  No family present to provide prior functional level, appears to have been able to ambulate with A SPC , unsure if required assistance.  Depending on  family  available and clearance of MS , patient will benefit from post acute  rehab at SNF vs HHPT.  Pt admitted with above diagnosis.  Pt currently with functional limitations due to the deficits listed below (see PT Problem List). Pt will benefit from skilled PT to increase their independence and safety with mobility to allow discharge to the venue listed below.        Recommendations for follow up therapy are one component of a multi-disciplinary discharge planning process, led by the attending physician.  Recommendations may be updated based on patient status, additional functional criteria and insurance authorization.  Follow Up Recommendations Skilled nursing-short term rehab (<3 hours/day) vs. HH if MS improves and  has 24/7caregivers.    Assistance Recommended at Discharge Frequent or constant Supervision/Assistance  Functional Status Assessment Patient  has had a recent decline in their functional status and demonstrates the ability to make significant improvements in function in a reasonable and predictable amount of time.  Equipment Recommendations  None recommended by PT    Recommendations for Other Services       Precautions / Restrictions Precautions Precautions: Fall Precaution Comments: has mits      Mobility  Bed Mobility Overal bed mobility: Needs Assistance Bed Mobility: Rolling;Supine to Sit;Sit to Supine Rolling: Max assist;+2 for physical assistance;+2 for safety/equipment   Supine to sit: +2 for physical assistance;Max assist;+2 for safety/equipment Sit to supine: +2 for physical assistance;+2 for safety/equipment;Total assist   General bed mobility comments: patient requires assistance, does not follow.  Requires total assistance to return to supine as patient was becoming more restless.    Transfers Overall transfer level: Needs assistance Equipment used: 2 person hand held assist Transfers: Sit to/from Stand Sit to Stand: Max assist;+2 physical assistance;+2 safety/equipment           General transfer comment: Patient stood witrh HHA but unable to  get patient to turn to [facility], Had to manually  assist patient to sit down onto bed and return to supine.    Ambulation/Gait                  Stairs            Wheelchair Mobility    Modified Rankin (Stroke Patients Only)       Balance Overall balance assessment: Needs assistance Sitting-balance support: Feet supported;No upper extremity supported Sitting balance-Leahy Scale: Poor Sitting balance - Comments: patient fidgety with mits, does sit at mid line with  close guard                                     Pertinent Vitals/Pain Pain Assessment: No/denies pain    Home Living Family/patient expects to be discharged to:: Private residence Living Arrangements: Children Available Help at Discharge: Family;Available 24  hours/day;Other (Comment) Type of Home: House Home Access: Level entry       Home Layout: One level   Additional Comments: lives with son who is retired, no family present, patient unable , info from previous encounter.    Prior Function               Mobility Comments: unsure, has shoes and SPC in her room. no family present.       Hand Dominance        Extremity/Trunk Assessment        Lower Extremity Assessment Lower Extremity Assessment: Generalized weakness (able to stnad and bear weight)    Cervical / Trunk Assessment Cervical / Trunk Assessment: Normal  Communication      Cognition Arousal/Alertness: Awake/alert Behavior During Therapy: Restless Overall Cognitive Status: Impaired/Different from baseline                                 General Comments: does not follow directions,  requires frequent tactile cues for activity and attempts to redirection but not able to follow        General Comments      Exercises     Assessment/Plan    PT Assessment Patient needs continued PT services  PT Problem List Decreased mobility;Decreased safety awareness;Decreased activity tolerance;Decreased cognition;Decreased balance;Decreased knowledge of use of DME       PT Treatment Interventions DME instruction;Therapeutic activities;Cognitive remediation;Gait training;Therapeutic exercise;Patient/family education;Functional mobility training    PT Goals (Current goals can be found in the Care Plan section)  Acute Rehab PT Goals PT Goal Formulation: Patient unable to participate in goal setting Time For Goal Achievement: 05/08/21 Potential to Achieve Goals: Fair    Frequency Min 2X/week   Barriers to discharge        Co-evaluation PT/OT/SLP Co-Evaluation/Treatment: Yes Reason for Co-Treatment: For patient/therapist safety PT goals addressed during session: Mobility/safety with mobility OT goals addressed during session: ADL's and  self-care       AM-PAC PT "6 Clicks" Mobility  Outcome Measure Help needed turning from your back to your side while in a flat bed without using bedrails?: A Lot Help needed moving from lying on your back to sitting on the side of a flat bed without using bedrails?: A Lot Help needed moving to and from a bed to a chair (including a wheelchair)?: Total Help needed standing up from a chair using your arms (e.g., wheelchair or bedside chair)?: Total Help needed to walk in hospital room?: Total Help needed climbing 3-5 steps with a railing? : Total 6 Click Score: 8    End of Session   Activity Tolerance: Treatment limited secondary to agitation Patient left: in bed;with call bell/phone within reach;with bed alarm set Nurse Communication: Mobility status PT Visit Diagnosis: Unsteadiness on feet (R26.81)    Time: 1127-1140 PT Time Calculation (min) (ACUTE ONLY): 13 min   Charges:   PT Evaluation $PT Eval Low Complexity: 1 Low          Blanchard Kelch PT Acute Rehabilitation Services Pager  [place]   Claretha Cooper 04/24/2021, 1:20 PM

## 2021-04-24 NOTE — Progress Notes (Signed)
PROGRESS NOTE  Stacy Huang:[id number] DOB: [birth year] DOA: 04/21/2021 PCP: Doreen Salvage, PA-C  HPI/Recap of past 24 hours: Stacy Huang is a 87 y.o. female with medical history significant for bilateral lower extremity DVT (02/2020) on Eliquis, CKD stage IIIb, HTN, HLD, and hypothyroidism who presented to the ED for evaluation of rectal bleeding 1 day PTA. Reported 4-5 episodes of dark red blood per rectum with associated mild left-sided abdominal pain. Pt reports a hx of prior rectal bleeding in May 2017 which was assumed to be possible diverticular bleed at [facility]. EGD at that time was unremarkable, patient/family declined colonoscopy. Of note, pt was started on low-dose Eliquis 2.5 mg twice daily in September 2021 when she was found to have bilateral lower extremity DVTs while in the hospital with COVID-19 pneumonia.  It appears this was planned for a 66-month course however it seems patient remained on Eliquis. In the ED, VSS stable other than uncontrolled BP. Labs showed hemoglobin 9.8 (previously 12.5 on 01/24/2021), WBC 13.9, BUN 39, creatinine 2.02 (baseline 1.5-1.6). FOBT is positive. CT abdomen/pelvis without contrast shows diffuse colonic diverticulosis without evidence for diverticulitis. EDP discussed with on-call GI who recommended TRH admission. Pt admitted for further management    Overnight and this am, pt continues to be disoriented, confused and agitated. No further bleeding per rectum noted.    Assessment/Plan: Principal Problem:   Acute GI bleeding Active Problems:   Acute kidney injury superimposed on CKD (HCC)   History of deep vein thrombosis (DVT) of lower extremity   Essential hypertension   Hypothyroidism   Hyperlipidemia   Compression fracture of L1 vertebra (HCC)   GI bleed   Possible acute lower GI bleed- ?possibly diverticular bleed on Eliquis Acute blood loss anemia No further bleeding since admission Baseline hgb ~ 12, -->9.8 in  admission FOBT positive CT abdomen/pelvis without contrast shows diffuse colonic diverticulosis without evidence for diverticulitis. Transfuse PRBC if hemoglobin <7.0 or recurrent bleeding GI consulted, consider nuclear bleeding scan and IR consultation if recurrent bleeding occurs Advanced diet Continue to hold Eliquis, plan to d/c on discharge Monitor CBC  Acute metabolic encephalopathy Likely sun-downing/hospital delirium Noted leukocytosis but afebrile UA neg, CXR unremarkable CT head pending Start seroquel, safety sitter   Acute kidney injury superimposed on CKD stage IIIb Creatinine 2.02 on admission, baseline 1.5-1.6 Likely 2/2 above S/P IVF (held due to uncontrolled BP, encouraged oral drinking)   History of bilateral lower extremity DVT (02/2020) Has been on low-dose Eliquis 2.5 mg twice daily since September 2021 Will d/c Eliquis upon discharge   Hypertension Restart amlodipine IV hydralazine prn   Hypothyroidism Continue Synthroid  Hyperlipidemia Continue pravastatin   L1 vertebral compression fracture Severe L1 vertebral compression fracture seen on CT A/P, new finding when compared to imaging from 2017 Denies any back pain Felt to be subacute or chronic per radiology read      Estimated body mass index is 25.81 kg/m as calculated from the following:   Height as of this encounter: 5\' 2"  (1.575 m).   Weight as of this encounter: 64 kg.     Code Status: DNR  Family Communication: Discussed with son over the phone  Disposition Plan: Status is: Inpatient  The patient will require care spanning > 2 midnights and should be moved to inpatient because: Level of care     Consultants: GI  Procedures: None  Antimicrobials: None  DVT prophylaxis: SCDs   Objective: Vitals:   04/23/21 1937 04/24/21 04/26/21  04/24/21 1248 04/24/21 1250  BP: (!) 174/78 (!) 135/96 108/79   Pulse: 74 74 87   Resp: 16 16 18    Temp: 98 F (36.7 C) 98 F (36.7 C)   (!) 97.5 F (36.4 C)  TempSrc: Oral Oral  Oral  SpO2: 100% 100% 100%   Weight:      Height:        Intake/Output Summary (Last 24 hours) at 04/24/2021 1730 Last data filed at 04/24/2021 0901 Gross per 24 hour  Intake 240 ml  Output --  Net 240 ml   Filed Weights   04/21/21 1228  Weight: 64 kg    Exam: General: NAD, elderly, disoriented Cardiovascular: S1, S2 present Respiratory: CTAB Abdomen: Soft, nontender, nondistended, bowel sounds present Musculoskeletal: No bilateral pedal edema noted Skin: Normal Psychiatry: Agitated mood     Data Reviewed: CBC: Recent Labs  Lab 04/21/21 1257 04/21/21 1934 04/22/21 0614 04/23/21 0532 04/24/21 0503  WBC 13.9*  --  10.6* 11.6* 13.2*  NEUTROABS 13.0*  --   --  9.4* 10.9*  HGB 9.8* 8.9* 8.6* 8.8* 9.3*  HCT 30.6* 27.8* 27.2* 26.6* 27.8*  MCV 93.3  --  95.4 92.7 91.4  PLT 307  --  245 275 [id number]   Basic Metabolic Panel: Recent Labs  Lab 04/21/21 1257 04/22/21 0614 04/23/21 0532  NA 141 141 140  K 4.3 3.6 3.6  CL 109 107 109  CO2 23 26 24   GLUCOSE 124* 102* 103*  BUN 39* 38* 29*  CREATININE 2.02* 1.83* 1.73*  CALCIUM 8.7* 8.9 8.7*   GFR: Estimated Creatinine Clearance: 16 mL/min (A) (by C-G formula based on SCr of 1.73 mg/dL (H)). Liver Function Tests: Recent Labs  Lab 04/21/21 1257  AST 17  ALT 11  ALKPHOS 45  BILITOT 0.6  PROT 6.7  ALBUMIN 3.7   No results for input(s): LIPASE, AMYLASE in the last 168 hours. No results for input(s): AMMONIA in the last 168 hours. Coagulation Profile: Recent Labs  Lab 04/21/21 1257 04/22/21 0614  INR 1.0 1.0   Cardiac Enzymes: No results for input(s): CKTOTAL, CKMB, CKMBINDEX, TROPONINI in the last 168 hours. BNP (last 3 results) No results for input(s): PROBNP in the last 8760 hours. HbA1C: No results for input(s): HGBA1C in the last 72 hours. CBG: Recent Labs  Lab 04/23/21 1643  GLUCAP 98   Lipid Profile: No results for input(s): CHOL, HDL, LDLCALC,  TRIG, CHOLHDL, LDLDIRECT in the last 72 hours. Thyroid Function Tests: No results for input(s): TSH, T4TOTAL, FREET4, T3FREE, THYROIDAB in the last 72 hours. Anemia Panel: No results for input(s): VITAMINB12, FOLATE, FERRITIN, TIBC, IRON, RETICCTPCT in the last 72 hours. Urine analysis:    Component Value Date/Time   COLORURINE YELLOW 04/23/2021 [place] 04/23/2021 0713   LABSPEC 1.020 04/23/2021 0713   PHURINE 7.0 04/23/2021 0713   GLUCOSEU NEGATIVE 04/23/2021 0713   HGBUR NEGATIVE 04/23/2021 0713   BILIRUBINUR NEGATIVE 04/23/2021 0713   KETONESUR NEGATIVE 04/23/2021 0713   PROTEINUR 100 (A) 04/23/2021 0713   NITRITE NEGATIVE 04/23/2021 0713   LEUKOCYTESUR NEGATIVE 04/23/2021 0713   Sepsis Labs: @LABRCNTIP (procalcitonin:4,lacticidven:4)  ) Recent Results (from the past 240 hour(s))  Resp Panel by RT-PCR (Flu A&B, Covid) Nasopharyngeal Swab     Status: None   Collection Time: 04/21/21  7:43 PM   Specimen: Nasopharyngeal Swab; Nasopharyngeal(NP) swabs in vial transport medium  Result Value Ref Range Status   SARS Coronavirus 2 by RT PCR NEGATIVE NEGATIVE Final  Comment: (NOTE) SARS-CoV-2 target nucleic acids are NOT DETECTED.  The SARS-CoV-2 RNA is generally detectable in upper respiratory specimens during the acute phase of infection. The lowest concentration of SARS-CoV-2 viral copies this assay can detect is 138 copies/mL. A negative result does not preclude SARS-Cov-2 infection and should not be used as the sole basis for treatment or other patient management decisions. A negative result may occur with  improper specimen collection/handling, submission of specimen other than nasopharyngeal swab, presence of viral mutation(s) within the areas targeted by this assay, and inadequate number of viral copies(<138 copies/mL). A negative result must be combined with clinical observations, patient history, and epidemiological information. The expected result  is Negative.  Fact Sheet for Patients:  EntrepreneurPulse.com.au  Fact Sheet for Healthcare Providers:  IncredibleEmployment.be  This test is no t yet approved or cleared by the [place] FDA and  has been authorized for detection and/or diagnosis of SARS-CoV-2 by FDA under an Emergency Use Authorization (EUA). This EUA will remain  in effect (meaning this test can be used) for the duration of the COVID-19 declaration under Section 564(b)(1) of the Act, 21 U.S.C.section 360bbb-3(b)(1), unless the authorization is terminated  or revoked sooner.       Influenza A by PCR NEGATIVE NEGATIVE Final   Influenza B by PCR NEGATIVE NEGATIVE Final    Comment: (NOTE) The Xpert Xpress SARS-CoV-2/FLU/RSV plus assay is intended as an aid in the diagnosis of influenza from Nasopharyngeal swab specimens and should not be used as a sole basis for treatment. Nasal washings and aspirates are unacceptable for Xpert Xpress SARS-CoV-2/FLU/RSV testing.  Fact Sheet for Patients: EntrepreneurPulse.com.au  Fact Sheet for Healthcare Providers: IncredibleEmployment.be  This test is not yet approved or cleared by the [place] FDA and has been authorized for detection and/or diagnosis of SARS-CoV-2 by FDA under an Emergency Use Authorization (EUA). This EUA will remain in effect (meaning this test can be used) for the duration of the COVID-19 declaration under Section 564(b)(1) of the Act, 21 U.S.C. section 360bbb-3(b)(1), unless the authorization is terminated or revoked.  Performed at [facility], [place] [address] [place],  [address]       Studies: DG Abd Portable 1V  Result Date: 04/24/2021 CLINICAL DATA:  Constipation EXAM: PORTABLE ABDOMEN - 1 VIEW COMPARISON:  CT from 3 days ago FINDINGS: No excessive stool retention. Colonic diverticulosis with gas-filled diverticula seen along the  sigmoid colon and confirmed by prior CT. Dystrophic calcification in the pelvis from uterine fibroids. Atheromatous calcification. No concerning mass effect or gas collection IMPRESSION: Normal bowel gas pattern.  Stool volume is moderate. Electronically Signed   By: Jorje Guild M.D.   On: 04/24/2021 07:41    Scheduled Meds:  amLODipine  10 mg Oral Daily   levothyroxine  100 mcg Oral Q0600   pravastatin  20 mg Oral q1800   QUEtiapine  25 mg Oral QHS    Continuous Infusions:   LOS: 2 days     Alma Friendly, MD [facility]  If 7PM-7AM, please contact night-coverage [email or web address] 04/24/2021, 5:30 PM

## 2021-04-25 DIAGNOSIS — N179 Acute kidney failure, unspecified: Secondary | ICD-10-CM | POA: Diagnosis not present

## 2021-04-25 DIAGNOSIS — E785 Hyperlipidemia, unspecified: Secondary | ICD-10-CM | POA: Diagnosis not present

## 2021-04-25 DIAGNOSIS — I10 Essential (primary) hypertension: Secondary | ICD-10-CM | POA: Diagnosis not present

## 2021-04-25 DIAGNOSIS — E039 Hypothyroidism, unspecified: Secondary | ICD-10-CM

## 2021-04-25 DIAGNOSIS — K922 Gastrointestinal hemorrhage, unspecified: Secondary | ICD-10-CM | POA: Diagnosis not present

## 2021-04-25 LAB — CBC WITH DIFFERENTIAL/PLATELET
Abs Immature Granulocytes: 0.07 10*3/uL (ref 0.00–0.07)
Basophils Absolute: 0.1 10*3/uL (ref 0.0–0.1)
Basophils Relative: 0 %
Eosinophils Absolute: 0.2 10*3/uL (ref 0.0–0.5)
Eosinophils Relative: 2 %
HCT: 26.8 % — ABNORMAL LOW (ref 36.0–46.0)
Hemoglobin: 8.9 g/dL — ABNORMAL LOW (ref 12.0–15.0)
Immature Granulocytes: 1 %
Lymphocytes Relative: 10 %
Lymphs Abs: 1.1 10*3/uL (ref 0.7–4.0)
MCH: 30.7 pg (ref 26.0–34.0)
MCHC: 33.2 g/dL (ref 30.0–36.0)
MCV: 92.4 fL (ref 80.0–100.0)
Monocytes Absolute: 0.8 10*3/uL (ref 0.1–1.0)
Monocytes Relative: 7 %
Neutro Abs: 9.5 10*3/uL — ABNORMAL HIGH (ref 1.7–7.7)
Neutrophils Relative %: 80 %
Platelets: 312 10*3/uL (ref 150–400)
RBC: 2.9 MIL/uL — ABNORMAL LOW (ref 3.87–5.11)
RDW: 13.4 % (ref 11.5–15.5)
WBC: 11.6 10*3/uL — ABNORMAL HIGH (ref 4.0–10.5)
nRBC: 0 % (ref 0.0–0.2)

## 2021-04-25 MED ORDER — AMLODIPINE BESYLATE 10 MG PO TABS: 10.0000 mg | ORAL_TABLET | Freq: Every day | ORAL | 0 refills | Status: DC

## 2021-04-25 MED ORDER — LEVOTHYROXINE SODIUM 100 MCG PO TABS: 100.0000 ug | ORAL_TABLET | Freq: Every day | ORAL | 0 refills | Status: DC

## 2021-04-25 MED ORDER — HYDRALAZINE HCL 20 MG/ML IJ SOLN: 10.0000 mg | Freq: Three times a day (TID) | INTRAMUSCULAR | Status: DC | PRN

## 2021-04-25 NOTE — TOC Transition Note (Signed)
Transition of Care [facility]) - CM/SW Discharge Note   Patient Details  Name: Stacy Huang MRN: [id number] Date of Birth: 08-18-1920  [facility] CM/SW Contact:  Ida Rogue, LCSW Phone Number: 04/25/2021, 10:44 AM   Clinical Narrative:   Called son of patient, Mr Kathlen Mody, to follow up on recommendation of PT,OT for SNF.  Mr Kathlen Mody has both seen her in person and spoken to her by phone since admission, states he sees her confusion but there were no signs of that prior to admission, and since she was ambulatory at home, he would prefer to take her home with [facility] services and see how it goes.  Spoke with Pearson Grippe with [facility] who confirms their ability to provide services in the home.  Son confirms having all needed DME. No further needs identified.  TOC sign off.    Final next level of care: Home w Home Health Services Barriers to Discharge: No Barriers Identified   Patient Goals and CMS Choice        Discharge Placement                       Discharge Plan and Services                                     Social Determinants of Health (SDOH) Interventions     Readmission Risk Interventions No flowsheet data found.

## 2021-04-25 NOTE — Discharge Summary (Signed)
Discharge Summary  Stacy Huang [id number] DOB: 03-01-1921  PCP: Egbert Garibaldi, PA-C  Admit date: 04/21/2021 Discharge date: 04/25/2021  Time spent: 40 mins  Recommendations for Outpatient Follow-up:  Follow-up with PCP in 1 week   Discharge Diagnoses:  Active Hospital Problems   Diagnosis Date Noted   Acute GI bleeding 04/21/2021   GI bleed 04/22/2021   History of deep vein thrombosis (DVT) of lower extremity 04/21/2021   Essential hypertension 04/21/2021   Hypothyroidism 04/21/2021   Hyperlipidemia 04/21/2021   Compression fracture of L1 vertebra ([place]) 04/21/2021   Acute kidney injury superimposed on CKD ([place]) 02/27/2020    Resolved Hospital Problems  No resolved problems to display.    Discharge Condition: Stable  Diet recommendation: As tolerated/soft diet  Vitals:   04/25/21 0328 04/25/21 1012  BP: (!) 117/52 (!) 170/63  Pulse: 62   Resp: 20   Temp: 97.7 F (36.5 C)   SpO2: 95%     History of present illness:  Stacy Huang is a 87 y.o. female with medical history significant for bilateral lower extremity DVT (02/2020) on Eliquis, CKD stage IIIb, HTN, HLD, and hypothyroidism who presented to the ED for evaluation of rectal bleeding 1 day PTA. Reported 4-5 episodes of dark red blood per rectum with associated mild left-sided abdominal pain. Pt reports a hx of prior rectal bleeding in May 2017 which was assumed to be possible diverticular bleed at [facility]. EGD at that time was unremarkable, patient/family declined colonoscopy. Of note, pt was started on low-dose Eliquis 2.5 mg twice daily in September 2021 when she was found to have bilateral lower extremity DVTs while in the hospital with COVID-19 pneumonia.  It appears this was planned for a 2-month course however it seems patient remained on Eliquis. In the ED, VSS stable other than uncontrolled BP. Labs showed hemoglobin 9.8 (previously 12.5 on 01/24/2021), WBC 13.9, BUN 39, creatinine 2.02  (baseline 1.5-1.6). FOBT is positive. CT abdomen/pelvis without contrast shows diffuse colonic diverticulosis without evidence for diverticulitis. EDP discussed with on-call GI who recommended [facility] admission. Pt admitted for further management    Today, patient still noted to be disoriented but a little bit redirectable, likely 2/2 hospital delirium versus mild cognitive impairment.  No further bleeding per rectum, denies any abdominal pain, chest pain, shortness of breath, no focal neurologic deficits noted.  Discussed extensively with son, about the need for home health assistance, agreeable.  Patient will need assistance with especially her medications as he had been noted that she or her son does not know what medication she takes.  Advised her son to be more involved especially with her medication. The option of SNF came up with therapy, but son insisted on taking patient home, which I am in agreement with as elderly patients do better when they are in a familiar environment. Discharge patient on home health PT/OT/RN/aide.    Hospital Course:  Principal Problem:   Acute GI bleeding Active Problems:   Acute kidney injury superimposed on CKD ([place])   History of deep vein thrombosis (DVT) of lower extremity   Essential hypertension   Hypothyroidism   Hyperlipidemia   Compression fracture of L1 vertebra ([facility])   GI bleed   Possible acute lower GI bleed- ?possibly diverticular bleed on Eliquis Acute blood loss anemia No further bleeding since admission Baseline hgb ~ 12, -->9.8 in admission, remained stable FOBT positive CT abdomen/pelvis without contrast shows diffuse colonic diverticulosis without evidence for diverticulitis GI consulted, watchful management  Tolerated advanced diet Discontinue Eliquis as patient has completed 6 months of treatment for DVT in 02/2020 Follow-up with PCP to repeat labs   Acute metabolic encephalopathy Likely sun-downing/hospital delirium Noted resolving  leukocytosis but afebrile UA neg, CXR unremarkable CT head no acute abnormality Follow-up with PCP   Acute kidney injury superimposed on CKD stage IIIb Creatinine 2.02 on admission, baseline 1.5-1.6, currently around baseline Encourage oral fluid intake Follow-up with PCP   History of bilateral lower extremity DVT (02/2020) Has been on low-dose Eliquis 2.5 mg twice daily since September 2021 Discontinue Eliquis   Hypertension Uncontrolled Increased amlodipine to 10 mg daily Follow-up with PCP for further management   Hypothyroidism Continue Synthroid  Hyperlipidemia Continue pravastatin   L1 vertebral compression fracture Severe L1 vertebral compression fracture seen on CT A/P, new finding when compared to imaging from 2017 Denies any back pain Felt to be subacute or chronic per radiology read        Estimated body mass index is 25.81 kg/m as calculated from the following:   Height as of this encounter: 5\' 2"  (1.575 m).   Weight as of this encounter: 64 kg.    Procedures: None  Consultations: GI  Discharge Exam: BP (!) 170/63   Pulse 62   Temp 97.7 F (36.5 C) (Axillary)   Resp 20   Ht 5\' 2"  (1.575 m)   Wt 64 kg   SpO2 95%   BMI 25.81 kg/m   General: NAD Cardiovascular: S1, S2 present Respiratory: CTAB Abdomen: Soft, nontender, nondistended, bowel sounds present Musculoskeletal: No bilateral pedal edema noted Skin: Normal Psychiatry: Agitated mood   Discharge Instructions You were cared for by a hospitalist during your hospital stay. If you have any questions about your discharge medications or the care you received while you were in the hospital after you are discharged, you can call the unit and asked to speak with the hospitalist on call if the hospitalist that took care of you is not available. Once you are discharged, your primary care physician will handle any further medical issues. Please note that NO REFILLS for any discharge medications will  be authorized once you are discharged, as it is imperative that you return to your primary care physician (or establish a relationship with a primary care physician if you do not have one) for your aftercare needs so that they can reassess your need for medications and monitor your lab values.   Allergies as of 04/25/2021       Reactions   Lactose Nausea Only, Other (See Comments)   GI Upset- Upsets the stomach   Lactose Intolerance (gi) Other (See Comments)   Upsets the stomach        Medication List     STOP taking these medications    apixaban 2.5 MG Tabs tablet Commonly known as: ELIQUIS       TAKE these medications    acetaminophen 325 MG tablet Commonly known as: TYLENOL Take 2 tablets (650 mg total) by mouth every 6 (six) hours as needed for mild pain or headache (fever >/= 101).   amLODipine 10 MG tablet Commonly known as: NORVASC Take 1 tablet (10 mg total) by mouth daily. What changed: Another medication with the same name was removed. Continue taking this medication, and follow the directions you see here.   guaiFENesin-dextromethorphan 100-10 MG/5ML syrup Commonly known as: ROBITUSSIN DM Take 5 mLs by mouth every 6 (six) hours as needed for cough.   levothyroxine 100 MCG tablet Commonly  known as: SYNTHROID Take 1 tablet (100 mcg total) by mouth daily before breakfast.   meclizine 25 MG tablet Commonly known as: ANTIVERT Take 25 mg by mouth 3 (three) times daily as needed for dizziness.   pravastatin 20 MG tablet Commonly known as: PRAVACHOL Take 20 mg by mouth daily.   Vitamin C Chew Chew 1 each by mouth daily.       Allergies  Allergen Reactions   Lactose Nausea Only and Other (See Comments)    GI Upset- Upsets the stomach   Lactose Intolerance (Gi) Other (See Comments)    Upsets the stomach    Follow-up Information     [facility] Follow up.   Why: This is the home health agency that will call you about  setting up a time to come out within 48 hours of d/c from the hospital Contact information: [address] [place] [address] [phone]         Doreen Salvage, PA-C. Schedule an appointment as soon as possible for a visit in 1 week(s).   Specialty: Internal Medicine Contact information: [address] [place] [address] [phone]                  The results of significant diagnostics from this hospitalization (including imaging, microbiology, ancillary and laboratory) are listed below for reference.    Significant Diagnostic Studies: CT ABDOMEN PELVIS WO CONTRAST  Result Date: 04/21/2021 CLINICAL DATA:  Rectal bleeding left lower quadrant pain. EXAM: CT ABDOMEN AND PELVIS WITHOUT CONTRAST TECHNIQUE: Multidetector CT imaging of the abdomen and pelvis was performed following the standard protocol without IV contrast. COMPARISON:  CT abdomen and pelvis 10/30/2015. FINDINGS: Lower chest: There is some scarring in the left upper lobe. Hepatobiliary: No focal liver abnormality is seen. No gallstones, gallbladder wall thickening, or biliary dilatation. Pancreas: Unremarkable. No pancreatic ductal dilatation or surrounding inflammatory changes. Spleen: Normal in size without focal abnormality. Adrenals/Urinary Tract: There are numerous bilateral renal cysts, left greater than right. The largest cyst is in the left lower pole measuring 7.1 cm, mildly increased in size. There is no hydronephrosis. The adrenal glands and bladder are within normal limits. Stomach/Bowel: Stomach is within normal limits. Appendix appears normal. No evidence of bowel wall thickening, distention, or inflammatory changes. There is diffuse colonic diverticulosis without evidence for acute diverticulitis. Vascular/Lymphatic: Aortic atherosclerosis. No enlarged abdominal or pelvic lymph nodes. Reproductive: There are calcified uterine fibroids with the largest measuring 3.6 cm. These have mildly  increased in size compared to the prior study. Adnexa are within normal limits. Other: No abdominal wall hernia or abnormality. No abdominopelvic ascites. Musculoskeletal: There is a severe compression fracture of L1 which is new from 2017. There is mild retropulsion of fracture fragment along the superior endplate. This is age indeterminate, but favored as subacute or chronic. IMPRESSION: 1. Diffuse colonic diverticulosis without evidence for diverticulitis. 2. Calcified uterine fibroids. 3. L1 compression fracture is new from 2017 and favored as crit subacute or chronic. Please correlate clinically. 4.  Aortic Atherosclerosis (ICD10-I70.0). Electronically Signed   By: Darliss Cheney M.D.   On: 04/21/2021 18:27   CT HEAD WO CONTRAST ( )  Result Date: 04/24/2021 CLINICAL DATA:  Altered mental status EXAM: CT HEAD WITHOUT CONTRAST TECHNIQUE: Contiguous axial images were obtained from the base of the skull through the vertex without intravenous contrast. COMPARISON:  04/21/2021 FINDINGS: Brain: No evidence of acute infarction, hemorrhage, hydrocephalus, extra-axial collection or mass lesion/mass effect.  Chronic atrophic and ischemic changes are again identified and stable. Vascular: No hyperdense vessel or unexpected calcification. Skull: Normal. Negative for fracture or focal lesion. Sinuses/Orbits: No acute finding. Other: Mild degenerative changes of the cervical spine are noted. IMPRESSION: Chronic atrophic and ischemic changes without acute abnormality. Electronically Signed   By: Inez Catalina M.D.   On: 04/24/2021 19:22   CT Head Wo Contrast  Result Date: 04/21/2021 CLINICAL DATA:  Headache. EXAM: CT HEAD WITHOUT CONTRAST TECHNIQUE: Contiguous axial images were obtained from the base of the skull through the vertex without intravenous contrast. COMPARISON:  CT head dated January 23, 2021. FINDINGS: Brain: No evidence of acute infarction, hemorrhage, hydrocephalus, extra-axial collection or mass  lesion/mass effect. Stable atrophy and chronic microvascular ischemic changes. Vascular: Atherosclerotic vascular calcification of the carotid siphons. No hyperdense vessel. Skull: Normal. Negative for fracture or focal lesion. Sinuses/Orbits: No acute finding. Other: None. IMPRESSION: 1. No acute intracranial abnormality. 2. Stable atrophy and chronic microvascular ischemic changes. Electronically Signed   By: Titus Dubin M.D.   On: 04/21/2021 18:20   DG Chest Port 1 View  Result Date: 04/23/2021 CLINICAL DATA:  Leukocytosis EXAM: PORTABLE CHEST 1 VIEW COMPARISON:  January 23, 2021 FINDINGS: The heart size and mediastinal contours are within normal limits. Both lungs are clear. The visualized skeletal structures are stable. IMPRESSION: No active disease. Electronically Signed   By: Abelardo Diesel M.D.   On: 04/23/2021 08:26   DG Abd Portable 1V  Result Date: 04/24/2021 CLINICAL DATA:  Constipation EXAM: PORTABLE ABDOMEN - 1 VIEW COMPARISON:  CT from 3 days ago FINDINGS: No excessive stool retention. Colonic diverticulosis with gas-filled diverticula seen along the sigmoid colon and confirmed by prior CT. Dystrophic calcification in the pelvis from uterine fibroids. Atheromatous calcification. No concerning mass effect or gas collection IMPRESSION: Normal bowel gas pattern.  Stool volume is moderate. Electronically Signed   By: Jorje Guild M.D.   On: 04/24/2021 07:41    Microbiology: Recent Results (from the past 240 hour(s))  Resp Panel by RT-PCR (Flu A&B, Covid) Nasopharyngeal Swab     Status: None   Collection Time: 04/21/21  7:43 PM   Specimen: Nasopharyngeal Swab; Nasopharyngeal(NP) swabs in vial transport medium  Result Value Ref Range Status   SARS Coronavirus 2 by RT PCR NEGATIVE NEGATIVE Final    Comment: (NOTE) SARS-CoV-2 target nucleic acids are NOT DETECTED.  The SARS-CoV-2 RNA is generally detectable in upper respiratory specimens during the acute phase of infection. The  lowest concentration of SARS-CoV-2 viral copies this assay can detect is 138 copies/mL. A negative result does not preclude SARS-Cov-2 infection and should not be used as the sole basis for treatment or other patient management decisions. A negative result may occur with  improper specimen collection/handling, submission of specimen other than nasopharyngeal swab, presence of viral mutation(s) within the areas targeted by this assay, and inadequate number of viral copies(<138 copies/mL). A negative result must be combined with clinical observations, patient history, and epidemiological information. The expected result is Negative.  Fact Sheet for Patients:  EntrepreneurPulse.com.au  Fact Sheet for Healthcare Providers:  IncredibleEmployment.be  This test is no t yet approved or cleared by the [place] FDA and  has been authorized for detection and/or diagnosis of SARS-CoV-2 by FDA under an Emergency Use Authorization (EUA). This EUA will remain  in effect (meaning this test can be used) for the duration of the COVID-19 declaration under Section 564(b)(1) of the Act, 21 U.S.C.section 360bbb-3(b)(1), unless the  authorization is terminated  or revoked sooner.       Influenza A by PCR NEGATIVE NEGATIVE Final   Influenza B by PCR NEGATIVE NEGATIVE Final    Comment: (NOTE) The Xpert Xpress SARS-CoV-2/FLU/RSV plus assay is intended as an aid in the diagnosis of influenza from Nasopharyngeal swab specimens and should not be used as a sole basis for treatment. Nasal washings and aspirates are unacceptable for Xpert Xpress SARS-CoV-2/FLU/RSV testing.  Fact Sheet for Patients: EntrepreneurPulse.com.au  Fact Sheet for Healthcare Providers: IncredibleEmployment.be  This test is not yet approved or cleared by the [place] FDA and has been authorized for detection and/or diagnosis of SARS-CoV-2 by FDA under  an Emergency Use Authorization (EUA). This EUA will remain in effect (meaning this test can be used) for the duration of the COVID-19 declaration under Section 564(b)(1) of the Act, 21 U.S.C. section 360bbb-3(b)(1), unless the authorization is terminated or revoked.  Performed at [facility], [place] [address] [place], [place] [address]      Labs: Basic Metabolic Panel: Recent Labs  Lab 04/21/21 1257 04/22/21 0614 04/23/21 0532  NA 141 141 140  K 4.3 3.6 3.6  CL 109 107 109  CO2 23 26 24   GLUCOSE 124* 102* 103*  BUN 39* 38* 29*  CREATININE 2.02* 1.83* 1.73*  CALCIUM 8.7* 8.9 8.7*   Liver Function Tests: Recent Labs  Lab 04/21/21 1257  AST 17  ALT 11  ALKPHOS 45  BILITOT 0.6  PROT 6.7  ALBUMIN 3.7   No results for input(s): LIPASE, AMYLASE in the last 168 hours. No results for input(s): AMMONIA in the last 168 hours. CBC: Recent Labs  Lab 04/21/21 1257 04/21/21 1934 04/22/21 0614 04/23/21 0532 04/24/21 0503 04/25/21 0452  WBC 13.9*  --  10.6* 11.6* 13.2* 11.6*  NEUTROABS 13.0*  --   --  9.4* 10.9* 9.5*  HGB 9.8* 8.9* 8.6* 8.8* 9.3* 8.9*  HCT 30.6* 27.8* 27.2* 26.6* 27.8* 26.8*  MCV 93.3  --  95.4 92.7 91.4 92.4  PLT 307  --  245 275 320 312   Cardiac Enzymes: No results for input(s): CKTOTAL, CKMB, CKMBINDEX, TROPONINI in the last 168 hours. BNP: BNP (last 3 results) No results for input(s): BNP in the last 8760 hours.  ProBNP (last 3 results) No results for input(s): PROBNP in the last 8760 hours.  CBG: Recent Labs  Lab 04/23/21 1643  GLUCAP 98       Signed:  Alma Friendly, MD [facility] 04/25/2021, 11:54 AM

## 2021-10-09 ENCOUNTER — Encounter: Payer: Self-pay | Admitting: Cardiology

## 2021-10-09 ENCOUNTER — Ambulatory Visit: Payer: Medicare Other | Admitting: Cardiology

## 2021-10-09 VITALS — BP 195/77 | HR 63 | Temp 98.7°F | Resp 16 | Ht 62.0 in | Wt 140.0 lb

## 2021-10-09 DIAGNOSIS — R42 Dizziness and giddiness: Secondary | ICD-10-CM | POA: Insufficient documentation

## 2021-10-09 DIAGNOSIS — I10 Essential (primary) hypertension: Secondary | ICD-10-CM

## 2021-10-09 DIAGNOSIS — N1832 Chronic kidney disease, stage 3b: Secondary | ICD-10-CM

## 2021-10-09 MED ORDER — ISOSORBIDE DINITRATE 30 MG PO TABS: 30.0000 mg | ORAL_TABLET | Freq: Three times a day (TID) | ORAL | 2 refills | Status: DC

## 2021-10-09 MED ORDER — HYDRALAZINE HCL 50 MG PO TABS: 50.0000 mg | ORAL_TABLET | Freq: Three times a day (TID) | ORAL | 0 refills | Status: DC

## 2021-10-09 NOTE — Progress Notes (Signed)
? ?Primary Physician/Referring:  Stephani Police, FNP ? ?Patient ID: Stacy Huang, female    DOB: 09/15/1920, 88 y.o.   MRN: [id number] ? ?No chief complaint on file. ? ?HPI:   ? ?Stacy Huang  is a 88 y.o. African-American femalemale patient with hypertension, hyperlipidemia, chronic stage IIIb kidney disease, hypothyroidism is referred to me for management of hypertension. ? ?She has chronic dizziness, occasional episodes of headache, she is mostly being managed at [facility], her last emergency room visit on 08/28/2021, also she has had admission with hypertensive crisis on 01/23/2021 and the blood pressure was 249/73 mmHg with dizziness and headache. ? ?Except for chronic dizziness, she has no specific complaints. ? ?Past Medical History:  ?Diagnosis Date  ? Hypertension   ? Shoulder fracture, right   ? Thyroid disease   ? ?History reviewed. No pertinent surgical history. ?Family History  ?Problem Relation Age of Onset  ? Alzheimer's disease Mother   ? Hypertension Father   ? Other Sister   ?     Died at birth  ? Cancer Brother   ?     spinal  ? Heart attack Brother   ?  ?Social History  ? ?Tobacco Use  ? Smoking status: Never  ? Smokeless tobacco: Never  ?Substance Use Topics  ? Alcohol use: Never  ? ?Marital Status: Widowed  ?ROS  ?Review of Systems  ?Cardiovascular:  Negative for chest pain, dyspnea on exertion and leg swelling.  ?Neurological:  Positive for dizziness.  ?Objective  ?Blood pressure (!) 195/77, pulse 63, temperature 98.7 ?F (37.1 ?C), temperature source Temporal, resp. rate 16, height 5\' 2"  (1.575 m), weight 140 lb (63.5 kg), SpO2 97 %. Body mass index is 25.61 kg/m?.  ? ?  10/09/2021  ? 11:05 AM 10/09/2021  ? 10:48 AM 04/25/2021  ? 10:12 AM  ?Vitals with BMI  ?Height  5\' 2"    ?Weight  140 lbs   ?BMI  25.6   ?Systolic 0000000 [id number]  ?Diastolic 77 90 63  ?Pulse 63 69   ?  ?Physical Exam ?Neck:  ?   Vascular: No JVD.  ?Cardiovascular:  ?   Rate and Rhythm: Normal rate and regular rhythm.  ?    Pulses: Intact distal pulses.  ?   Heart sounds: Normal heart sounds. No murmur heard. ?  No gallop.  ?Pulmonary:  ?   Effort: Pulmonary effort is normal.  ?   Breath sounds: Normal breath sounds.  ?Abdominal:  ?   General: Bowel sounds are normal.  ?   Palpations: Abdomen is soft.  ?Musculoskeletal:  ?   Right lower leg: No edema.  ?   Left lower leg: No edema.  ? ? ?Medications and allergies  ? ?Allergies  ?Allergen Reactions  ? Lactose Nausea Only and Other (See Comments)  ?  GI Upset- Upsets the stomach  ? Lactose Intolerance (Gi) Other (See Comments)  ?  Upsets the stomach  ?  ? ?Medication list after today's encounter  ? ?Current Outpatient Medications:  ?  acetaminophen (TYLENOL) 325 MG tablet, Take 2 tablets (650 mg total) by mouth every 6 (six) hours as needed for mild pain or headache (fever >/= 101)., Disp: , Rfl:  ?  Bioflavonoid Products (VITAMIN C) CHEW, Chew 1 each by mouth daily., Disp: , Rfl:  ?  dorzolamide-timolol (COSOPT) 22.3-6.8 MG/ML ophthalmic solution, 1 drop 2 (two) times daily., Disp: , Rfl:  ?  isosorbide dinitrate (ISORDIL) 30 MG tablet, Take 1  tablet (30 mg total) by mouth 3 (three) times daily., Disp: 90 tablet, Rfl: 2 ?  losartan (COZAAR) 50 MG tablet, Take 50 mg by mouth daily., Disp: , Rfl:  ?  amLODipine (NORVASC) 10 MG tablet, Take 1 tablet (10 mg total) by mouth daily. (Patient not taking: Reported on 10/09/2021), Disp: 30 tablet, Rfl: 0 ?  hydrALAZINE (APRESOLINE) 50 MG tablet, Take 1 tablet (50 mg total) by mouth 3 (three) times daily., Disp: 270 tablet, Rfl: 0 ? ?Laboratory examination:  ? ?Recent Labs  ?  04/21/21 ?1257 04/22/21 ?0614 04/23/21 ?0532  ?NA 141 141 140  ?K 4.3 3.6 3.6  ?CL 109 107 109  ?CO2 23 26 24   ?GLUCOSE 124* 102* 103*  ?BUN 39* 38* 29*  ?CREATININE 2.02* 1.83* 1.73*  ?CALCIUM 8.7* 8.9 8.7*  ?GFRNONAA 22* 25* 26*  ? ?CrCl cannot be calculated (Patient's most recent lab result is older than the maximum 21 days allowed.).  ? ?  Latest Ref Rng & Units 04/23/2021   ?  5:32 AM 04/22/2021  ?  6:14 AM 04/21/2021  ? 12:57 PM  ?CMP  ?Glucose 70 - 99 mg/dL 103   102   124    ?BUN 8 - 23 mg/dL 29   38   39    ?Creatinine 0.44 - 1.00 mg/dL 1.73   1.83   2.02    ?Sodium 135 - 145 mmol/L 140   141   141    ?Potassium 3.5 - 5.1 mmol/L 3.6   3.6   4.3    ?Chloride 98 - 111 mmol/L 109   107   109    ?CO2 22 - 32 mmol/L 24   26   23     ?Calcium 8.9 - 10.3 mg/dL 8.7   8.9   8.7    ?Total Protein 6.5 - 8.1 g/dL   6.7    ?Total Bilirubin 0.3 - 1.2 mg/dL   0.6    ?Alkaline Phos 38 - 126 U/L   45    ?AST 15 - 41 U/L   17    ?ALT 0 - 44 U/L   11    ? ? ?  Latest Ref Rng & Units 04/25/2021  ?  4:52 AM 04/24/2021  ?  5:03 AM 04/23/2021  ?  5:32 AM  ?CBC  ?WBC 4.0 - 10.5 K/uL 11.6   13.2   11.6    ?Hemoglobin 12.0 - 15.0 g/dL 8.9   9.3   8.8    ?Hematocrit 36.0 - 46.0 % 26.8   27.8   26.6    ?Platelets 150 - 400 K/uL 312   320   275    ? ?Radiology:  ? ?Cardiac Studies:  ? ?NA ? ?EKG:  ? ?EKG 10/09/2021: Normal sinus rhythm at the rate of 61 bpm, poor R wave progression, cannot exclude anteroseptal infarct old.  No evidence of ischemia.  Normal QT interval.   ? ?Assessment  ? ?  ICD-10-CM   ?1. Primary hypertension  I10 EKG 12-Lead  ?  isosorbide dinitrate (ISORDIL) 30 MG tablet  ?  hydrALAZINE (APRESOLINE) 50 MG tablet  ?  ?2. Dizziness and giddiness  R42   ?  ?3. Stage 3b chronic kidney disease (HCC)  N18.32   ?  ?  ? ?Medications Discontinued During This Encounter  ?Medication Reason  ? levothyroxine (SYNTHROID) 100 MCG tablet Dose change  ? guaiFENesin-dextromethorphan (ROBITUSSIN DM) 100-10 MG/5ML syrup   ? pravastatin (PRAVACHOL) 20 MG tablet   ?  meclizine (ANTIVERT) 25 MG tablet   ? latanoprost (XALATAN) 0.005 % ophthalmic solution   ? hydrALAZINE (APRESOLINE) 25 MG tablet Reorder  ?  ?Meds ordered this encounter  ?Medications  ? isosorbide dinitrate (ISORDIL) 30 MG tablet  ?  Sig: Take 1 tablet (30 mg total) by mouth 3 (three) times daily.  ?  Dispense:  90 tablet  ?  Refill:  2  ?  hydrALAZINE (APRESOLINE) 50 MG tablet  ?  Sig: Take 1 tablet (50 mg total) by mouth 3 (three) times daily.  ?  Dispense:  270 tablet  ?  Refill:  0  ? ?Orders Placed This Encounter  ?Procedures  ? EKG 12-Lead  ? ?Recommendations:  ? ?Stacy Huang is a 88 y.o.  African-American female patient with hypertension, hyperlipidemia, chronic stage IIIb kidney disease, hypothyroidism is referred to me for management of hypertension. ? ?She also presented on 08/28/2021 with headache and dizziness due to uncontrolled hypertension ongoing for the past 2 weeks to the emergency room.  CT scan of the head did not reveal any acute abnormality, she was treated for hypertension discharged home.  She has had at least 2 or hospital admissions in the past 6 months. ? ?She has not been taking amlodipine.  Advised her not to take the medication, to bring the medications to the office visit on her next visit in 4 weeks.  For now I have increased the dose of hydralazine from 25 mg to 50 mg 3 times a day and added isosorbide dinitrate 30 mg 3 times a day.  She will also take extra dose of the same combination if systolic blood pressure [id number] mmHg. ? ?No indication for going to the emergency room she has chronic dizziness which is unrelated to hypertension, suspect wax in her ears and probably vestibular issued related to age.  I would be very conservative in managing her hypertension as she is 88 years of age with chronic hypertension. ? ?Reviewed labs, stage III with chronic kidney disease remained stable.  Office visit in 4 weeks. ? ? ?Adrian Prows, MD, [facility] ?10/09/2021, 11:32 AM ?Office: [phone] ?

## 2021-10-09 NOTE — Patient Instructions (Signed)
If the blood pressure is elevated >160/90 mmHg, due to the following below ? ?Take hydralazine 25 mg 2 tablets plus isosorbide dinitrate 30 mg 1 tablet as needed up to 3 times extra dose. ?

## 2021-11-08 ENCOUNTER — Telehealth: Payer: Self-pay

## 2021-11-08 ENCOUNTER — Other Ambulatory Visit: Payer: Self-pay

## 2021-11-08 DIAGNOSIS — I10 Essential (primary) hypertension: Secondary | ICD-10-CM

## 2021-11-08 MED ORDER — HYDRALAZINE HCL 50 MG PO TABS: 50.0000 mg | ORAL_TABLET | Freq: Three times a day (TID) | ORAL | 3 refills | Status: DC

## 2021-11-08 NOTE — Telephone Encounter (Signed)
Pt son called to inform us that pt bp has been high for the past 3 days. Pt bp this morning was 216/90 pulse of 69. Pt denise cp, has some SOB with headaches. Pt mention she was also dizziness. Pt mention she has not taken the hydralazine 50mg   for a few days due to not having any refills. Nor has she been taking the and the Isosobide 30mg . Please advise.

## 2021-11-08 NOTE — Telephone Encounter (Signed)
Can you pleaswe refilll both of them, I am seeing her next week

## 2021-11-15 ENCOUNTER — Encounter: Payer: Self-pay | Admitting: Cardiology

## 2021-11-15 ENCOUNTER — Ambulatory Visit: Payer: Medicare Other | Admitting: Cardiology

## 2021-11-15 VITALS — BP 210/87 | HR 68 | Temp 98.9°F | Resp 16 | Ht 62.0 in | Wt 138.0 lb

## 2021-11-15 DIAGNOSIS — I10 Essential (primary) hypertension: Secondary | ICD-10-CM

## 2021-11-15 DIAGNOSIS — R9431 Abnormal electrocardiogram [ECG] [EKG]: Secondary | ICD-10-CM

## 2021-11-15 DIAGNOSIS — N1832 Chronic kidney disease, stage 3b: Secondary | ICD-10-CM

## 2021-11-15 MED ORDER — LOSARTAN POTASSIUM-HCTZ 50-12.5 MG PO TABS: 1.0000 | ORAL_TABLET | Freq: Every morning | ORAL | 2 refills | Status: DC

## 2021-11-15 MED ORDER — AMLODIPINE BESYLATE 10 MG PO TABS: 10.0000 mg | ORAL_TABLET | ORAL | 1 refills | Status: DC

## 2021-11-15 NOTE — Progress Notes (Signed)
Primary Physician/Referring:  Stephani Police, FNP  Patient ID: Stacy Huang, female    DOB: [birth year], 88 y.o.   MRN: [id number]  Chief Complaint  Patient presents with   Hypertension    Check orthostatics   HPI:    Stacy Huang  is a 88 y.o.  African-American female patient with hypertension, hyperlipidemia, chronic stage IIIb kidney disease, hypothyroidism presents for management of hypertension. I had seen her 4 weeks ago and reconciled her medications.   She has chronic dizziness, occasional episodes of headache, she is mostly being managed at [facility], her last emergency room visit on 08/28/2021, also she has had admission with hypertensive crisis on 01/23/2021 and the blood pressure was 249/73 mmHg with dizziness and headache.  Except for chronic dizziness, she has no specific complaints.  She brings her home medications, only has isosorbide dinitrate and hydralazine.  She is accompanied by her son.  Past Medical History:  Diagnosis Date   Hypertension    Shoulder fracture, right    Thyroid disease    History reviewed. No pertinent surgical history. Family History  Problem Relation Age of Onset   Alzheimer's disease Mother    Hypertension Father    Other Sister        Died at birth   Cancer Brother        spinal   Heart attack Brother     Social History   Tobacco Use   Smoking status: Never   Smokeless tobacco: Never  Substance Use Topics   Alcohol use: Never   Marital Status: Widowed  ROS  Review of Systems  Cardiovascular:  Negative for chest pain, dyspnea on exertion and leg swelling.  Neurological:  Positive for dizziness.  Objective  Blood pressure (!) 210/87, pulse 68, temperature 98.9 F (37.2 C), temperature source Temporal, resp. rate 16, height 5\' 2"  (1.575 m), weight 138 lb (62.6 kg), SpO2 95 %. Body mass index is 25.24 kg/m.     11/15/2021   11:23 AM 10/09/2021   11:05 AM 10/09/2021   10:48 AM  Vitals with BMI  Height 5\' 2"   5\' 2"    Weight 138 lbs  140 lbs  BMI [id number]  0000000  Systolic A999333 0000000 [id number]  Diastolic 87 77 90  Pulse 68 63 69    Physical Exam Neck:     Vascular: No JVD.  Cardiovascular:     Rate and Rhythm: Normal rate and regular rhythm.     Pulses: Intact distal pulses.     Heart sounds: Normal heart sounds. No murmur heard.   No gallop.  Pulmonary:     Effort: Pulmonary effort is normal.     Breath sounds: Normal breath sounds.  Abdominal:     General: Bowel sounds are normal.     Palpations: Abdomen is soft.  Musculoskeletal:     Right lower leg: No edema.     Left lower leg: No edema.    Medications and allergies   Allergies  Allergen Reactions   Lactose Nausea Only and Other (See Comments)    GI Upset- Upsets the stomach   Lactose Intolerance (Gi) Other (See Comments)    Upsets the stomach     Medication list after today's encounter   Current Outpatient Medications:    acetaminophen (TYLENOL) 325 MG tablet, Take 2 tablets (650 mg total) by mouth every 6 (six) hours as needed for mild pain or headache (fever >/= 101)., Disp: , Rfl:    Bioflavonoid Products (  VITAMIN C) CHEW, Chew 1 each by mouth daily., Disp: , Rfl:    dorzolamide-timolol (COSOPT) 22.3-6.8 MG/ML ophthalmic solution, 1 drop 2 (two) times daily., Disp: , Rfl:    hydrALAZINE (APRESOLINE) 50 MG tablet, Take 1 tablet (50 mg total) by mouth 3 (three) times daily., Disp: 270 tablet, Rfl: 3   isosorbide dinitrate (ISORDIL) 30 MG tablet, Take 1 tablet (30 mg total) by mouth 3 (three) times daily., Disp: 90 tablet, Rfl: 2   amLODipine (NORVASC) 10 MG tablet, Take 1 tablet (10 mg total) by mouth every morning., Disp: 90 tablet, Rfl: 1   losartan-hydrochlorothiazide (HYZAAR) 50-12.5 MG tablet, Take 1 tablet by mouth every morning., Disp: 30 tablet, Rfl: 2  Laboratory examination:   Recent Labs    04/21/21 1257 04/22/21 0614 04/23/21 0532  NA 141 141 140  K 4.3 3.6 3.6  CL 109 107 109  CO2 23 26 24   GLUCOSE 124* 102* 103*   BUN 39* 38* 29*  CREATININE 2.02* 1.83* 1.73*  CALCIUM 8.7* 8.9 8.7*  GFRNONAA 22* 25* 26*   CrCl cannot be calculated (Patient's most recent lab result is older than the maximum 21 days allowed.).     Latest Ref Rng & Units 04/23/2021    5:32 AM 04/22/2021    6:14 AM 04/21/2021   12:57 PM  CMP  Glucose 70 - 99 mg/dL 103   102   124    BUN 8 - 23 mg/dL 29   38   39    Creatinine 0.44 - 1.00 mg/dL 1.73   1.83   2.02    Sodium 135 - 145 mmol/L 140   141   141    Potassium 3.5 - 5.1 mmol/L 3.6   3.6   4.3    Chloride 98 - 111 mmol/L 109   107   109    CO2 22 - 32 mmol/L 24   26   23     Calcium 8.9 - 10.3 mg/dL 8.7   8.9   8.7    Total Protein 6.5 - 8.1 g/dL   6.7    Total Bilirubin 0.3 - 1.2 mg/dL   0.6    Alkaline Phos 38 - 126 U/L   45    AST 15 - 41 U/L   17    ALT 0 - 44 U/L   11        Latest Ref Rng & Units 04/25/2021    4:52 AM 04/24/2021    5:03 AM 04/23/2021    5:32 AM  CBC  WBC 4.0 - 10.5 K/uL 11.6   13.2   11.6    Hemoglobin 12.0 - 15.0 g/dL 8.9   9.3   8.8    Hematocrit 36.0 - 46.0 % 26.8   27.8   26.6    Platelets 150 - 400 K/uL 312   320   275     Radiology:   Cardiac Studies:   NA  EKG:   EKG 10/09/2021: Normal sinus rhythm at the rate of 61 bpm, poor R wave progression, cannot exclude anteroseptal infarct old.  No evidence of ischemia.  Normal QT interval.    Assessment     ICD-10-CM   1. Primary hypertension  I10 amLODipine (NORVASC) 10 MG tablet    losartan-hydrochlorothiazide (HYZAAR) 50-12.5 MG tablet    PCV ECHOCARDIOGRAM COMPLETE    2. Stage 3b chronic kidney disease (HCC)  N18.32 losartan-hydrochlorothiazide (HYZAAR) 50-12.5 MG tablet    Basic metabolic panel  Basic metabolic panel    3. Nonspecific abnormal electrocardiogram (ECG) (EKG)  R94.31 PCV ECHOCARDIOGRAM COMPLETE       Medications Discontinued During This Encounter  Medication Reason   losartan (COZAAR) 50 MG tablet Patient has not taken in last 30 days   amLODipine  (NORVASC) 10 MG tablet Reorder    Meds ordered this encounter  Medications   amLODipine (NORVASC) 10 MG tablet    Sig: Take 1 tablet (10 mg total) by mouth every morning.    Dispense:  90 tablet    Refill:  1   losartan-hydrochlorothiazide (HYZAAR) 50-12.5 MG tablet    Sig: Take 1 tablet by mouth every morning.    Dispense:  30 tablet    Refill:  2   Orders Placed This Encounter  Procedures   Basic metabolic panel    Standing Status:   Future    Number of Occurrences:   1    Standing Expiration Date:   11/16/2022   PCV ECHOCARDIOGRAM COMPLETE    Standing Status:   Future    Standing Expiration Date:   11/16/2022   Recommendations:   Stacy Huang is a 88 y.o.  African-American female patient with hypertension, hyperlipidemia, chronic stage IIIb kidney disease, hypothyroidism presents for management of hypertension. I had seen her 4 weeks ago and reconciled her medications.   She must have had no refills on Losartan and Amlodipine. She has been compliant with Hydralazine and ISDN. I changed her losartan to Losartan H 50/12.5 mg in the morning and restarted Amlodipine 10 mg in the afternoon and discussed with her son to make arrangements to have the medications in a box for daily use to improve compliance.   We will obtain an echocardiogram in view of markedly elevated blood pressure, dizziness and abnormal EKG.  I discussed regarding diet and advised her to avoid excessive salt. I would like to see her back in 4 weeks and BMP in 2 weeks. Written instructions given. She is asymptomatic with regards to hypertension and suspect chronic uncontrolled hypertension with stage IIIa-b CKD. She is 88 year of age. Would be careful to bring her BP down quickly.       Adrian Prows, MD, [facility] 11/15/2021, 11:41 AM Office: [phone]

## 2021-11-28 LAB — BASIC METABOLIC PANEL
BUN/Creatinine Ratio: 23 (ref 12–28)
BUN: 53 mg/dL — ABNORMAL HIGH (ref 10–36)
CO2: 22 mmol/L (ref 20–29)
Calcium: 9.6 mg/dL (ref 8.7–10.3)
Chloride: 103 mmol/L (ref 96–106)
Creatinine, Ser: 2.27 mg/dL — ABNORMAL HIGH (ref 0.57–1.00)
Glucose: 121 mg/dL — ABNORMAL HIGH (ref 70–99)
Potassium: 4.4 mmol/L (ref 3.5–5.2)
Sodium: 140 mmol/L (ref 134–144)
eGFR: 19 mL/min/{1.73_m2} — ABNORMAL LOW (ref >59)

## 2021-12-01 ENCOUNTER — Other Ambulatory Visit: Payer: Medicare Other

## 2021-12-14 ENCOUNTER — Ambulatory Visit: Payer: Medicare Other | Admitting: Cardiology

## 2021-12-14 ENCOUNTER — Encounter: Payer: Self-pay | Admitting: Cardiology

## 2021-12-14 VITALS — BP 176/67 | HR 72 | Temp 98.0°F | Resp 16 | Ht 62.0 in | Wt 138.0 lb

## 2021-12-14 DIAGNOSIS — R42 Dizziness and giddiness: Secondary | ICD-10-CM

## 2021-12-14 DIAGNOSIS — I10 Essential (primary) hypertension: Secondary | ICD-10-CM

## 2021-12-14 DIAGNOSIS — N1832 Chronic kidney disease, stage 3b: Secondary | ICD-10-CM

## 2021-12-14 MED ORDER — LOSARTAN POTASSIUM-HCTZ 50-12.5 MG PO TABS: 0.5000 | ORAL_TABLET | Freq: Every morning | ORAL | 1 refills | Status: DC

## 2021-12-14 MED ORDER — PINDOLOL 5 MG PO TABS: 5.0000 mg | ORAL_TABLET | Freq: Two times a day (BID) | ORAL | 2 refills | Status: DC

## 2021-12-14 NOTE — Progress Notes (Signed)
Primary Physician/Referring:  Atilano Ina, FNP  Patient ID: Stacy Huang, female    DOB: [birth year], 88 y.o.   MRN: [id number]  Chief Complaint  Patient presents with   Hypertension   Follow-up    4 week   HPI:    Stacy Huang  is a 88 y.o.  African-American female patient with hypertension, hyperlipidemia, chronic stage IIIb kidney disease, hypothyroidism presents for management of hypertension. I had seen her 4 weeks ago and reconciled her medications.  She now brings blood pressure medications to the appointments.  Patient states that she is presently doing well, has not had any more headaches, has noticed her blood pressures are improved significantly since last office visit.  She has chronic dizziness, occasional episodes of headache, she is mostly being managed at [facility], her last emergency room visit on 08/28/2021, also she has had admission with hypertensive crisis on 01/23/2021 and the blood pressure was 249/73 mmHg with dizziness and headache. She is accompanied by her son.  Past Medical History:  Diagnosis Date   Hypertension    Shoulder fracture, right    Thyroid disease    History reviewed. No pertinent surgical history. Family History  Problem Relation Age of Onset   Alzheimer's disease Mother    Hypertension Father    Other Sister        Died at birth   Cancer Brother        spinal   Heart attack Brother     Social History   Tobacco Use   Smoking status: Never   Smokeless tobacco: Never  Substance Use Topics   Alcohol use: Never   Marital Status: Widowed  ROS  Review of Systems  Cardiovascular:  Negative for chest pain, dyspnea on exertion and leg swelling.  Neurological:  Positive for dizziness.   Objective      12/14/2021    9:41 AM 11/15/2021   11:23 AM 10/09/2021   11:05 AM  Vitals with BMI  Height 5\' 2"  5\' 2"    Weight 138 lbs 138 lbs   BMI 25.23 25.23   Systolic 176 210  Diastolic 67 87 77  Pulse 72 68 63    Today's Vitals   12/14/21 0941  BP: (!) 176/67  Pulse: 72  Resp: 16  Temp: 98 F (36.7 C)  SpO2: 99%  Weight: 138 lb (62.6 kg)  Height: 5\' 2"  (1.575 m)   Body mass index is 25.24 kg/m.  Orthostatic VS for the past 72 hrs (Last 3 readings):  Orthostatic BP Patient Position BP Location Cuff Size  12/14/21 1034 170/70 Standing Left Arm --  12/14/21 1033 160/70 Sitting Left Arm --  12/14/21 0941 -- Sitting Left Arm Normal    Physical Exam Neck:     Vascular: No JVD.  Cardiovascular:     Rate and Rhythm: Normal rate and regular rhythm.     Pulses: Intact distal pulses.     Heart sounds: Normal heart sounds. No murmur heard.    No gallop.  Pulmonary:     Effort: Pulmonary effort is normal.     Breath sounds: Normal breath sounds.  Abdominal:     General: Bowel sounds are normal.     Palpations: Abdomen is soft.  Musculoskeletal:     Right lower leg: No edema.     Left lower leg: No edema.     Medications and allergies   Allergies  Allergen Reactions   Lactose Nausea Only and Other (See  Comments)    GI Upset- Upsets the stomach   Lactose Intolerance (Gi) Other (See Comments)    Upsets the stomach     Medication list after today's encounter   Current Outpatient Medications:    acetaminophen (TYLENOL) 325 MG tablet, Take 2 tablets (650 mg total) by mouth every 6 (six) hours as needed for mild pain or headache (fever >/= 101)., Disp: , Rfl:    amLODipine (NORVASC) 10 MG tablet, Take 1 tablet (10 mg total) by mouth every morning., Disp: 90 tablet, Rfl: 1   Bioflavonoid Products (VITAMIN C) CHEW, Chew 1 each by mouth daily., Disp: , Rfl:    dorzolamide-timolol (COSOPT) 22.3-6.8 MG/ML ophthalmic solution, 1 drop 2 (two) times daily., Disp: , Rfl:    hydrALAZINE (APRESOLINE) 50 MG tablet, Take 1 tablet (50 mg total) by mouth 3 (three) times daily., Disp: 270 tablet, Rfl: 3   isosorbide dinitrate (ISORDIL) 30 MG tablet, Take 1 tablet (30 mg total) by mouth 3 (three)  times daily., Disp: 90 tablet, Rfl: 2   pindolol (VISKEN) 5 MG tablet, Take 1 tablet (5 mg total) by mouth 2 (two) times daily., Disp: 60 tablet, Rfl: 2   losartan-hydrochlorothiazide (HYZAAR) 50-12.5 MG tablet, Take 0.5 tablets by mouth every morning., Disp: 90 tablet, Rfl: 1  Laboratory examination:   Recent Labs    04/21/21 1257 04/22/21 0614 04/23/21 0532 11/27/21 1004  NA 141 141 140 140  K 4.3 3.6 3.6 4.4  CL 109 107 109 103  CO2 23 26 24 22   GLUCOSE 124* 102* 103* 121*  BUN 39* 38* 29* 53*  CREATININE 2.02* 1.83* 1.73* 2.27*  CALCIUM 8.7* 8.9 8.7* 9.6  GFRNONAA 22* 25* 26*  --    estimated creatinine clearance is 12 mL/min (A) (by C-G formula based on SCr of 2.27 mg/dL (H)).     Latest Ref Rng & Units 11/27/2021   10:04 AM 04/23/2021    5:32 AM 04/22/2021    6:14 AM  CMP  Glucose 70 - 99 mg/dL 13/05/2021  440  347   BUN 10 - 36 mg/dL 53  29  38   Creatinine 0.57 - 1.00 mg/dL 425  9.56  3.87   Sodium 134 - 144 mmol/L 140  140  141   Potassium 3.5 - 5.2 mmol/L 4.4  3.6  3.6   Chloride 96 - 106 mmol/L 103  109  107   CO2 20 - 29 mmol/L 22  24  26    Calcium 8.7 - 10.3 mg/dL 9.6  8.7  8.9       Latest Ref Rng & Units 04/25/2021    4:52 AM 04/24/2021    5:03 AM 04/23/2021    5:32 AM  CBC  WBC 4.0 - 10.5 K/uL 11.6  13.2  11.6   Hemoglobin 12.0 - 15.0 g/dL 8.9  9.3  8.8   Hematocrit 36.0 - 46.0 % 26.8  27.8  26.6   Platelets 150 - 400 K/uL 312  320  275    Radiology:   Cardiac Studies:    EKG:   EKG 10/09/2021: Normal sinus rhythm at the rate of 61 bpm, poor R wave progression, cannot exclude anteroseptal infarct old.  No evidence of ischemia.  Normal QT interval.    Assessment     ICD-10-CM   1. Primary hypertension  I10 losartan-hydrochlorothiazide (HYZAAR) 50-12.5 MG tablet    pindolol (VISKEN) 5 MG tablet    Basic metabolic panel    Basic metabolic panel  2. Stage 3b chronic kidney disease (HCC)  N18.32 losartan-hydrochlorothiazide (HYZAAR) 50-12.5 MG  tablet    3. Dizziness and giddiness  R42        Medications Discontinued During This Encounter  Medication Reason   losartan-hydrochlorothiazide (HYZAAR) 50-12.5 MG tablet     Orders Placed This Encounter  Procedures   Basic metabolic panel    Do not draw labs until required date    Standing Status:   Future    Number of Occurrences:   1    Standing Expiration Date:   12/15/2022    Meds ordered this encounter  Medications   losartan-hydrochlorothiazide (HYZAAR) 50-12.5 MG tablet    Sig: Take 0.5 tablets by mouth every morning.    Dispense:  90 tablet    Refill:  1   pindolol (VISKEN) 5 MG tablet    Sig: Take 1 tablet (5 mg total) by mouth 2 (two) times daily.    Dispense:  60 tablet    Refill:  2     Recommendations:   KSENIA COBAS is a 88 y.o.  African-American female patient with hypertension, hyperlipidemia, chronic stage IIIb kidney disease, hypothyroidism presents for management of hypertension. I had seen her 4 weeks ago and reconciled her medications.  She now brings blood pressure medications to the appointments.  Renal function slightly worsened as she had restarted losartan HCT, we will reduce the dose from 50/12.5 mg to 1/2 tablet daily.  Blood pressure is much improved however not at goal.  She is not orthostatic and a chronic dizziness is probably not related to orthostasis.  I will add pindolol 5 mg p.o. twice daily for hypertension, in view of heart rate being 60 bpm, do not want to use a beta-blocker without intrinsic sympathomimetic activity.  I would like to see her back again in 6 weeks with repeat EKG.  I will also repeat BMP 1 month from now.  Echocardiogram has been ordered but she missed the appointment, son is present and I have reiterated the importance of follow-ups.  She appears to be compliant.  I would like to follow-up closely until her blood pressure is well controlled and she remains asymptomatic.    Adrian Prows, MD, [facility] 12/14/2021, 10:44  AM Office: [phone]

## 2021-12-26 LAB — BASIC METABOLIC PANEL
BUN/Creatinine Ratio: 18 (ref 12–28)
BUN: 39 mg/dL — ABNORMAL HIGH (ref 10–36)
CO2: 21 mmol/L (ref 20–29)
Calcium: 9.4 mg/dL (ref 8.7–10.3)
Chloride: 105 mmol/L (ref 96–106)
Creatinine, Ser: 2.19 mg/dL — ABNORMAL HIGH (ref 0.57–1.00)
Glucose: 105 mg/dL — ABNORMAL HIGH (ref 70–99)
Potassium: 4.3 mmol/L (ref 3.5–5.2)
Sodium: 142 mmol/L (ref 134–144)
eGFR: 20 mL/min/{1.73_m2} — ABNORMAL LOW (ref >59)

## 2021-12-26 NOTE — Progress Notes (Signed)
Stable renal function.

## 2022-01-12 ENCOUNTER — Other Ambulatory Visit: Payer: Self-pay

## 2022-01-12 DIAGNOSIS — I10 Essential (primary) hypertension: Secondary | ICD-10-CM

## 2022-01-12 MED ORDER — ISOSORBIDE DINITRATE 30 MG PO TABS: 30.0000 mg | ORAL_TABLET | Freq: Three times a day (TID) | ORAL | 2 refills | Status: DC

## 2022-01-19 IMAGING — CT CT HEAD W/O CM
4 series · 16 of 47 positions shown, 18 images · non-contrast
Comparison: None.

CLINICAL DATA: Neuro deficit, headache, dizziness since [REDACTED]

EXAM:
CT HEAD WITHOUT CONTRAST
TECHNIQUE: Contiguous axial images were obtained from the base of the skull
through the vertex without intravenous contrast.

[Series 2: head wo · axial · 0.41mm/px · z∈[-193,-88]mm · 7 of 29 slices shown, 9 images]
[im 4/29  brain]
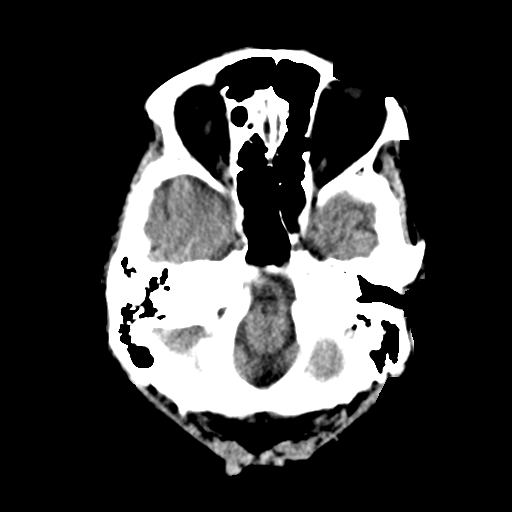
[im 4/29  bone]
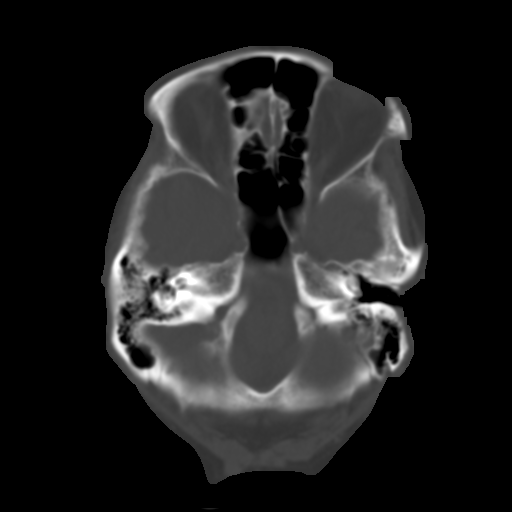
[im 8/29  brain]
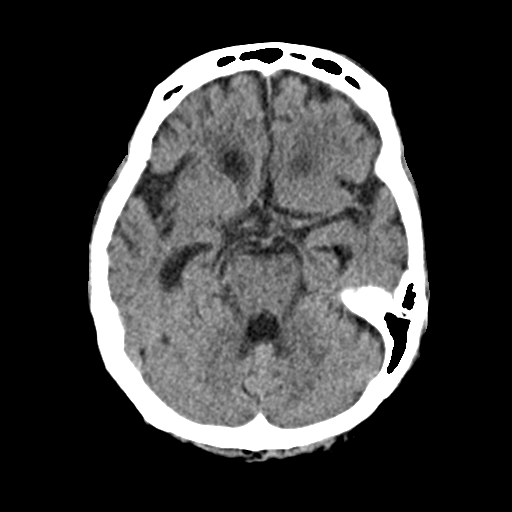
[im 11/29  brain]
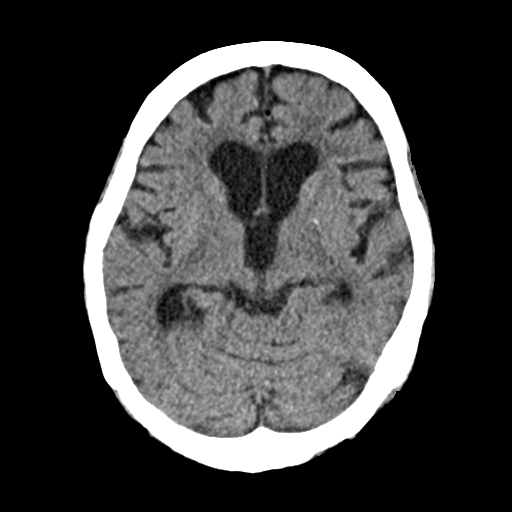
[im 15/29  brain]
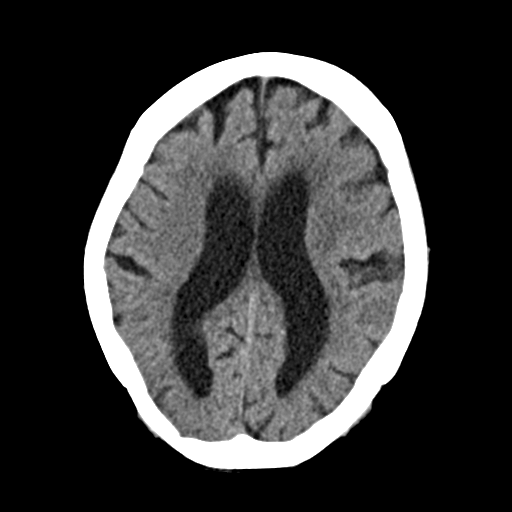
[im 18/29  brain]
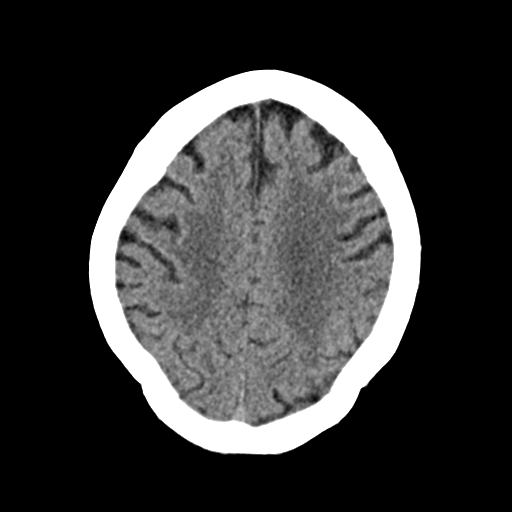
[im 18/29  bone]
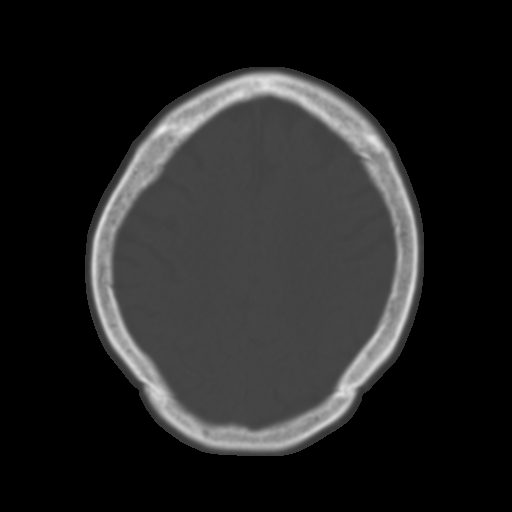
[im 22/29  brain]
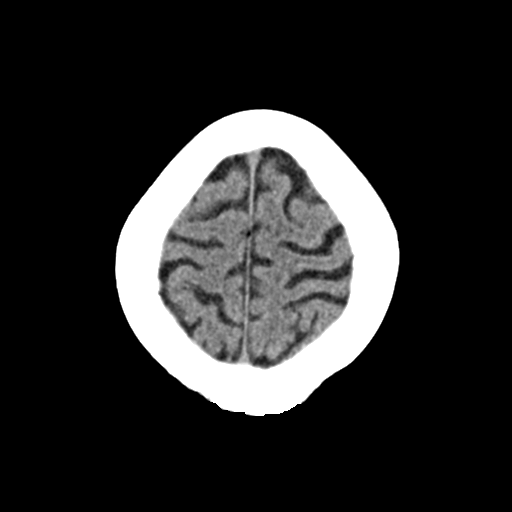
[im 25/29  brain]
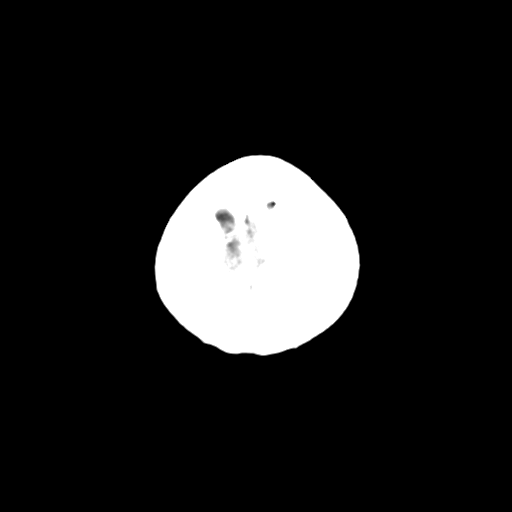

[Series 3: head bone · axial · 0.41mm/px · z∈[-194,-166]mm · 3 of 73 slices shown]
[im 8/73  bone]
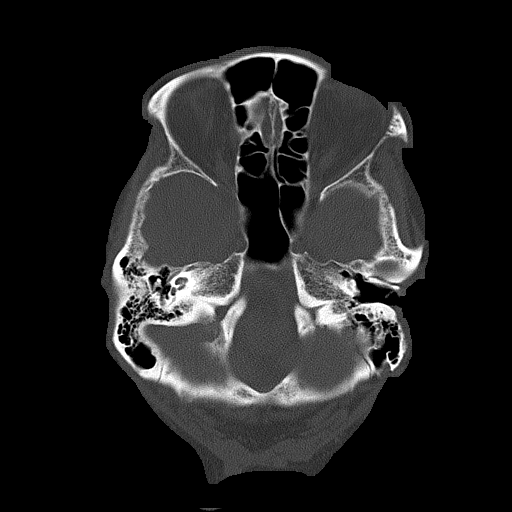
[im 15/73  bone]
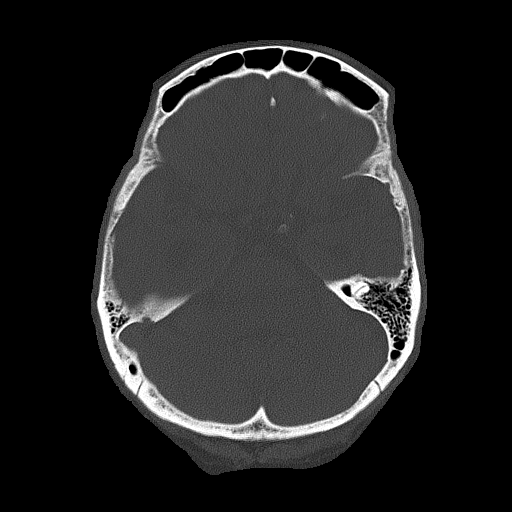
[im 22/73  bone]
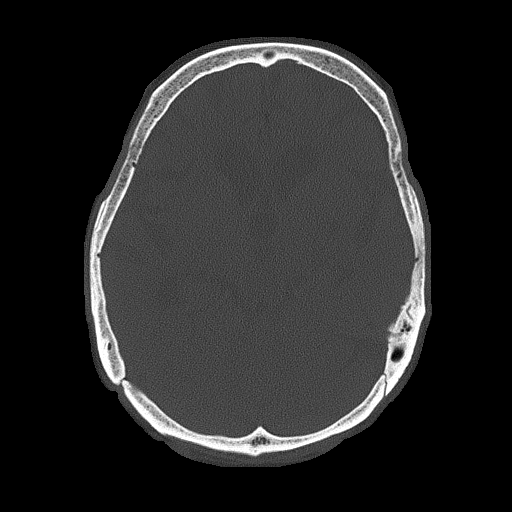

[Series 4: coronal soft · coronal · 0.29mm/px · 3 of 64 slices shown]
[im 22/64  brain]
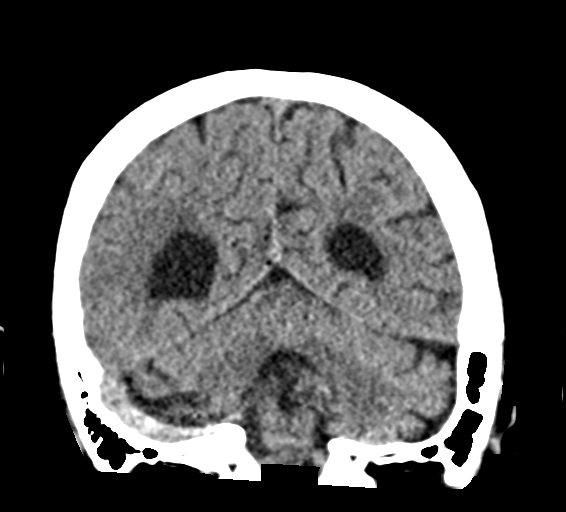
[im 29/64  brain]
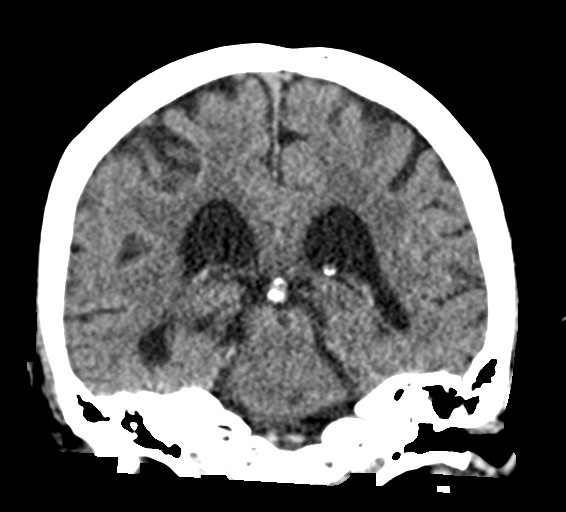
[im 36/64  brain]
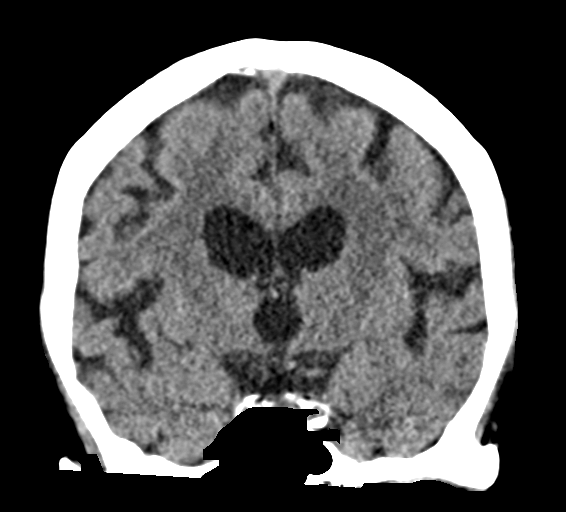

[Series 5: sag soft · sagittal · 0.29mm/px · 3 of 52 slices shown]
[im 18/52  brain]
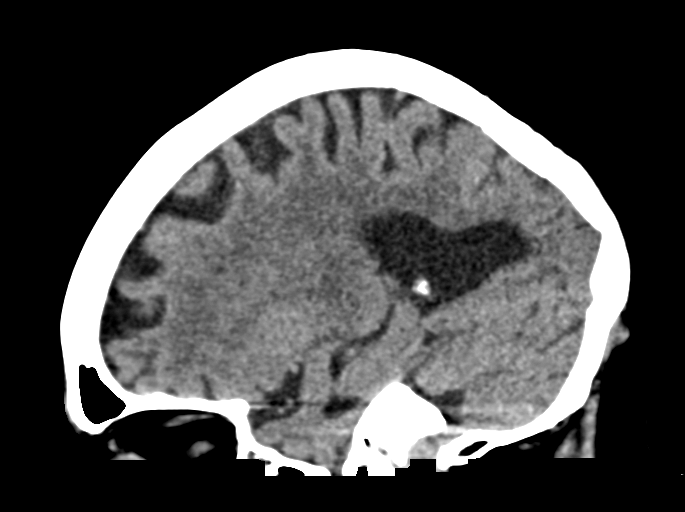
[im 26/52  brain]
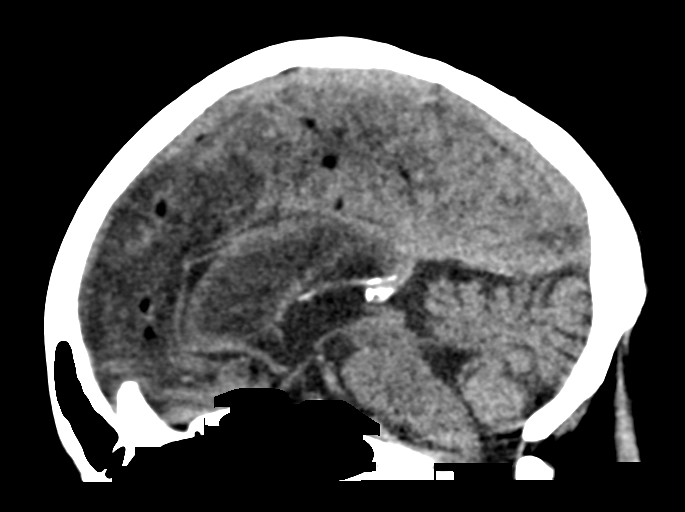
[im 35/52  brain]
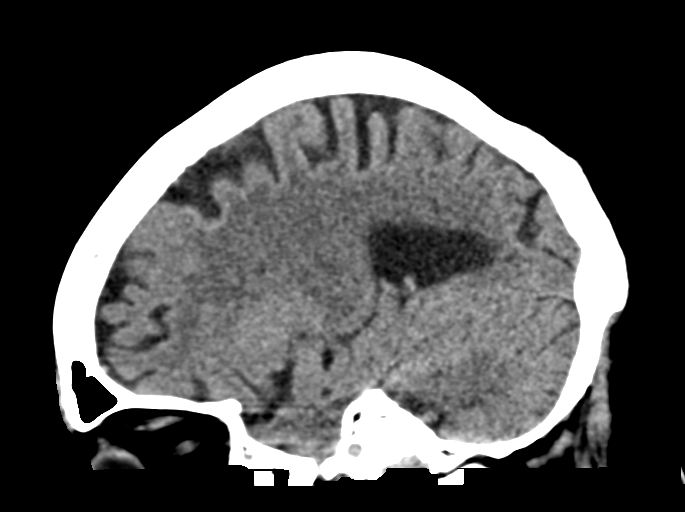

[16 of 47 positions shown; findings below may reference images not displayed]

FINDINGS: Brain: There is no acute intracranial hemorrhage, extra-axial fluid
collection, or infarct.

There is moderate parenchymal volume loss with ex vacuo dilatation
of the ventricular system. There is right worse than left
hippocampal atrophy foci of hypodensity in the subcortical and
periventricular white matter nonspecific but likely reflect sequela
of chronic white matter microangiopathy. There is a remote lacunar
infarct in the left basal ganglia. There is no mass lesion. There is
no midline shift.

Vascular: There is calcification of the bilateral cavernous ICAs.

Skull: Normal. Negative for fracture or focal lesion.

Sinuses/Orbits: The imaged paranasal sinuses are clear. The imaged
globes and orbits are unremarkable.

Other: None.
IMPRESSION: No acute intracranial pathology.

## 2022-01-24 ENCOUNTER — Other Ambulatory Visit: Payer: Medicare Other

## 2022-01-31 ENCOUNTER — Encounter: Payer: Self-pay | Admitting: Cardiology

## 2022-01-31 ENCOUNTER — Ambulatory Visit: Payer: Medicare Other | Admitting: Cardiology

## 2022-01-31 VITALS — BP 174/65 | HR 70 | Temp 98.0°F | Resp 16 | Ht 62.0 in | Wt 136.0 lb

## 2022-01-31 DIAGNOSIS — I10 Essential (primary) hypertension: Secondary | ICD-10-CM

## 2022-01-31 DIAGNOSIS — R9431 Abnormal electrocardiogram [ECG] [EKG]: Secondary | ICD-10-CM

## 2022-01-31 DIAGNOSIS — N1832 Chronic kidney disease, stage 3b: Secondary | ICD-10-CM

## 2022-01-31 NOTE — Progress Notes (Unsigned)
Primary Physician/Referring:  Atilano Ina, FNP  Patient ID: [name], female    DOB: 05-10-1921, 88 y.o.   MRN: [id number]  No chief complaint on file.  HPI:    MARIYANA FULOP  is a 88 y.o.  African-American female patient with hypertension, hyperlipidemia, chronic stage IIIb kidney disease, hypothyroidism presents for management of hypertension. I had seen her 4 weeks ago and reconciled her medications.  She now brings blood pressure medications to the appointments.  Patient states that she is presently doing well, has not had any more headaches, has noticed her blood pressures are improved significantly since last office visit.  She has chronic dizziness, occasional episodes of headache, she is mostly being managed at [facility], her last emergency room visit on 08/28/2021, also she has had admission with hypertensive crisis on 01/23/2021 and the blood pressure was 249/73 mmHg with dizziness and headache. She is accompanied by her son.  Past Medical History:  Diagnosis Date   Hypertension    Shoulder fracture, right    Thyroid disease    History reviewed. No pertinent surgical history. Family History  Problem Relation Age of Onset   Alzheimer's disease Mother    Hypertension Father    Other Sister        Died at birth   Cancer Brother        spinal   Heart attack Brother     Social History   Tobacco Use   Smoking status: Never   Smokeless tobacco: Never  Substance Use Topics   Alcohol use: Never   Marital Status: Widowed  ROS  Review of Systems  Cardiovascular:  Negative for chest pain, dyspnea on exertion and leg swelling.  Neurological:  Positive for dizziness.   Objective      01/31/2022   10:06 AM 01/31/2022   10:02 AM 12/14/2021    9:41 AM  Vitals with BMI  Height  5\' 2"  5\' 2"   Weight  136 lbs 138 lbs  BMI  24.87 25.23  Systolic 174 182  Diastolic 65 68 67  Pulse 70 67 72   Today's Vitals   01/31/22 1002 01/31/22 1006  BP: (!)  182/68 (!) 174/65  Pulse: 67 70  Resp: 16   Temp: 98 F (36.7 C)   TempSrc: Temporal   SpO2: 100% 99%  Weight: 136 lb (61.7 kg)   Height: 5\' 2"  (1.575 m)    Body mass index is 24.87 kg/m.  Orthostatic VS for the past 72 hrs (Last 3 readings):  Patient Position BP Location Cuff Size  01/31/22 1006 Sitting Left Arm Large  01/31/22 1002 Sitting Left Arm Normal   Physical Exam Neck:     Vascular: No JVD.  Cardiovascular:     Rate and Rhythm: Normal rate and regular rhythm.     Pulses: Intact distal pulses.     Heart sounds: Normal heart sounds. No murmur heard.    No gallop.  Pulmonary:     Effort: Pulmonary effort is normal.     Breath sounds: Normal breath sounds.  Abdominal:     General: Bowel sounds are normal.     Palpations: Abdomen is soft.  Musculoskeletal:     Right lower leg: No edema.     Left lower leg: No edema.    Medications and allergies   Allergies  Allergen Reactions   Lactose Nausea Only and Other (See Comments)    GI Upset- Upsets the stomach   Lactose Intolerance (  Gi) Other (See Comments)    Upsets the stomach    Medication list after today's encounter   Current Outpatient Medications:    acetaminophen (TYLENOL) 325 MG tablet, Take 2 tablets (650 mg total) by mouth every 6 (six) hours as needed for mild pain or headache (fever >/= 101)., Disp: , Rfl:    amLODipine (NORVASC) 10 MG tablet, Take 1 tablet (10 mg total) by mouth every morning., Disp: 90 tablet, Rfl: 1   Bioflavonoid Products (VITAMIN C) CHEW, Chew 1 each by mouth daily., Disp: , Rfl:    dorzolamide-timolol (COSOPT) 22.3-6.8 MG/ML ophthalmic solution, 1 drop 2 (two) times daily., Disp: , Rfl:    hydrALAZINE (APRESOLINE) 50 MG tablet, Take 1 tablet (50 mg total) by mouth 3 (three) times daily., Disp: 270 tablet, Rfl: 3   isosorbide dinitrate (ISORDIL) 30 MG tablet, Take 1 tablet (30 mg total) by mouth 3 (three) times daily., Disp: 90 tablet, Rfl: 2   losartan-hydrochlorothiazide  (HYZAAR) 50-12.5 MG tablet, Take 0.5 tablets by mouth every morning., Disp: 90 tablet, Rfl: 1   pindolol (VISKEN) 5 MG tablet, Take 1 tablet (5 mg total) by mouth 2 (two) times daily., Disp: 60 tablet, Rfl: 2  Laboratory examination:   Recent Labs    04/21/21 1257 04/22/21 0614 04/23/21 0532 11/27/21 1004 12/25/21 0923  NA 141 141 140 140 142  K 4.3 3.6 3.6 4.4 4.3  CL 109 107 109 103 105  CO2 23 26 24 22 21   GLUCOSE 124* 102* 103* 121* 105*  BUN 39* 38* 29* 53* 39*  CREATININE 2.02* 1.83* 1.73* 2.27* 2.19*  CALCIUM 8.7* 8.9 8.7* 9.6 9.4  GFRNONAA 22* 25* 26*  --   --    CrCl cannot be calculated (Patient's most recent lab result is older than the maximum 21 days allowed.).     Latest Ref Rng & Units 12/25/2021    9:23 AM 11/27/2021   10:04 AM 04/23/2021    5:32 AM  CMP  Glucose 70 - 99 mg/dL 105  121  103   BUN 10 - 36 mg/dL 39  53  29   Creatinine 0.57 - 1.00 mg/dL 2.19  2.27  1.73   Sodium 134 - 144 mmol/L 142  140  140   Potassium 3.5 - 5.2 mmol/L 4.3  4.4  3.6   Chloride 96 - 106 mmol/L 105  103  109   CO2 20 - 29 mmol/L 21  22  24    Calcium 8.7 - 10.3 mg/dL 9.4  9.6  8.7       Latest Ref Rng & Units 04/25/2021    4:52 AM 04/24/2021    5:03 AM 04/23/2021    5:32 AM  CBC  WBC 4.0 - 10.5 K/uL 11.6  13.2  11.6   Hemoglobin 12.0 - 15.0 g/dL 8.9  9.3  8.8   Hematocrit 36.0 - 46.0 % 26.8  27.8  26.6   Platelets 150 - 400 K/uL 312  320  275    Radiology:   Cardiac Studies:      EKG:   EKG 01/31/2022: Normal sinus rhythm at rate of 66 bpm, normal EKG.  No significant change from 10/09/2021.  Assessment     ICD-10-CM   1. Primary hypertension  I10 EKG 12-Lead    2. Stage 3b chronic kidney disease (HCC)  N18.32     3. Nonspecific abnormal electrocardiogram (ECG) (EKG)  R94.31       There are no discontinued medications.  Orders Placed This Encounter  Procedures   EKG 12-Lead   No orders of the defined types were placed in this encounter.    Recommendations:   LAINEE LEHRMAN is a 88 y.o.  African-American female patient with hypertension, hyperlipidemia, chronic stage IIIb kidney disease, hypothyroidism presents for management of hypertension. I had seen her 4 weeks ago and reconciled her medications.  She now brings blood pressure medications to the appointments.  I had prescribed her pindolol on her last office visit which she has not picked up.  We called the pharmacy and confirm this.  Patient's son is present at the bedside, I have given him the list of medication that she is on and how to take them, he will pick up the medication again.  No change in the EKG obviously as she is not on a beta-blocker presently.  Blood pressure is still elevated although improved from baseline since I started seeing her.  She will come back again in 3 weeks with repeat EKG after she starts pindolol.    Yates Decamp, MD, [facility] 01/31/2022, 11:16 AM Office: [phone]

## 2022-02-05 ENCOUNTER — Telehealth: Payer: Self-pay

## 2022-02-05 NOTE — Telephone Encounter (Signed)
Patients son called to request that the medication Pindolol 5mg   gets refilled he states that medication was not called in to pharmacy. Called patients pharmacy at [facility] on [address] in [place] , [place] with [place] at pharmacy she advised me that she would refill prescription. LVM on patients son's phone [facility]) and advised him medication was called in to pharmacy

## 2022-02-20 ENCOUNTER — Other Ambulatory Visit: Payer: Self-pay

## 2022-02-20 DIAGNOSIS — I10 Essential (primary) hypertension: Secondary | ICD-10-CM

## 2022-02-20 MED ORDER — HYDRALAZINE HCL 50 MG PO TABS: 50.0000 mg | ORAL_TABLET | Freq: Three times a day (TID) | ORAL | 3 refills | Status: DC

## 2022-02-20 NOTE — Telephone Encounter (Signed)
Patients' son Gilberto Better called and wanted his mothers prescription for Hydralazine 50 mg refilled but sent to [facility] in [facility] on [address]

## 2022-03-01 ENCOUNTER — Ambulatory Visit: Payer: Medicare Other | Admitting: Cardiology

## 2022-03-26 ENCOUNTER — Encounter: Payer: Self-pay | Admitting: Cardiology

## 2022-03-26 ENCOUNTER — Ambulatory Visit: Payer: Medicare Other | Admitting: Cardiology

## 2022-03-26 VITALS — BP 117/70 | HR 62 | Temp 97.9°F | Resp 16 | Ht 62.0 in | Wt 142.4 lb

## 2022-03-26 DIAGNOSIS — I10 Essential (primary) hypertension: Secondary | ICD-10-CM

## 2022-03-26 MED ORDER — HYDRALAZINE HCL 25 MG PO TABS: 25.0000 mg | ORAL_TABLET | Freq: Three times a day (TID) | ORAL | 3 refills | Status: DC

## 2022-03-26 MED ORDER — PINDOLOL 5 MG PO TABS: 5.0000 mg | ORAL_TABLET | Freq: Two times a day (BID) | ORAL | 3 refills | Status: DC

## 2022-03-26 NOTE — Progress Notes (Signed)
Primary Physician/Referring:  Stephani Police, FNP  Patient ID: Stacy Huang, female    DOB: 13-Apr-1921, 88 y.o.   MRN: [id number]  Chief Complaint  Patient presents with   Hypertension    HPI:    Stacy Huang  is a 88 y.o.  African-American female patient with hypertension, hyperlipidemia, chronic stage IIIb kidney disease, hypothyroidism presents for management of hypertension.   Patient states that she is presently doing well, has not had any more headaches, has noticed her blood pressures are improved significantly since last office visit.  She has now started taking pindolol 5 mg daily.  She brings all her medications to the office.  She is accompanied by her son.  Past Medical History:  Diagnosis Date   Hypertension    Shoulder fracture, right    Thyroid disease    History reviewed. No pertinent surgical history. Family History  Problem Relation Age of Onset   Alzheimer's disease Mother    Hypertension Father    Other Sister        Died at birth   Cancer Brother        spinal   Heart attack Brother     Social History   Tobacco Use   Smoking status: Never   Smokeless tobacco: Never  Substance Use Topics   Alcohol use: Never   Marital Status: Widowed  ROS  Review of Systems  Cardiovascular:  Negative for chest pain, dyspnea on exertion and leg swelling.   Objective      03/26/2022   10:33 AM 03/26/2022   10:30 AM 01/31/2022   10:06 AM  Vitals with BMI  Height  5\' 2"    Weight  142 lbs 6 oz   BMI  [id number]   Systolic [id number]  Diastolic 70 65 65  Pulse 62 64 70   Today's Vitals   03/26/22 1030 03/26/22 1033  BP: (!) 152/65 117/70  Pulse: 64 62  Resp: 16   Temp: 97.9 F (36.6 C)   TempSrc: Temporal   SpO2: 98% 98%  Weight: 142 lb 6.4 oz (64.6 kg)   Height: 5\' 2"  (1.575 m)    Body mass index is 26.05 kg/m.  Orthostatic VS for the past 72 hrs (Last 3 readings):  Patient Position BP Location Cuff Size  03/26/22 1033 Sitting Right  Arm Normal  03/26/22 1030 Sitting Left Arm Normal   Physical Exam Neck:     Vascular: No JVD.  Cardiovascular:     Rate and Rhythm: Normal rate and regular rhythm.     Pulses: Intact distal pulses.     Heart sounds: Normal heart sounds. No murmur heard.    No gallop.  Pulmonary:     Effort: Pulmonary effort is normal.     Breath sounds: Normal breath sounds.  Abdominal:     General: Bowel sounds are normal.     Palpations: Abdomen is soft.  Musculoskeletal:     Right lower leg: No edema.     Left lower leg: No edema.    Medications and allergies   Allergies  Allergen Reactions   Lactose Nausea Only and Other (See Comments)    GI Upset- Upsets the stomach   Lactose Intolerance (Gi) Other (See Comments)    Upsets the stomach    Medication list after today's encounter   Current Outpatient Medications:    acetaminophen (TYLENOL) 325 MG tablet, Take 2 tablets (650 mg total) by mouth every 6 (six) hours as needed  for mild pain or headache (fever >/= 101)., Disp: , Rfl:    amLODipine (NORVASC) 10 MG tablet, Take 1 tablet (10 mg total) by mouth every morning., Disp: 90 tablet, Rfl: 1   Bioflavonoid Products (VITAMIN C) CHEW, Chew 1 each by mouth daily., Disp: , Rfl:    dorzolamide-timolol (COSOPT) 22.3-6.8 MG/ML ophthalmic solution, 1 drop 2 (two) times daily., Disp: , Rfl:    isosorbide dinitrate (ISORDIL) 30 MG tablet, Take 1 tablet (30 mg total) by mouth 3 (three) times daily., Disp: 90 tablet, Rfl: 2   losartan-hydrochlorothiazide (HYZAAR) 50-12.5 MG tablet, Take 0.5 tablets by mouth every morning., Disp: 90 tablet, Rfl: 1   hydrALAZINE (APRESOLINE) 25 MG tablet, Take 1 tablet (25 mg total) by mouth 3 (three) times daily., Disp: 270 tablet, Rfl: 3   pindolol (VISKEN) 5 MG tablet, Take 1 tablet (5 mg total) by mouth 2 (two) times daily., Disp: 180 tablet, Rfl: 3  Laboratory examination:   Recent Labs    04/21/21 1257 04/22/21 0614 04/23/21 0532 11/27/21 1004  12/25/21 0923  NA 141 141 140 140 142  K 4.3 3.6 3.6 4.4 4.3  CL 109 107 109 103 105  CO2 23 26 24 22 21   GLUCOSE 124* 102* 103* 121* 105*  BUN 39* 38* 29* 53* 39*  CREATININE 2.02* 1.83* 1.73* 2.27* 2.19*  CALCIUM 8.7* 8.9 8.7* 9.6 9.4  GFRNONAA 22* 25* 26*  --   --    CrCl cannot be calculated (Patient's most recent lab result is older than the maximum 21 days allowed.).     Latest Ref Rng & Units 12/25/2021    9:23 AM 11/27/2021   10:04 AM 04/23/2021    5:32 AM  CMP  Glucose 70 - 99 mg/dL 105  121  103   BUN 10 - 36 mg/dL 39  53  29   Creatinine 0.57 - 1.00 mg/dL 2.19  2.27  1.73   Sodium 134 - 144 mmol/L 142  140  140   Potassium 3.5 - 5.2 mmol/L 4.3  4.4  3.6   Chloride 96 - 106 mmol/L 105  103  109   CO2 20 - 29 mmol/L 21  22  24    Calcium 8.7 - 10.3 mg/dL 9.4  9.6  8.7       Latest Ref Rng & Units 04/25/2021    4:52 AM 04/24/2021    5:03 AM 04/23/2021    5:32 AM  CBC  WBC 4.0 - 10.5 K/uL 11.6  13.2  11.6   Hemoglobin 12.0 - 15.0 g/dL 8.9  9.3  8.8   Hematocrit 36.0 - 46.0 % 26.8  27.8  26.6   Platelets 150 - 400 K/uL 312  320  275    Radiology:   Cardiac Studies:      EKG:   EKG 03/26/2022: Normal sinus rhythm at rate of 60 bpm, normal axis, no evidence of ischemia, normal EKG.  EKG 01/31/2022: Normal sinus rhythm at rate of 66 bpm, normal EKG.  No significant change from 10/09/2021.  Assessment     ICD-10-CM   1. Primary hypertension  I10 EKG 12-Lead    pindolol (VISKEN) 5 MG tablet    hydrALAZINE (APRESOLINE) 25 MG tablet      Medications Discontinued During This Encounter  Medication Reason   hydrALAZINE (APRESOLINE) 50 MG tablet    pindolol (VISKEN) 5 MG tablet Reorder   hydrALAZINE (APRESOLINE) 25 MG tablet Reorder     Orders Placed This Encounter  Procedures   EKG 12-Lead   Meds ordered this encounter  Medications   pindolol (VISKEN) 5 MG tablet    Sig: Take 1 tablet (5 mg total) by mouth 2 (two) times daily.    Dispense:  180 tablet     Refill:  3   hydrALAZINE (APRESOLINE) 25 MG tablet    Sig: Take 1 tablet (25 mg total) by mouth 3 (three) times daily.    Dispense:  270 tablet    Refill:  3    Recommendations:   Stacy Huang is a 88 y.o.  African-American female patient with hypertension, hyperlipidemia, chronic stage IIIb kidney disease, hypothyroidism presents for management of hypertension.   Since starting pindolol, blood pressure is under excellent control.  Reviewed her EKG, no evidence of any high degree AV block, essentially normal EKG.  Advised her to continue present dose of the medications and not to change them and I will see her back in 6 months for follow-up.   Adrian Prows, MD, [facility] 03/26/2022, 11:49 AM Office: [phone]

## 2022-05-06 ENCOUNTER — Other Ambulatory Visit: Payer: Self-pay | Admitting: Cardiology

## 2022-05-06 DIAGNOSIS — I10 Essential (primary) hypertension: Secondary | ICD-10-CM

## 2022-07-10 ENCOUNTER — Telehealth: Payer: Self-pay | Admitting: Family

## 2022-07-10 ENCOUNTER — Ambulatory Visit: Payer: 59 | Admitting: Family Medicine

## 2022-07-10 NOTE — Telephone Encounter (Signed)
No show 1/30 said she had hesting problems so she could'nt come.

## 2022-07-10 NOTE — Telephone Encounter (Signed)
Pt was a no show for a NP app with Dr Grandville Silos on 07/10/22, I sent a letter.

## 2022-07-12 NOTE — Telephone Encounter (Signed)
1st no show, fee waived, letter sent 
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
## 2022-07-24 ENCOUNTER — Encounter: Payer: Self-pay | Admitting: Family Medicine

## 2022-07-24 ENCOUNTER — Ambulatory Visit (INDEPENDENT_AMBULATORY_CARE_PROVIDER_SITE_OTHER): Payer: 59 | Admitting: Family Medicine

## 2022-07-24 VITALS — BP 174/90 | HR 70 | Temp 97.6°F | Ht 62.0 in | Wt 141.6 lb

## 2022-07-24 DIAGNOSIS — Z7409 Other reduced mobility: Secondary | ICD-10-CM

## 2022-07-24 DIAGNOSIS — I1A0 Resistant hypertension: Secondary | ICD-10-CM | POA: Diagnosis not present

## 2022-07-24 DIAGNOSIS — R42 Dizziness and giddiness: Secondary | ICD-10-CM

## 2022-07-24 DIAGNOSIS — H6123 Impacted cerumen, bilateral: Secondary | ICD-10-CM

## 2022-07-24 DIAGNOSIS — H409 Unspecified glaucoma: Secondary | ICD-10-CM

## 2022-07-24 DIAGNOSIS — N1832 Chronic kidney disease, stage 3b: Secondary | ICD-10-CM

## 2022-07-24 DIAGNOSIS — R54 Age-related physical debility: Secondary | ICD-10-CM

## 2022-07-24 MED ORDER — ACETIC ACID 2 % OT SOLN: 6.0000 [drp] | Freq: Three times a day (TID) | OTIC | 0 refills | Status: AC

## 2022-07-24 NOTE — Progress Notes (Signed)
Assessment/Plan:  Total time spent caring for the patient today was 120 minutes. This includes time spent before the visit reviewing the chart, time spent during the visit, and time spent after the visit on documentation, etc.  Problem List Items Addressed This Visit       Cardiovascular and Mediastinum   Resistant hypertension    Summary: The patient presents with uncontrolled hypertension despite being on multiple medications.  Differential diagnosis: Secondary causes of hypertension (e.g., renal artery stenosis, primary hyperaldosteronism).  Plan:  [facility]er to [facility] to address complex hypertension management. Consider pharmacist-led home blood pressure monitoring program. Review current medications and adherence; adjust as appropriate.      Relevant Orders   Ambulatory referral to [facility]   AMB Referral to Chronic Care Management Services     Nervous and Auditory   Hearing loss of both ears due to cerumen impaction - Primary    Patient suffers from chronic cerumen impaction contributing to hearing loss and fullness sensation in both ears.  Differential diagnosis: Cerumen impaction versus other causes of hearing impairment (e.g., presbycusis, otitis media).  Plan:  Ear irrigation to remove cerumen impaction. Prescribe acetic acid drops to prevent further accumulation of ear wax. ENT referral may be considered for persistent symptoms or complications.      Relevant Orders   Ambulatory referral to ENT     Genitourinary   Stage 3b chronic kidney disease ([place])   Relevant Orders   AMB Referral to Chronic Care Management Services     Other   Dizziness   Relevant Orders   Ambulatory referral to ENT   PT vestibular rehab   Vertigo    Complaints of daily headaches and dizziness, significantly impacting the quality of life and concern for falls.  Differential diagnosis: Benign positional vertigo, vestibular  neuritis, Meniere's disease.  Plan:  Physical therapy referral for vestibular rehabilitation. Monitor and adjust blood pressure to reduce dizziness. Review medications for possible contributions to dizziness and headache symptoms.      Relevant Orders   Ambulatory referral to ENT   PT vestibular rehab   Frail elderly    Patient's physical condition presents a high risk for falls and resultant complications.  Differential diagnosis: Polypharmacy, mobility issues, muscle weakness. Plan:  Implement physical therapy to improve strength and mobility. Connect patient with community support resources as appropriate. Consider a home health referral for further in-home support and assessment.      Relevant Orders   PT vestibular rehab   Difficulty transferring from wheelchair to chair   Relevant Orders   Ambulatory referral to [place]   Glaucoma of right eye    Patient's history suggestive of glaucoma with associated dizziness and dryness, potentially due to glaucoma treatment.  Differential diagnosis: Worsening glaucoma vs. side effects of treatment. Plan:  Ophthalmology referral for follow-up on glaucoma management. Discuss the benefit-risk profile of glaucoma medications contributing to systemic side effects like dizziness.      Relevant Orders   Ambulatory referral to Ophthalmology    Medications Discontinued During This Encounter  Medication Reason   isosorbide dinitrate (ISORDIL) 30 MG tablet    dorzolamide-timolol (COSOPT) 22.3-6.8 MG/ML ophthalmic solution    acetaminophen (TYLENOL) 325 MG tablet    Bioflavonoid Products (VITAMIN C) CHEW       Subjective:  HPI: Encounter date: 07/24/2022  Stacy Huang is a 89 y.o. female who has Acute hypoxemic respiratory failure due to COVID-19 [facility]); Hypertensive urgency; Stage  3b chronic kidney disease ([place]); History of deep vein thrombosis (DVT) of lower extremity; Essential hypertension; Hypothyroidism;  Hyperlipidemia; Compression fracture of L1 vertebra ([place]); Dizziness and giddiness; Hearing loss of both ears due to cerumen impaction; Resistant hypertension; Dizziness; Vertigo; Frail elderly; Difficulty transferring from wheelchair to chair; and Glaucoma of right eye on their problem list..   She  has a past medical history of Hypertension, Shoulder fracture, right, and Thyroid disease..   Patient is here with her son who also is her caregiver.  He also provides elements of the history.  CHIEF COMPLAINT: Patient has concerns about dizziness and headaches associated with blood pressure, ear fullness, and right eye vision blurriness.  HISTORY OF PRESENT ILLNESS:  Dizziness. The patient reports daily headaches and dizziness which seems to be associated with her history of hypertension. Concerns about the risk of falls were expressed.  Vision. The patient reports blurriness in the right eye. Glaucoma has been diagnosed and treated with drops, which she believes may be causing dizziness. Dryness in the eyes was also noted.  Ears. Both ears feel "stopped up," a chronic issue for the patient. She has a history of earwax impaction and previously underwent regular ear irrigation without substantial relief.  Hypertension. The patient has resistant hypertension and is currently on a regimen including amlodipine, hydralazine, losartan-hydrochlorothiazide, and pindolol. Blood pressure readings at home are reportedly around 140/60 to 150/90.  Cerumen Impaction.  Auditory canal(s) of both ears are completely obstructed with cerumen. Patient counseled on removal of cerumen. Patient consented. Cerumen was removed using gentle irrigation. Tympanic membranes are intact following the procedure.  Auditory canals are partially impacted after procedure.  Patient tolerated procedure well.   History reviewed. No pertinent surgical history.  Outpatient Medications Prior to Visit  Medication Sig Dispense Refill    amLODipine (NORVASC) 10 MG tablet TAKE 1 TABLET(10 MG) BY MOUTH EVERY MORNING 90 tablet 1   hydrALAZINE (APRESOLINE) 25 MG tablet Take 1 tablet (25 mg total) by mouth 3 (three) times daily. 270 tablet 3   losartan-hydrochlorothiazide (HYZAAR) 50-12.5 MG tablet Take 0.5 tablets by mouth every morning. 90 tablet 1   pindolol (VISKEN) 5 MG tablet Take 1 tablet (5 mg total) by mouth 2 (two) times daily. 180 tablet 3   acetaminophen (TYLENOL) 325 MG tablet Take 2 tablets (650 mg total) by mouth every 6 (six) hours as needed for mild pain or headache (fever >/= 101). (Patient not taking: Reported on 07/24/2022)     Bioflavonoid Products (VITAMIN C) CHEW Chew 1 each by mouth daily. (Patient not taking: Reported on 07/24/2022)     dorzolamide-timolol (COSOPT) 22.3-6.8 MG/ML ophthalmic solution 1 drop 2 (two) times daily. (Patient not taking: Reported on 07/24/2022)     isosorbide dinitrate (ISORDIL) 30 MG tablet Take 1 tablet (30 mg total) by mouth 3 (three) times daily. (Patient not taking: Reported on 07/24/2022) 90 tablet 2   No facility-administered medications prior to visit.    Family History  Problem Relation Age of Onset   Alzheimer's disease Mother    Hypertension Father    Other Sister        Died at birth   Cancer Brother        spinal   Heart attack Brother     Social History   Socioeconomic History   Marital status: Widowed    Spouse name: Not on file   Number of children: 1   Years of education: Not on file   Highest education level: Not  on file  Occupational History   Not on file  Tobacco Use   Smoking status: Never    Passive exposure: Never   Smokeless tobacco: Never  Vaping Use   Vaping Use: Never used  Substance and Sexual Activity   Alcohol use: Never   Drug use: Never   Sexual activity: Not on file  Other Topics Concern   Not on file  Social History Narrative   Not on file   Social Determinants of Health   Financial Resource Strain: Not on file  Food  Insecurity: Not on file  Transportation Needs: Not on file  Physical Activity: Not on file  Stress: Not on file  Social Connections: Not on file  Intimate Partner Violence: Not on file                                                                                                 Objective:  Physical Exam: BP (!) 174/90 (BP Location: Right Arm, Patient Position: Sitting, Cuff Size: Large)   Pulse 70   Temp 97.6 F (36.4 C) (Temporal)   Ht 5' 2"$  (1.575 m)   Wt 141 lb 9.6 oz (64.2 kg)   SpO2 96%   BMI 25.90 kg/m    General: Elderly, frail, wheelchair-bound no acute distress. Awake and conversant.  Eyes: Normal conjunctiva, anicteric. Round symmetric pupils.  ENT: See note above Neck: Neck is supple. No masses or thyromegaly.  Respiratory: Respirations are non-labored. No auditory wheezing.  Skin: Warm. No rashes or ulcers.  Psych: Alert and oriented. Cooperative, Appropriate mood and affect, Normal judgment.  CV: No cyanosis or JVD MSK: No clubbing  Neuro: Sensation and CN II-XII grossly normal.       Alesia Banda, MD, MS

## 2022-07-24 NOTE — Patient Instructions (Signed)
Blood Pressure Management:  Continue taking all blood pressure medications as prescribed. Monitor blood pressure at home and record the readings. A referral will be placed to the specialized blood pressure clinic for further evaluation and management. You will be connected with pharmacists who will help monitor blood pressure and provide advice. Medication Refills:  All current prescriptions will be refilled, including the blood pressure medications amlodipine, hydralazine, and the Losartan/hydrochlorothiazide combo pill. The Isosorbide and related medications listed should be reconciled to ensure no duplication or unnecessary intake is occurring.  Eye Care:  Continue using prescribed glaucoma eye drops as directed. A referral to a new ophthalmologist will be made. Attend scheduled appointments and follow recommendations for eye health and management of glaucoma. Report any worsening of vision or symptoms immediately. Ear Care:  Earwax removal will be attempted today to alleviate symptoms of fullness and to potentially improve dizziness. Avoid using Q tips as they can push wax further into the ear canal. Acetic acid drops will be prescribed. Use as directed to help prevent the buildup of earwax. Referral to ENT for further workup and evaluation has been placed. Vestibular Rehab:  Since dizziness is an ongoing concern, a referral will be made for vestibular rehabilitation therapy to address vertigo and improve balance.  Physical Therapy:  A referral for home physical therapy will be placed to assist with strength, mobility, and stability. This can also help reduce the fear of falling.  Follow-up Appointment:  Schedule a follow-up appointment in about a month to reassess blood pressure, dizziness, and to evaluate the effectiveness of the treatments initiated. Remember to always take medications as directed, attend all follow-up appointments, and communicate any changes in health status  to your healthcare provider promptly.

## 2022-07-26 ENCOUNTER — Telehealth: Payer: Self-pay | Admitting: Family Medicine

## 2022-07-26 DIAGNOSIS — I1A0 Resistant hypertension: Secondary | ICD-10-CM | POA: Insufficient documentation

## 2022-07-26 DIAGNOSIS — H6123 Impacted cerumen, bilateral: Secondary | ICD-10-CM | POA: Insufficient documentation

## 2022-07-26 DIAGNOSIS — Z7409 Other reduced mobility: Secondary | ICD-10-CM | POA: Insufficient documentation

## 2022-07-26 DIAGNOSIS — H409 Unspecified glaucoma: Secondary | ICD-10-CM | POA: Insufficient documentation

## 2022-07-26 DIAGNOSIS — R54 Age-related physical debility: Secondary | ICD-10-CM | POA: Insufficient documentation

## 2022-07-26 DIAGNOSIS — R42 Dizziness and giddiness: Secondary | ICD-10-CM | POA: Insufficient documentation

## 2022-07-26 NOTE — Telephone Encounter (Signed)
The referral she was supposed to go to called and stated this was not the office she needs to go to be a general optomologist.

## 2022-07-26 NOTE — Telephone Encounter (Signed)
Per [facility] ophthalmology, they do not specialize in glaucoma and we can refer patient to either [facility] or [facility].  This information was given to patient's son as well.

## 2022-07-27 NOTE — Telephone Encounter (Signed)
Referral sent 

## 2022-07-29 NOTE — Assessment & Plan Note (Signed)
Patient's history suggestive of glaucoma with associated dizziness and dryness, potentially due to glaucoma treatment.  Differential diagnosis: Worsening glaucoma vs. side effects of treatment. Plan:  Ophthalmology referral for follow-up on glaucoma management. Discuss the benefit-risk profile of glaucoma medications contributing to systemic side effects like dizziness.

## 2022-07-29 NOTE — Assessment & Plan Note (Signed)
Patient's physical condition presents a high risk for falls and resultant complications.  Differential diagnosis: Polypharmacy, mobility issues, muscle weakness. Plan:  Implement physical therapy to improve strength and mobility. Connect patient with community support resources as appropriate. Consider a home health referral for further in-home support and assessment.

## 2022-07-29 NOTE — Assessment & Plan Note (Signed)
Summary: The patient presents with uncontrolled hypertension despite being on multiple medications.  Differential diagnosis: Secondary causes of hypertension (e.g., renal artery stenosis, primary hyperaldosteronism).  Plan:  Refer to [facility] to address complex hypertension management. Consider pharmacist-led home blood pressure monitoring program. Review current medications and adherence; adjust as appropriate.

## 2022-07-29 NOTE — Assessment & Plan Note (Signed)
Patient suffers from chronic cerumen impaction contributing to hearing loss and fullness sensation in both ears.  Differential diagnosis: Cerumen impaction versus other causes of hearing impairment (e.g., presbycusis, otitis media).  Plan:  Ear irrigation to remove cerumen impaction. Prescribe acetic acid drops to prevent further accumulation of ear wax. ENT referral may be considered for persistent symptoms or complications.

## 2022-07-29 NOTE — Assessment & Plan Note (Signed)
Complaints of daily headaches and dizziness, significantly impacting the quality of life and concern for falls.  Differential diagnosis: Benign positional vertigo, vestibular neuritis, Meniere's disease.  Plan:  Physical therapy referral for vestibular rehabilitation. Monitor and adjust blood pressure to reduce dizziness. Review medications for possible contributions to dizziness and headache symptoms.

## 2022-08-03 ENCOUNTER — Telehealth: Payer: Self-pay | Admitting: Family Medicine

## 2022-08-03 NOTE — Telephone Encounter (Signed)
Caller Name: Philis Nettle from [facility]  Call back phone #: [phone]  Reason for Call: Verbal orders for OT 1xwx1 2xwx1 1xwx2 to include ADL and IADL to increase exercise and activity.  PT eval 1xwx1 Needs vital sign parameters can use theirs or yours.

## 2022-08-06 ENCOUNTER — Telehealth: Payer: Self-pay

## 2022-08-06 NOTE — Progress Notes (Signed)
  Chronic Care Management   Note  08/06/2022 Name: Stacy Huang MRN: [id number] DOB: 30-Apr-1921  Stacy Huang is a 89 y.o. year old female who is a primary care patient of Bonnita Hollow, MD. I reached out to Romona Curls by phone today in response to a referral sent by [name] PCP.  [name] was given information about Chronic Care Management services today including:  CCM service includes personalized support from designated clinical staff supervised by the physician, including individualized plan of care and coordination with other care providers 24/7 contact phone numbers for assistance for urgent and routine care needs. Service will only be billed when office clinical staff spend 20 minutes or more in a month to coordinate care. Only one practitioner may furnish and bill the service in a calendar month. The patient may stop CCM services at amy time (effective at the end of the month) by phone call to the office staff. The patient will be responsible for cost sharing (co-pay) or up to 20% of the service fee (after annual deductible is met)  [name]  agreedto scheduling an appointment with the CCM RN Case Manager   Follow up plan: Patient agreed to scheduled appointment with RN Case Manager on 08/07/2022(date/time).   [name], [place], [place] [address] Direct Dial: [phone] [email or web address]

## 2022-08-06 NOTE — Progress Notes (Signed)
   Care Guide Note  08/06/2022 Name: Stacy Huang MRN: [id number] DOB: [birth year]  Referred by: Bonnita Hollow, MD Reason for referral : Chronic Care Management (Outreach to schedule referral)   Stacy Huang is a 89 y.o. year old female who is a primary care patient of Bonnita Hollow, MD. Stacy Huang was referred to the pharmacist for assistance related to HTN and HLD.    Successful contact was made with the patient to discuss pharmacy services including being ready for the pharmacist to call at least 5 minutes before the scheduled appointment time, to have medication bottles and any blood sugar or blood pressure readings ready for review. The patient agreed to meet with the pharmacist via with the pharmacist via telephone visit on (date/time).  08/08/2022  [name], [place], [place] [address] Direct Dial: [phone] [email or web address]

## 2022-08-07 ENCOUNTER — Ambulatory Visit (INDEPENDENT_AMBULATORY_CARE_PROVIDER_SITE_OTHER): Payer: 59 | Admitting: *Deleted

## 2022-08-07 DIAGNOSIS — I1A0 Resistant hypertension: Secondary | ICD-10-CM

## 2022-08-07 DIAGNOSIS — N1832 Chronic kidney disease, stage 3b: Secondary | ICD-10-CM

## 2022-08-07 NOTE — Patient Instructions (Signed)
Please call the care guide team at [phone] if you need to cancel or reschedule your appointment.   If you are experiencing a Mental Health or [facility] or need someone to talk to, please call the Suicide and Crisis Lifeline: 988 call the [place] National Suicide Prevention Lifeline: 780-405-8699 or TTY: 4300412494 TTY 930-612-5235) to talk to a trained counselor call 1-800-273-TALK (toll free, 24 hour hotline) go to [facility] [address] [place] [phone]) call 911   Following is a copy of the CCM Program Consent:  CCM service includes personalized support from designated clinical staff supervised by the physician, including individualized plan of care and coordination with other care providers 24/7 contact phone numbers for assistance for urgent and routine care needs. Service will only be billed when office clinical staff spend 20 minutes or more in a month to coordinate care. Only one practitioner may furnish and bill the service in a calendar month. The patient may stop CCM services at amy time (effective at the end of the month) by phone call to the office staff. The patient will be responsible for cost sharing (co-pay) or up to 20% of the service fee (after annual deductible is met)  Following is a copy of your full provider care plan:   Goals Addressed             This Visit's Progress    CCM (CHRONIC KIDNEY DISEASE) EXPECTED OUTCOME: MONITOR, SELF-MANAGE AND REDUCE SYMPTOMS OF CHRONIC KIDNEY DISEASE       Current Barriers:  Knowledge Deficits related to Chronic Kidney Disease management Chronic Disease Management support and education needs related to Chronic Kidney Disease Patient's son answered the phone, RN care manager spoke with patient and offered to speak with patient's son, patient did not give permission to speak with her son, states she felt comfortable answering all questions Patient reports she has  difficulty hearing and has problems with wax buildup, has used ear drops stating it has helped some  Planned Interventions: Assessed the patient     understanding of chronic kidney disease    Evaluation of current treatment plan related to chronic kidney disease self management and patient's adherence to plan as established by provider      Provided education to patient re: stroke prevention, s/s of heart attack and stroke    Reviewed prescribed diet low sodium diet Reviewed medications with patient and discussed importance of compliance    Advised patient, providing education and rationale, to monitor blood pressure daily and record, calling PCP for findings outside established parameters    Discussed complications of poorly controlled blood pressure such as heart disease, stroke, circulatory complications, vision complications, kidney impairment, sexual dysfunction    Discussed plans with patient for ongoing care management follow up and provided patient with direct contact information for care management team    Screening for signs and symptoms of depression related to chronic disease state      Assessed social determinant of health barriers     Symptom Management: Take medications as prescribed   Attend all scheduled provider appointments Call pharmacy for medication refills 3-7 days in advance of running out of medications Call provider office for new concerns or questions  Look over education mailed- stroke and heart attack Follow low sodium and heart healthy diet Read food labels, limit fast food  Follow Up Plan: Telephone follow up appointment with care management team member scheduled for:  10/01/22 at 9 am  CCM (HYPERTENSION) EXPECTED OUTCOME: MONITOR, SELF-MANAGE AND REDUCE SYMPTOMS OF HYPERTENSION       Current Barriers:  Knowledge Deficits related to Hypertension management Chronic Disease Management support and education needs related to Hypertension, diet No Advanced  Directives in place-patient declines information Patient reports she lives with her son, pt is able to cook some and her son also helps cook, son provides oversight with medications, transportation and with any other assistance pt may need Patient reports she has walker that she uses at home, and wheelchair she uses when she leaves home because she cannot walk long distances Patient reports she is seeing cardiologist Dr. Einar Gip for help with managing blood pressure  Planned Interventions: Evaluation of current treatment plan related to hypertension self management and patient's adherence to plan as established by provider;   Reviewed prescribed diet low sodium Reviewed medications with patient and discussed importance of compliance;  Discussed plans with patient for ongoing care management follow up and provided patient with direct contact information for care management team; Advised patient, providing education and rationale, to monitor blood pressure daily and record, calling PCP for findings outside established parameters;  Advised patient to discuss any issues with blood pressure, medications with provider; Discussed complications of poorly controlled blood pressure such as heart disease, stroke, circulatory complications, vision complications, kidney impairment, sexual dysfunction;  Screening for signs and symptoms of depression related to chronic disease state;  Assessed social determinant of health barriers;  Education mailed - low sodium diet  Symptom Management: Take medications as prescribed   Attend all scheduled provider appointments Call pharmacy for medication refills 3-7 days in advance of running out of medications Call provider office for new concerns or questions  check blood pressure daily choose a place to take my blood pressure (home, clinic or office, retail store) write blood pressure results in a log or diary learn about high blood pressure keep a blood pressure  log take blood pressure log to all doctor appointments call doctor for signs and symptoms of high blood pressure keep all doctor appointments take medications for blood pressure exactly as prescribed report new symptoms to your doctor eat more whole grains, fruits and vegetables, lean meats and healthy fats Look over education mailed- low sodium diet  Follow Up Plan: Telephone follow up appointment with care management team member scheduled for:  10/01/22 at 9 am          The patient verbalized understanding of instructions, educational materials, and care plan provided today and agreed to receive a mailed copy of patient instructions, educational materials, and care plan.   Telephone follow up appointment with care management team member scheduled for:  10/01/22 at 9 am  Heart Attack - Warning Signs and What to Do A heart attack is a condition that occurs when your heart does not get enough oxygen. When this happens, the heart muscle begins to die. This can cause lasting damage if not treated right away. A heart attack is a medical emergency. Be aware of the warning signs of a heart attack and act quickly. Risk Factors Your health care provider will talk with you about risk factors. These may include: Aging. Having a personal or family history of chest pain, heart attack, stroke, or narrowing of the arteries due to plaque buildup. Having taken chemotherapy or immune-suppressing medicines. Being female. Having a pregnancy history that includes pre-eclampsia. Being overweight or obese. Having any of these conditions: High blood pressure. High cholesterol. Diabetes. Certain lifestyle choices may put you  at a higher risk of a heart attack. These include: Drinking too much alcohol. Not getting regular exercise. Smoking or using tobacco products. Using street drugs. Warning signs or symptoms of a heart attack Heart damage can occur within the first few hours of a heart attack. This is  why it is important to recognize early symptoms and get help right away. Warning signs and symptoms may include: Chest pain that may feel like: Crushing or squeezing. Tightness, pressure, fullness, or heaviness. Pain in the arm, neck, jaw, back, or upper body. Heartburn or upset stomach. Shortness of breath. Vomiting or nausea. Sudden sweating or clammy skin. Sudden light-headedness, dizziness, or passing out. Feeling tired (fatigue). Sudden feeling of anxiety. Fast or irregular heartbeat (palpitations). Heart attacks can be sudden and intense or can start slowly, with mild pain or discomfort. In some cases, heart attacks may happen with very mild symptoms or no symptoms at all (silent heart attacks). Silent heart attacks are more common in older adults or people with diabetes. Heart attack symptoms that can be mistaken for a less serious health issue Certain symptoms can be quickly associated with a heart attack, such as chest pain or pressure. However, there are other symptoms that may seem less serious or not related to a heart attack, but they actually are. These symptoms are called atypical symptoms. Be alert for the following atypical symptoms: Sharp pain in the side or chest that occurs with coughing or breathing. Shortness of breath or trouble breathing. Pain that spreads above the jaw or down into the body. Can symptoms differ between men and women? Heart attack symptoms can be different among men and women. Men may feel pain and numbness in the left side of their chest, while women feel it in the right side. Women may have signs and symptoms that may not be quickly associated with a heart attack. They may be more subtle and sometimes confusing. These include: Nausea. Dizziness. Shortness of breath. Fatigue. Upper back pain that travels up the back, neck, and into the jaw. Where to find more information American Heart Association: heart.org Centers for Disease Control and  Prevention: Store[employer].cy Get help right away if: You have any combination of these symptoms: Severe chest discomfort, especially if the pain is crushing or pressure-like and spreads to your arms, back, neck, or jaw. Discomfort in the chest, neck, arm, jaw, or back (angina) that lasts for longer than 5 minutes and medicine does not help. Palpitations. Trouble breathing or shortness of breath. Sudden sweating or clammy skin. Nausea or vomiting. Unexplained tiredness or weakness. These symptoms may be an emergency. Get help right away. Call 911. Do not wait to see if the symptoms will go away. Do not drive yourself to the hospital. This information is not intended to replace advice given to you by your health care provider. Make sure you discuss any questions you have with your health care provider. Document Revised: 11/23/2021 Document Reviewed: 11/23/2021 Elsevier Patient Education  [place]. Stroke Prevention Some medical conditions and lifestyle choices can lead to a higher risk for a stroke. You can help to prevent a stroke by eating healthy foods and exercising. It also helps to not smoke and to manage any health problems you may have. How can this condition affect me? A stroke is an emergency. It should be treated right away. A stroke can lead to brain damage or threaten your life. There is a better chance of surviving and getting better after a stroke if you  get medical help right away. What can increase my risk? The following medical conditions may increase your risk of a stroke: Diseases of the heart and blood vessels (cardiovascular disease). High blood pressure (hypertension). Diabetes. High cholesterol. Sickle cell disease. Problems with blood clotting. Being very overweight. Sleeping problems (obstructivesleep apnea). Other risk factors include: Being older than age 51. A history of blood clots, stroke, or mini-stroke (TIA). Race, ethnic background, or a family  history of stroke. Smoking or using tobacco products. Taking birth control pills, especially if you smoke. Heavy alcohol and drug use. Not being active. What actions can I take to prevent this? Manage your health conditions High cholesterol. Eat a healthy diet. If this is not enough to manage your cholesterol, you may need to take medicines. Take medicines as told by your doctor. High blood pressure. Try to keep your blood pressure below 130/80. If your blood pressure cannot be managed through a healthy diet and regular exercise, you may need to take medicines. Take medicines as told by your doctor. Ask your doctor if you should check your blood pressure at home. Have your blood pressure checked every year. Diabetes. Eat a healthy diet and get regular exercise. If your blood sugar (glucose) cannot be managed through diet and exercise, you may need to take medicines. Take medicines as told by your doctor. Talk to your doctor about getting checked for sleeping problems. Signs of a problem can include: Snoring a lot. Feeling very tired. Make sure that you manage any other conditions you have. Nutrition  Follow instructions from your doctor about what to eat or drink. You may be told to: Eat and drink fewer calories each day. Limit how much salt (sodium) you use to 1,500 milligrams (mg) each day. Use only healthy fats for cooking, such as olive oil, canola oil, and sunflower oil. Eat healthy foods. To do this: Choose foods that are high in fiber. These include whole grains, and fresh fruits and vegetables. Eat at least 5 servings of fruits and vegetables a day. Try to fill one-half of your plate with fruits and vegetables at each meal. Choose low-fat (lean) proteins. These include low-fat cuts of meat, chicken without skin, fish, tofu, beans, and nuts. Eat low-fat dairy products. Avoid foods that: Are high in salt. Have saturated fat. Have trans fat. Have cholesterol. Are processed  or pre-made. Count how many carbohydrates you eat and drink each day. Lifestyle If you drink alcohol: Limit how much you have to: 0-1 drink a day for women who are not pregnant. 0-2 drinks a day for men. Know how much alcohol is in your drink. In the [place] one drink equals one 12 oz bottle of beer (340m), one 5 oz glass of wine (1474m, or one 1 oz glass of hard liquor (4444m Do not smoke or use any products that have nicotine or tobacco. If you need help quitting, ask your doctor. Avoid secondhand smoke. Do not use drugs. Activity  Try to stay at a healthy weight. Get at least 30 minutes of exercise on most days, such as: Fast walking. Biking. Swimming. Medicines Take over-the-counter and prescription medicines only as told by your doctor. Avoid taking birth control pills. Talk to your doctor about the risks of taking birth control pills if: You are over 35 54ars old. You smoke. You get very bad headaches. You have had a blood clot. Where to find more information American Stroke Association: www.strokeassociation.org Get help right away if: You or a loved one has  any signs of a stroke. "BE FAST" is an easy way to remember the warning signs: B - Balance. Dizziness, sudden trouble walking, or loss of balance. E - Eyes. Trouble seeing or a change in how you see. F - Face. Sudden weakness or loss of feeling of the face. The face or eyelid may droop on one side. A - Arms. Weakness or loss of feeling in an arm. This happens all of a sudden and most often on one side of the body. S - Speech. Sudden trouble speaking, slurred speech, or trouble understanding what people say. T - Time. Time to call emergency services. Write down what time symptoms started. You or a loved one has other signs of a stroke, such as: A sudden, very bad headache with no known cause. Feeling like you may vomit (nausea). Vomiting. A seizure. These symptoms may be an emergency. Get help right away. Call your  local emergency services (911 in the [place] Do not wait to see if the symptoms will go away. Do not drive yourself to the hospital. Summary You can help to prevent a stroke by eating healthy, exercising, and not smoking. It also helps to manage any health problems you have. Do not smoke or use any products that contain nicotine or tobacco. Get help right away if you or a loved one has any signs of a stroke. This information is not intended to replace advice given to you by your health care provider. Make sure you discuss any questions you have with your health care provider. Document Revised: 12/10/2019 Document Reviewed: 12/28/2019 Elsevier Patient Education  [place]. Low-Sodium Eating Plan Sodium, which is an element that makes up salt, helps you maintain a healthy balance of fluids in your body. Too much sodium can increase your blood pressure and cause fluid and waste to be held in your body. Your health care provider or dietitian may recommend following this plan if you have high blood pressure (hypertension), kidney disease, liver disease, or heart failure. Eating less sodium can help lower your blood pressure, reduce swelling, and protect your heart, liver, and kidneys. What are tips for following this plan? Reading food labels The Nutrition Facts label lists the amount of sodium in one serving of the food. If you eat more than one serving, you must multiply the listed amount of sodium by the number of servings. Choose foods with less than 140 mg of sodium per serving. Avoid foods with 300 mg of sodium or more per serving. Shopping  Look for lower-sodium products, often labeled as "low-sodium" or "no salt added." Always check the sodium content, even if foods are labeled as "unsalted" or "no salt added." Buy fresh foods. Avoid canned foods and pre-made or frozen meals. Avoid canned, cured, or processed meats. Buy breads that have less than 80 mg of sodium per  slice. Cooking  Eat more home-cooked food and less restaurant, buffet, and fast food. Avoid adding salt when cooking. Use salt-free seasonings or herbs instead of table salt or sea salt. Check with your health care provider or pharmacist before using salt substitutes. Cook with plant-based oils, such as canola, sunflower, or olive oil. Meal planning When eating at a restaurant, ask that your food be prepared with less salt or no salt, if possible. Avoid dishes labeled as brined, pickled, cured, smoked, or made with soy sauce, miso, or teriyaki sauce. Avoid foods that contain MSG (monosodium glutamate). MSG is sometimes added to [place] food, bouillon, and some canned foods.  Make meals that can be grilled, baked, poached, roasted, or steamed. These are generally made with less sodium. General information Most people on this plan should limit their sodium intake to 1,500-2,000 mg (milligrams) of sodium each day. What foods should I eat? Fruits Fresh, frozen, or canned fruit. Fruit juice. Vegetables Fresh or frozen vegetables. "No salt added" canned vegetables. "No salt added" tomato sauce and paste. Low-sodium or reduced-sodium tomato and vegetable juice. Grains Low-sodium cereals, including oats, puffed wheat and rice, and shredded wheat. Low-sodium crackers. Unsalted rice. Unsalted pasta. Low-sodium bread. Whole-grain breads and whole-grain pasta. Meats and other proteins Fresh or frozen (no salt added) meat, poultry, seafood, and fish. Low-sodium canned tuna and salmon. Unsalted nuts. Dried peas, beans, and lentils without added salt. Unsalted canned beans. Eggs. Unsalted nut butters. Dairy Milk. Soy milk. Cheese that is naturally low in sodium, such as ricotta cheese, fresh mozzarella, or Swiss cheese. Low-sodium or reduced-sodium cheese. Cream cheese. Yogurt. Seasonings and condiments Fresh and dried herbs and spices. Salt-free seasonings. Low-sodium mustard and ketchup. Sodium-free salad  dressing. Sodium-free light mayonnaise. Fresh or refrigerated horseradish. Lemon juice. Vinegar. Other foods Homemade, reduced-sodium, or low-sodium soups. Unsalted popcorn and pretzels. Low-salt or salt-free chips. The items listed above may not be a complete list of foods and beverages you can eat. Contact a dietitian for more information. What foods should I avoid? Vegetables Sauerkraut, pickled vegetables, and relishes. Olives. [place] fries. Onion rings. Regular canned vegetables (not low-sodium or reduced-sodium). Regular canned tomato sauce and paste (not low-sodium or reduced-sodium). Regular tomato and vegetable juice (not low-sodium or reduced-sodium). Frozen vegetables in sauces. Grains Instant hot cereals. Bread stuffing, pancake, and biscuit mixes. Croutons. Seasoned rice or pasta mixes. Noodle soup cups. Boxed or frozen macaroni and cheese. Regular salted crackers. Self-rising flour. Meats and other proteins Meat or fish that is salted, canned, smoked, spiced, or pickled. Precooked or cured meat, such as sausages or meat loaves. Berniece Salines. Ham. Pepperoni. Hot dogs. Corned beef. Chipped beef. Salt pork. Jerky. Pickled herring. Anchovies and sardines. Regular canned tuna. Salted nuts. Dairy Processed cheese and cheese spreads. Hard cheeses. Cheese curds. Blue cheese. Feta cheese. String cheese. Regular cottage cheese. Buttermilk. Canned milk. Fats and oils Salted butter. Regular margarine. Ghee. Bacon fat. Seasonings and condiments Onion salt, garlic salt, seasoned salt, table salt, and sea salt. Canned and packaged gravies. Worcestershire sauce. Tartar sauce. Barbecue sauce. Teriyaki sauce. Soy sauce, including reduced-sodium. Steak sauce. Fish sauce. Oyster sauce. Cocktail sauce. Horseradish that you find on the shelf. Regular ketchup and mustard. Meat flavorings and tenderizers. Bouillon cubes. Hot sauce. Pre-made or packaged marinades. Pre-made or packaged taco seasonings. Relishes.  Regular salad dressings. Salsa. Other foods Salted popcorn and pretzels. Corn chips and puffs. Potato and tortilla chips. Canned or dried soups. Pizza. Frozen entrees and pot pies. The items listed above may not be a complete list of foods and beverages you should avoid. Contact a dietitian for more information. Summary Eating less sodium can help lower your blood pressure, reduce swelling, and protect your heart, liver, and kidneys. Most people on this plan should limit their sodium intake to 1,500-2,000 mg (milligrams) of sodium each day. Canned, boxed, and frozen foods are high in sodium. Restaurant foods, fast foods, and pizza are also very high in sodium. You also get sodium by adding salt to food. Try to cook at home, eat more fresh fruits and vegetables, and eat less fast food and canned, processed, or prepared foods. This information is not intended to  replace advice given to you by your health care provider. Make sure you discuss any questions you have with your health care provider. Document Revised: 05/04/2019 Document Reviewed: 04/29/2019 Elsevier Patient Education  [place].

## 2022-08-07 NOTE — Plan of Care (Signed)
Chronic Care Management Provider Comprehensive Care Plan    08/07/2022 Name: Stacy Huang MRN: [id number] DOB: May 14, 1921  Referral to Chronic Care Management (CCM) services was placed by Provider:  Josephine Igo MD on Date: 07/24/22.  Chronic Condition 1: HYPERTENSION Provider Assessment and Plan  Problem: Resistant hypertension  Editor: Bonnita Hollow, MD (Physician)              Summary: The patient presents with uncontrolled hypertension despite being on multiple medications.   Differential diagnosis: Secondary causes of hypertension (e.g., renal artery stenosis, primary hyperaldosteronism).   Plan:   [facility] to [facility] to address complex hypertension management. Consider pharmacist-led home blood pressure monitoring program. Review current medications and adherence; adjust as appropriate.       Expected Outcome/Goals Addressed This Visit (Provider CCM goals/Provider Assessment and plan  CCM (HYPERTENSION) EXPECTED OUTCOME: MONITOR, SELF-MANAGE AND REDUCE SYMPTOMS OF HYPERTENSION  Symptom Management Condition 1: Take medications as prescribed   Attend all scheduled provider appointments Call pharmacy for medication refills 3-7 days in advance of running out of medications Call provider office for new concerns or questions  check blood pressure daily choose a place to take my blood pressure (home, clinic or office, retail store) write blood pressure results in a log or diary learn about high blood pressure keep a blood pressure log take blood pressure log to all doctor appointments call doctor for signs and symptoms of high blood pressure keep all doctor appointments take medications for blood pressure exactly as prescribed report new symptoms to your doctor eat more whole grains, fruits and vegetables, lean meats and healthy fats Look over education mailed- low sodium diet  Chronic Condition 2: CHRONIC KIDNEY DISEASE Provider Assessment and  Plan   Genitourinary     Stage 3b chronic kidney disease ([place])    Relevant Orders    AMB Referral to Chronic Care Management Services      Expected Outcome/Goals Addressed This Visit (Provider CCM goals/Provider Assessment and plan  CCM (CHRONIC KIDNEY DISEASE) EXPECTED OUTCOME: MONITOR, SELF-MANAGE AND REDUCE SYMPTOMS OF CHRONIC KIDNEY DISEASE  Symptom Management Condition 2: Take medications as prescribed   Attend all scheduled provider appointments Call pharmacy for medication refills 3-7 days in advance of running out of medications Call provider office for new concerns or questions  Look over education mailed- stroke and heart attack Follow low sodium and heart healthy diet Read food labels, limit fast food  Problem List Patient Active Problem List   Diagnosis Date Noted   Hearing loss of both ears due to cerumen impaction 07/26/2022   Resistant hypertension 07/26/2022   Dizziness 07/26/2022   Vertigo 07/26/2022   Frail elderly 07/26/2022   Difficulty transferring from wheelchair to chair 07/26/2022   Glaucoma of right eye 07/26/2022   Dizziness and giddiness 10/09/2021   History of deep vein thrombosis (DVT) of lower extremity 04/21/2021   Essential hypertension 04/21/2021   Hypothyroidism 04/21/2021   Hyperlipidemia 04/21/2021   Compression fracture of L1 vertebra ([place]) 04/21/2021   Stage 3b chronic kidney disease ([place]) 03/02/2020   Acute hypoxemic respiratory failure due to COVID-19 ([place]) 02/27/2020   Hypertensive urgency 02/27/2020    Medication Management  Current Outpatient Medications:    amLODipine (NORVASC) 10 MG tablet, TAKE 1 TABLET(10 MG) BY MOUTH EVERY MORNING, Disp: 90 tablet, Rfl: 1   hydrALAZINE (APRESOLINE) 25 MG tablet, Take 1 tablet (25 mg total) by mouth 3 (three) times daily., Disp: 270 tablet, Rfl: 3   losartan-hydrochlorothiazide (  HYZAAR) 50-12.5 MG tablet, Take 0.5 tablets by mouth every morning., Disp: 90 tablet, Rfl: 1   pindolol (VISKEN)  5 MG tablet, Take 1 tablet (5 mg total) by mouth 2 (two) times daily., Disp: 180 tablet, Rfl: 3  Cognitive Assessment Identity Confirmed: : Name; DOB Cognitive Status: Normal   Functional Assessment Hearing Difficulty or Deaf: yes Hearing Management: does not wear hearing aides, using ear drops for wax buildup Wear Glasses or Blind: no (pt states cataracts removed and can see better) Concentrating, Remembering or Making Decisions Difficulty (CP): no Difficulty Communicating: no Difficulty Eating/Swallowing: no Walking or Climbing Stairs Difficulty: no Dressing/Bathing Difficulty: yes Dressing/Bathing: bathing difficulty, requires equipment Dressing/Bathing Management: sits on stool and bathes Doing Errands Independently Difficulty (such as shopping) (CP): yes Errands Management: son assists   Caregiver Assessment  Primary Source of Support/Comfort: child(ren) Name of Support/Comfort Primary Source: son- Dorene Sorrow People in Home: child(ren), adult   Planned Interventions  Assessed the patient     understanding of chronic kidney disease    Evaluation of current treatment plan related to chronic kidney disease self management and patient's adherence to plan as established by provider      Provided education to patient re: stroke prevention, s/s of heart attack and stroke    Reviewed prescribed diet low sodium diet Reviewed medications with patient and discussed importance of compliance    Advised patient, providing education and rationale, to monitor blood pressure daily and record, calling PCP for findings outside established parameters    Discussed complications of poorly controlled blood pressure such as heart disease, stroke, circulatory complications, vision complications, kidney impairment, sexual dysfunction    Discussed plans with patient for ongoing care management follow up and provided patient with direct contact information for care management team    Screening for  signs and symptoms of depression related to chronic disease state      Assessed social determinant of health barriers    Evaluation of current treatment plan related to hypertension self management and patient's adherence to plan as established by provider;   Reviewed prescribed diet low sodium Reviewed medications with patient and discussed importance of compliance;  Discussed plans with patient for ongoing care management follow up and provided patient with direct contact information for care management team; Advised patient, providing education and rationale, to monitor blood pressure daily and record, calling PCP for findings outside established parameters;  Advised patient to discuss any issues with blood pressure, medications with provider; Discussed complications of poorly controlled blood pressure such as heart disease, stroke, circulatory complications, vision complications, kidney impairment, sexual dysfunction;  Screening for signs and symptoms of depression related to chronic disease state;  Assessed social determinant of health barriers;  Education mailed - low sodium diet  Interaction and coordination with outside resources, practitioners, and providers See CCM Referral  Care Plan: Printed and mailed to patient

## 2022-08-07 NOTE — Chronic Care Management (AMB) (Signed)
Chronic Care Management   CCM RN Visit Note  08/07/2022 Name: Stacy Huang MRN: [id number] DOB: October 07, 1920  Subjective: Stacy Huang is a 89 y.o. year old female who is a primary care patient of Bonnita Hollow, MD. The patient was referred to the Chronic Care Management team for assistance with care management needs subsequent to provider initiation of CCM services and plan of care.    Today's Visit:  Engaged with patient by telephone for initial visit.     SDOH Interventions Today    Flowsheet Row Most Recent Value  SDOH Interventions   Food Insecurity Interventions Intervention Not Indicated  Housing Interventions Intervention Not Indicated  Transportation Interventions Intervention Not Indicated  Utilities Interventions Intervention Not Indicated  Financial Strain Interventions Intervention Not Indicated  Physical Activity Interventions Intervention Not Indicated, Patient Refused  [unable to exercise per pt report]  Stress Interventions Intervention Not Indicated  Social Connections Interventions Intervention Not Indicated         Goals Addressed             This Visit's Progress    CCM (CHRONIC KIDNEY DISEASE) EXPECTED OUTCOME: MONITOR, SELF-MANAGE AND REDUCE SYMPTOMS OF CHRONIC KIDNEY DISEASE       Current Barriers:  Knowledge Deficits related to Chronic Kidney Disease management Chronic Disease Management support and education needs related to Chronic Kidney Disease Patient's son answered the phone, RN care manager spoke with patient and offered to speak with patient's son, patient did not give permission to speak with her son, states she felt comfortable answering all questions Patient reports she has difficulty hearing and has problems with wax buildup, has used ear drops stating it has helped some  Planned Interventions: Assessed the patient     understanding of chronic kidney disease    Evaluation of current treatment plan related to chronic kidney  disease self management and patient's adherence to plan as established by provider      Provided education to patient re: stroke prevention, s/s of heart attack and stroke    Reviewed prescribed diet low sodium diet Reviewed medications with patient and discussed importance of compliance    Advised patient, providing education and rationale, to monitor blood pressure daily and record, calling PCP for findings outside established parameters    Discussed complications of poorly controlled blood pressure such as heart disease, stroke, circulatory complications, vision complications, kidney impairment, sexual dysfunction    Discussed plans with patient for ongoing care management follow up and provided patient with direct contact information for care management team    Screening for signs and symptoms of depression related to chronic disease state      Assessed social determinant of health barriers     Symptom Management: Take medications as prescribed   Attend all scheduled provider appointments Call pharmacy for medication refills 3-7 days in advance of running out of medications Call provider office for new concerns or questions  Look over education mailed- stroke and heart attack Follow low sodium and heart healthy diet Read food labels, limit fast food  Follow Up Plan: Telephone follow up appointment with care management team member scheduled for:  10/01/22 at 9 am       CCM (HYPERTENSION) EXPECTED OUTCOME: MONITOR, SELF-MANAGE AND REDUCE SYMPTOMS OF HYPERTENSION       Current Barriers:  Knowledge Deficits related to Hypertension management Chronic Disease Management support and education needs related to Hypertension, diet No Advanced Directives in place-patient declines information Patient reports she lives with her  son, pt is able to cook some and her son also helps cook, son provides oversight with medications, transportation and with any other assistance pt may need Patient reports  she has walker that she uses at home, and wheelchair she uses when she leaves home because she cannot walk long distances Patient reports she is seeing cardiologist Dr. Einar Gip for help with managing blood pressure  Planned Interventions: Evaluation of current treatment plan related to hypertension self management and patient's adherence to plan as established by provider;   Reviewed prescribed diet low sodium Reviewed medications with patient and discussed importance of compliance;  Discussed plans with patient for ongoing care management follow up and provided patient with direct contact information for care management team; Advised patient, providing education and rationale, to monitor blood pressure daily and record, calling PCP for findings outside established parameters;  Advised patient to discuss any issues with blood pressure, medications with provider; Discussed complications of poorly controlled blood pressure such as heart disease, stroke, circulatory complications, vision complications, kidney impairment, sexual dysfunction;  Screening for signs and symptoms of depression related to chronic disease state;  Assessed social determinant of health barriers;  Education mailed - low sodium diet  Symptom Management: Take medications as prescribed   Attend all scheduled provider appointments Call pharmacy for medication refills 3-7 days in advance of running out of medications Call provider office for new concerns or questions  check blood pressure daily choose a place to take my blood pressure (home, clinic or office, retail store) write blood pressure results in a log or diary learn about high blood pressure keep a blood pressure log take blood pressure log to all doctor appointments call doctor for signs and symptoms of high blood pressure keep all doctor appointments take medications for blood pressure exactly as prescribed report new symptoms to your doctor eat more whole  grains, fruits and vegetables, lean meats and healthy fats Look over education mailed- low sodium diet  Follow Up Plan: Telephone follow up appointment with care management team member scheduled for:  10/01/22 at 9 am          Plan:Telephone follow up appointment with care management team member scheduled for:  10/01/22 at 9 AM  Jacqlyn Larsen [facility], BSN RN Case Manager [place] [phone]

## 2022-08-08 ENCOUNTER — Other Ambulatory Visit: Payer: 59

## 2022-08-09 DIAGNOSIS — N189 Chronic kidney disease, unspecified: Secondary | ICD-10-CM | POA: Diagnosis not present

## 2022-08-09 DIAGNOSIS — I129 Hypertensive chronic kidney disease with stage 1 through stage 4 chronic kidney disease, or unspecified chronic kidney disease: Secondary | ICD-10-CM

## 2022-08-09 NOTE — Telephone Encounter (Signed)
The supervisor of [place] called to check on this order. They really want ot continue working with the pt. Please call in the order.

## 2022-08-09 NOTE — Progress Notes (Signed)
   08/09/2022 Name: Stacy Huang MRN: [id number] DOB: June 20, 1920  Chief Complaint  Patient presents with   Hypertension   DEVONDA RIDL is a 89 y.o. year old female who presented for a telephone visit.  Her son, [name] also assisted in providing information.  Of note, both were somewhat poor historians.   They were referred to the pharmacist by their PCP for assistance in managing hypertension.   Patient is participating in a Managed Medicaid Plan:  No  Subjective: Patient has resistant hypertension and CKD stage III, referral is also in to hypertension clinic Care Team: Primary Care Provider: Bonnita Hollow, MD ; Next Scheduled Visit: 08/21/22  Medication Access/Adherence Patient reports affordability concerns with their medications: No  Patient reports access/transportation concerns to their pharmacy: No  Patient reports adherence concerns with their medications:  No    Hypertension: Current medications: amlodipine 6m daily, hydralazine 261mTID, losartan/hctz 50/12.62m51mALF TABLET daily, pindolol 62mg80mD -Patient does not have a validated, automated, upper arm home BP cuff -Current blood pressure readings readings: 152/65 at cardiology visit 03/26/22 -Does not check at home, but when therapy comes they are monitoring each visit per son, MiltOlga Millerstient/son endorse good adherence to medication, but they do state she is taking ONE tablet instead of prescribed HALF tablet of losartan/hctz  Objective: Lab Results  Component Value Date   CREATININE 2.19 (H) 12/25/2021   BUN 39 (H) 12/25/2021   NA 142 12/25/2021   K 4.3 12/25/2021   CL 105 12/25/2021   CO2 21 12/25/2021   Medications Reviewed Today     Reviewed by GentDarlina GuysH (Pharmacist) on 08/08/22 at 1134  Med List Status: <None>   Medication Order Taking? Sig Documenting Provider Last Dose Status Informant  amLODipine (NORVASC) 10 MG tablet [id number] TAKE 1 TABLET(10 MG) BY MOUTH EVERY MORNING  GanjAdrian Prows Taking Active   hydrALAZINE (APRESOLINE) 25 MG tablet [id number] Take 1 tablet (25 mg total) by mouth 3 (three) times daily. GanjAdrian Prows Taking Active   [facility]artan[facility]hydrochlorothiazide ([facility].5 MG tablet [id number] Take 0.5 tablets by mouth every morning. GanjAdrian Prows Taking Active            Med Note ([facility]Col[facility] Wed Aug 08, 2022 11:32 AM) Taking whole tablet  pindolol (VISKEN) 5 MG tablet [id number] Take 1 tablet (5 mg total) by mouth 2 (two) times daily. GanjAdrian Prows Taking Active             Assessment/Plan:  Hypertension: - Currently uncontrolled - Recommend to ask therapist to write down blood pressure readings at appointments, but it was unclear how often they visit  -Reviewed all medications and evaluated appropriateness relative to current renal function (eGFR 20).  Hydrochlorothiazide is NOT recommended due to lack of efficacy, but losartan use is appropriate if patient is not volume-depleted.  All other medications and dosages are appropriate. -Recommend discontinuing losartan/hctz and initiating losartan 50mg68mly.  Pending prescription for losartan; and if Dr. ThompGrandville Silosgreement, will call patient to educate on therapy change.  Follow Up Plan: Telephone visit scheduled 3/8 to see what BP readings therapy has documented.  Can make further recommendations based on this information.  Initiation of furosemide daily would be appropriate, or increasing TDD of hydralazine.  CheryDarlina GuysrmD, DPLA

## 2022-08-10 NOTE — Telephone Encounter (Signed)
Left a detailed voice message regarding annotation below on Stacy Huang's cell phone.

## 2022-08-17 ENCOUNTER — Other Ambulatory Visit: Payer: 59

## 2022-08-17 NOTE — Progress Notes (Unsigned)
   08/17/2022  Patient ID: Stacy Huang, female   DOB: [birth year], 89 y.o.   MRN: [id number]  Subjective/Objective: Telephone follow-up with Stacy Huang's son, Stacy Huang, in regard to blood pressure control.  -Therapy has been providing them with BP readings when they come out a couple of times a week.  Recent values include:  165/78, 140/64, 139/65, 169/76  Assessment/Plan: -Blood pressure is uncontrolled.  As mentioned before, based on kidney function, hctz is likely providing no benefit. -Discussed a medication change with son, and he expressed understanding and agreement -Discontinuing hctz and pending order for losartan 50mg  daily for Dr. Grandville Silos to sign if in agreement -Will continue to get BP values from therapy -Follow-up telephone visit scheduled in 2 weeks to reassess.  May need to increase losartan to 100mg  if still not at least consistently <140/90.  Darlina Guys, PharmD, DPLA

## 2022-08-21 ENCOUNTER — Encounter: Payer: Self-pay | Admitting: Family Medicine

## 2022-08-21 ENCOUNTER — Ambulatory Visit (INDEPENDENT_AMBULATORY_CARE_PROVIDER_SITE_OTHER): Payer: 59 | Admitting: Family Medicine

## 2022-08-21 VITALS — BP 138/82 | HR 89 | Temp 97.7°F | Wt 142.8 lb

## 2022-08-21 DIAGNOSIS — H6123 Impacted cerumen, bilateral: Secondary | ICD-10-CM

## 2022-08-21 DIAGNOSIS — I1A0 Resistant hypertension: Secondary | ICD-10-CM | POA: Diagnosis not present

## 2022-08-21 DIAGNOSIS — G4762 Sleep related leg cramps: Secondary | ICD-10-CM | POA: Diagnosis not present

## 2022-08-21 DIAGNOSIS — R42 Dizziness and giddiness: Secondary | ICD-10-CM

## 2022-08-21 MED ORDER — LOSARTAN POTASSIUM 100 MG PO TABS: 100.0000 mg | ORAL_TABLET | Freq: Every day | ORAL | 0 refills | Status: DC

## 2022-08-21 MED ORDER — B COMPLEX VITAMINS PO CAPS: 1.0000 | ORAL_CAPSULE | Freq: Every day | ORAL | 0 refills | Status: AC

## 2022-08-21 MED ORDER — LOSARTAN POTASSIUM 50 MG PO TABS: 50.0000 mg | ORAL_TABLET | Freq: Every day | ORAL | 0 refills | Status: DC

## 2022-08-21 MED ORDER — ACETIC ACID 2 % OT SOLN: 4.0000 [drp] | OTIC | 0 refills | Status: DC | PRN

## 2022-08-21 NOTE — Assessment & Plan Note (Signed)
Complaints of daily headaches and dizziness, although headaches seem to have improved Differential diagnosis: Benign positional vertigo, vestibular neuritis, Meniere's disease.  Patient has referred and referred to physical therapy for vestibular exercises as well as ENT.  These appointments are upcoming

## 2022-08-21 NOTE — Progress Notes (Signed)
Assessment/Plan:   Problem List Items Addressed This Visit       Cardiovascular and Mediastinum   Resistant hypertension    Reinforce adherence to current antihypertensive medications. Educate patient on the importance of home blood pressure monitoring and record-keeping. If blood pressure readings at home are consistently elevated, consider titrating losartan dosage upon reassessment.      Relevant Medications   b complex vitamins capsule   losartan (COZAAR) 50 MG tablet     Nervous and Auditory   Hearing loss of both ears due to cerumen impaction    As the patient still has partial cerumen impaction, continue acetic acid otic solution as previously prescribed. Schedule follow-up with ENT for comprehensive assessment and potential ear irrigation. If symptoms persist or worsen, consider referral for microsuction or other ear cleaning methods.      Relevant Medications   acetic acid 2 % otic solution     Other   Vertigo    Complaints of daily headaches and dizziness, although headaches seem to have improved Differential diagnosis: Benign positional vertigo, vestibular neuritis, Meniere's disease.  Patient has referred and referred to physical therapy for vestibular exercises as well as ENT.  These appointments are upcoming      Nocturnal leg cramps - Primary    Differential diagnosis:  Idiopathic Electrolyte imbalance Peripheral artery disease Plan:  Recommending a trial of vitamin B complex to potentially alleviate leg cramp symptoms. Promote stretching exercises before bedtime. Follow-up as needed       Medications Discontinued During This Encounter  Medication Reason   losartan (COZAAR) 50 MG tablet Reorder   losartan (COZAAR) 100 MG tablet       Subjective:  HPI: Encounter date: 08/21/2022  Stacy Huang is a 89 y.o. female who has Stage 3b chronic kidney disease (Stacy Huang); History of deep vein thrombosis (DVT) of lower extremity; Hypothyroidism;  Hyperlipidemia; Compression fracture of L1 vertebra (Stacy Huang); Dizziness and giddiness; Hearing loss of both ears due to cerumen impaction; Resistant hypertension; Dizziness; Vertigo; Frail elderly; Difficulty transferring from wheelchair to chair; Glaucoma of right eye; and Nocturnal leg cramps on their problem list..   She  has a past medical history of Acute hypoxemic respiratory failure due to COVID-19 [facility]) (02/27/2020), Essential hypertension (04/21/2021), Hypertension, Hypertensive urgency (02/27/2020), Shoulder fracture, right, and Thyroid disease.Marland Kitchen   CHIEF COMPLAINT: Stacy Huang reports persistent dizziness and ear fullness despite treatment.  HISTORY OF PRESENT ILLNESS:  Dizziness: The patient continues to experience daily episodes of dizziness, which vary in severity but are less intense than previously. She reports that dizziness and headaches previously occurred concurrently, but now she experiences only mild headaches. Historical treatment for presumed vertigo included meclizine and ENT consultation.  Cerumen Impaction: Stacy Huang has a history of hearing difficulty due to cerumen impaction. At this visit, there is still partial cerumen occlusion despite using acetic acid ear drops. She has an upcoming appointment with an ENT specialist.  Cerumen Impaction.  Auditory canal(s) of both ears are partially obstructed with cerumen. Patient counseled on removal of cerumen. Patient consented.  Partial cerumen was removed using gentle irrigation. Tympanic membranes are intact following the procedure.  Auditory canals are normal.  Patient tolerated procedure well.   Blood Pressure Management: The patient's blood pressure was elevated at the start of the visit but normalized to 138/84 mmHg upon recheck. Her current antihypertensive regimen includes amlodipine, losartan, pindolol, and hydralazine.  Nocturnal Leg Cramps: Stacy Huang mentions occasional cramps in her legs at night, which she manages  by  walking.  ROS:  Cardiology: Mildly elevated blood pressure, currently managed with medications.  Neurology: Reports of dizziness and mild intermittent headaches, no new weakness or difficulty swallowing observed.  Musculoskeletal: Notable for nocturnal leg cramps.  No past surgical history on file.  Outpatient Medications Prior to Visit  Medication Sig Dispense Refill   amLODipine (NORVASC) 10 MG tablet TAKE 1 TABLET(10 MG) BY MOUTH EVERY MORNING 90 tablet 1   hydrALAZINE (APRESOLINE) 25 MG tablet Take 1 tablet (25 mg total) by mouth 3 (three) times daily. 270 tablet 3   latanoprost (XALATAN) 0.005 % ophthalmic solution 1 drop at bedtime.     pindolol (VISKEN) 5 MG tablet Take 1 tablet (5 mg total) by mouth 2 (two) times daily. 180 tablet 3   losartan (COZAAR) 50 MG tablet Take by mouth.     No facility-administered medications prior to visit.    Family History  Problem Relation Age of Onset   Alzheimer's disease Mother    Hypertension Father    Other Sister        Died at birth   Cancer Brother        spinal   Heart attack Brother     Social History   Socioeconomic History   Marital status: Widowed    Spouse name: Not on file   Number of children: 1   Years of education: Not on file   Highest education level: Not on file  Occupational History   Not on file  Tobacco Use   Smoking status: Never    Passive exposure: Never   Smokeless tobacco: Never  Vaping Use   Vaping Use: Never used  Substance and Sexual Activity   Alcohol use: Never   Drug use: Never   Sexual activity: Not on file  Other Topics Concern   Not on file  Social History Narrative   Not on file   Social Determinants of Health   Financial Resource Strain: Low Risk  (08/07/2022)   Overall Financial Resource Strain (CARDIA)    Difficulty of Paying Living Expenses: Not hard at all  Food Insecurity: No Food Insecurity (08/07/2022)   Hunger Vital Sign    Worried About Running Out of Food in the Last  Year: Never true    Ran Out of Food in the Last Year: Never true  Transportation Needs: No Transportation Needs (08/07/2022)   PRAPARE - Hydrologist (Medical): No    Lack of Transportation (Non-Medical): No  Physical Activity: Inactive (08/07/2022)   Exercise Vital Sign    Days of Exercise per Week: 0 days    Minutes of Exercise per Session: 0 min  Stress: No Stress Concern Present (08/07/2022)   [place]    Feeling of Stress : Not at all  Social Connections: Moderately Isolated (08/07/2022)   Social Connection and Isolation Panel [NHANES]    Frequency of Communication with Friends and Family: Twice a week    Frequency of Social Gatherings with Friends and Family: Once a week    Attends Religious Services: 1 to 4 times per year    Active Member of [employer] or Organizations: No    Attends Archivist Meetings: Never    Marital Status: Widowed  Intimate Partner Violence: Not At Risk (08/07/2022)   Humiliation, Afraid, Rape, and Kick questionnaire    Fear of Current or Ex-Partner: No    Emotionally Abused: No    Physically  Abused: No    Sexually Abused: No                                                                                                 Objective:  Physical Exam: BP 138/82   Pulse 89   Temp 97.7 F (36.5 C) (Temporal)   Wt 142 lb 12.8 oz (64.8 kg)   SpO2 99%   BMI 26.12 kg/m     Physical Exam Constitutional:      General: She is not in acute distress.    Appearance: Normal appearance. She is not ill-appearing or toxic-appearing.     Comments: Patient is wheelchair-bound  HENT:     Head: Normocephalic and atraumatic.     Nose: Nose normal. No congestion.  Eyes:     General: No scleral icterus.    Extraocular Movements: Extraocular movements intact.  Cardiovascular:     Rate and Rhythm: Normal rate and regular rhythm.     Pulses: Normal pulses.     Heart  sounds: Normal heart sounds.  Pulmonary:     Effort: Pulmonary effort is normal. No respiratory distress.     Breath sounds: Normal breath sounds.  Abdominal:     General: Abdomen is flat. Bowel sounds are normal.     Palpations: Abdomen is soft.  Musculoskeletal:        General: Normal range of motion.  Lymphadenopathy:     Cervical: No cervical adenopathy.  Skin:    General: Skin is warm and dry.     Findings: No rash.  Neurological:     General: No focal deficit present.     Mental Status: She is alert and oriented to person, place, and time. Mental status is at baseline.  Psychiatric:        Mood and Affect: Mood normal.        Behavior: Behavior normal.        Thought Content: Thought content normal.        Judgment: Judgment normal.          Alesia Banda, MD, MS

## 2022-08-21 NOTE — Assessment & Plan Note (Addendum)
Reinforce adherence to current antihypertensive medications. Educate patient on the importance of home blood pressure monitoring and record-keeping. If blood pressure readings at home are consistently elevated, consider titrating losartan dosage upon reassessment.

## 2022-08-21 NOTE — Patient Instructions (Addendum)
For blood pressure, it was great today.  Please continue medications as before.  For earwax, try acetic acid drops.  For leg cramps, try B vitamin complex.

## 2022-08-21 NOTE — Assessment & Plan Note (Signed)
As the patient still has partial cerumen impaction, continue acetic acid otic solution as previously prescribed. Schedule follow-up with ENT for comprehensive assessment and potential ear irrigation. If symptoms persist or worsen, consider referral for microsuction or other ear cleaning methods.

## 2022-08-21 NOTE — Assessment & Plan Note (Signed)
Differential diagnosis:  Idiopathic Electrolyte imbalance Peripheral artery disease Plan:  Recommending a trial of vitamin B complex to potentially alleviate leg cramp symptoms. Promote stretching exercises before bedtime. Follow-up as needed

## 2022-08-31 ENCOUNTER — Other Ambulatory Visit: Payer: 59

## 2022-08-31 NOTE — Progress Notes (Signed)
   08/31/2022  Patient ID: Stacy Huang, female   DOB: July 16, 1920, 89 y.o.   MRN: [id number]  Subjective/Objective: HTN Telephone follow-up visit to check on home BP readings and verify patient received losartan 50mg  prescription to replace previous losartan/hctz therapy. -Patient stated pharmacy had not received new prescriptions from 08/21/22 appointment -Still taking previous regimen of:  hydralazine 25mg  TID and losartan/hctz 50/12.5 ONE-HALF tablet daily -Home BP this week:  167/76, 200/84, 166/79, 171/76, 168/79, 163/76  Assessment/Plan: HTN -Patient's preferred pharmacy in the system was [facility] in [place], so prescriptions were sent there; but patient was expecting at [facility]  [facility] drops, vitamin B, and losartan.  Updated [facility] as preferred [facility], per patient request. -Educated patient to start losartan 50mg  daily and STOP losartan/hctz  Follow-up:  Patient also states needing a refill of latanoprost, but it appears this was previously prescribed by [facility]. Will recommend contacting that provider for refills.  BP follow-up scheduled for next Friday.  Darlina Guys, PharmD, DPLA

## 2022-09-07 ENCOUNTER — Other Ambulatory Visit: Payer: 59

## 2022-09-07 NOTE — Progress Notes (Signed)
   09/07/2022  Patient ID: Stacy Huang, female   DOB: 1920/11/11, 89 y.o.   MRN: [id number]  Subjective/Objective: Telephone follow-up to discuss blood pressure control HTN -verified patient has received losartan 50mg  daily- she is now taking this instead of losartan/hctz -she also endorses adherence to hydrazlazine 25mg  TID, amlodipine 10mg  daily, and pindolol 5mg  BID -states she also has a bottle of losartan 100mg  picked up from [place] recently -recorded home BP's this week:  168/79, 171/76, 166/79, and 200/84  Assessment/Plan: HTN -remains uncontrolled -BP's reported are from home meter versus when therapy is out and records- requested to have therapy write down readings over the next two weeks -educated patient to put losartan 100mg  aside for now, so she will not get confused and take it along with 50mg  -if pressures remain elevated, could consider having patient stop losartan 50mg  and start 100mg  tablets she has on hand  Follow-up:  Telephone visit scheduled in 2 weeks to follow-up on home BP  Note: patient still requesting latanoprost refill; will consult Dr. Grandville Silos to see if he is willing to prescribe  Darlina Guys, PharmD, DPLA

## 2022-09-18 ENCOUNTER — Other Ambulatory Visit: Payer: Medicare Other

## 2022-09-21 ENCOUNTER — Telehealth: Payer: Self-pay

## 2022-09-21 ENCOUNTER — Other Ambulatory Visit: Payer: 59

## 2022-09-21 NOTE — Progress Notes (Signed)
   09/21/2022  Patient ID: Stacy Huang, female   DOB: [birth year], 89 y.o.   MRN: [id number]  First outreach attempt for scheduled telephone visit rescheduled for the afternoon because therapy was coming in the next 35 minutes.  Second outreach attempt for afternoon visit unsuccessful, but I did leave a message on son, Milton's, mobile phone with my direct line to call me back.    Will try to contact again next week to check on home BP readings if I do not hear back.  Lenna Gilford, PharmD, DPLA

## 2022-09-25 ENCOUNTER — Ambulatory Visit: Payer: 59

## 2022-09-25 ENCOUNTER — Ambulatory Visit: Payer: 59 | Admitting: Cardiology

## 2022-09-25 DIAGNOSIS — R9431 Abnormal electrocardiogram [ECG] [EKG]: Secondary | ICD-10-CM

## 2022-09-25 DIAGNOSIS — I10 Essential (primary) hypertension: Secondary | ICD-10-CM

## 2022-10-01 ENCOUNTER — Ambulatory Visit: Payer: Self-pay | Admitting: *Deleted

## 2022-10-01 ENCOUNTER — Other Ambulatory Visit: Payer: Self-pay | Admitting: Family Medicine

## 2022-10-01 ENCOUNTER — Ambulatory Visit (INDEPENDENT_AMBULATORY_CARE_PROVIDER_SITE_OTHER): Payer: 59 | Admitting: *Deleted

## 2022-10-01 ENCOUNTER — Telehealth: Payer: Self-pay | Admitting: *Deleted

## 2022-10-01 DIAGNOSIS — I1A0 Resistant hypertension: Secondary | ICD-10-CM

## 2022-10-01 DIAGNOSIS — H409 Unspecified glaucoma: Secondary | ICD-10-CM

## 2022-10-01 DIAGNOSIS — N1832 Chronic kidney disease, stage 3b: Secondary | ICD-10-CM

## 2022-10-01 MED ORDER — LATANOPROST 0.005 % OP SOLN
1.0000 [drp] | Freq: Every day | OPHTHALMIC | 0 refills | Status: AC
Start: 2022-10-01 — End: 2022-12-30

## 2022-10-01 NOTE — Telephone Encounter (Signed)
   CCM RN Visit Note   10/01/22 Name: ADILYNN BESSEY MRN: [id number]      DOB: 1920-11-21  Subjective: CAELI LINEHAN is a 89 y.o. year old female who is a primary care patient of Fanny Bien MD. The patient was referred to the Chronic Care Management team for assistance with care management needs subsequent to provider initiation of CCM services and plan of care.      An unsuccessful telephone outreach was attempted today to contact the patient about Chronic Care Management needs.    Plan:Telephone follow up appointment with care management team member scheduled for:  Will attempt again today  Irving Shows [facility], BSN RN Case Manager Yolanda Manges [phone]

## 2022-10-01 NOTE — Chronic Care Management (AMB) (Signed)
Chronic Care Management   CCM RN Visit Note  10/01/2022 Name: Stacy Huang MRN: [id number] DOB: 06-27-1920  Subjective: Stacy Huang is a 89 y.o. year old female who is a primary care patient of Garnette Gunner, MD. The patient was referred to the Chronic Care Management team for assistance with care management needs subsequent to provider initiation of CCM services and plan of care.    Today's Visit:  Engaged with patient by telephone for follow up visit.        Goals Addressed             This Visit's Progress    CCM (CHRONIC KIDNEY DISEASE) EXPECTED OUTCOME: MONITOR, SELF-MANAGE AND REDUCE SYMPTOMS OF CHRONIC KIDNEY DISEASE       Current Barriers:  Knowledge Deficits related to Chronic Kidney Disease management Chronic Disease Management support and education needs related to Chronic Kidney Disease Patient's son answered the phone, RN care manager spoke with patient and offered to speak with patient's son, patient did not give permission to speak with her son, states she felt comfortable answering all questions Patient reports she has difficulty hearing and has problems with wax buildup, has used ear drops stating it has helped some Patient reports she has not heard anything from ENT referral that was made, pt continues to have dizziness almost on a daily basis  Planned Interventions: Evaluation of current treatment plan related to chronic kidney disease self management and patient's adherence to plan as established by provider      Reviewed medications with patient and discussed importance of compliance    Advised patient, providing education and rationale, to monitor blood pressure daily and record, calling PCP for findings outside established parameters    Discussed complications of poorly controlled blood pressure such as heart disease, stroke, circulatory complications, vision complications, kidney impairment, sexual dysfunction    Advised patient to discuss vertigo,  change in health status with provider    RN care manager called Dr. [name] office (ENT) at [facility], per [name] she does not see the referral in their new system, states it is in their old system and pt has never been contacted, due to being on hold pt is no longer on the call, RN care manager made appointment for pt May 16 at 940 am and will call pt back to confirm if this appointment is ok Reviewed importance of asking for assistance as needed and do not ambulate if having vertigo Reviewed safety precautions  Symptom Management: Take medications as prescribed   Attend all scheduled provider appointments Call pharmacy for medication refills 3-7 days in advance of running out of medications Call provider office for new concerns or questions  Follow low sodium and heart healthy diet Read food labels, limit fast food RN care manager will contact you about appointment that has been scheduled for 10/25/22 at 940 am for ENT [name] fall prevention strategies: change position slowly, use assistive device such as walker or cane (per provider recommendations) when walking, keep walkways clear, have good lighting in room. It is important to contact your provider if you have any falls, maintain muscle strength/tone by exercise per provider recommendations.  Follow Up Plan: Telephone follow up appointment with care management team member scheduled for:  10/30/22 at 9 am       CCM (HYPERTENSION) EXPECTED OUTCOME: MONITOR, SELF-MANAGE AND REDUCE SYMPTOMS OF HYPERTENSION       Current Barriers:  Knowledge Deficits related to Hypertension management  Chronic Disease Management support and education needs related to Hypertension, diet No Advanced Directives in place-patient declines information Patient reports she lives with her son, pt is able to cook some and her son also helps cook, son provides oversight with medications, transportation and with any other assistance  pt may need Patient reports she has walker that she uses at home, and wheelchair she uses when she leaves home because she cannot walk long distances Patient reports she is seeing cardiologist [name] for help with managing blood pressure, pt reports she is checking blood pressure daily, states " readings have been good" Patient reports she is almost out of eye drops and would like refills to be sent to [facility] on [address] In [place]  Planned Interventions: Evaluation of current treatment plan related to hypertension self management and patient's adherence to plan as established by provider;   Reviewed medications with patient and discussed importance of compliance;  Advised patient, providing education and rationale, to monitor blood pressure daily and record, calling PCP for findings outside established parameters;  Advised patient to discuss any issues with blood pressure, medications with provider; Discussed complications of poorly controlled blood pressure such as heart disease, stroke, circulatory complications, vision complications, kidney impairment, sexual dysfunction;  Reinforced low sodium diet In basket message sent to primary care provider office requesting eye drops refill be sent to [facility] [address] [place]  Symptom Management: Take medications as prescribed   Attend all scheduled provider appointments Call pharmacy for medication refills 3-7 days in advance of running out of medications Call provider office for new concerns or questions  check blood pressure daily choose a place to take my blood pressure (home, clinic or office, retail store) write blood pressure results in a log or diary learn about high blood pressure keep a blood pressure log take blood pressure log to all doctor appointments call doctor for signs and symptoms of high blood pressure keep all doctor appointments take medications for blood pressure exactly as prescribed report new symptoms to  your doctor eat more whole grains, fruits and vegetables, lean meats and healthy fats Follow low sodium diet Message sent to your doctor requesting refill for eye drops be sent to [facility] [address] [place]  Follow Up Plan: Telephone follow up appointment with care management team member scheduled for:  10/30/22 at 9 am          Plan:Telephone follow up appointment with care management team member scheduled for:  10/30/22 at 9 am  Irving Shows [facility], BSN RN Case Manager [employer] [phone]

## 2022-10-01 NOTE — Patient Instructions (Signed)
Please call the care guide team at [phone] if you need to cancel or reschedule your appointment.   If you are experiencing a Mental Health or Behavioral Health Crisis or need someone to talk to, please call the Suicide and Crisis Lifeline: 988 call the [place] National Suicide Prevention Lifeline: (707) 480-7073 or TTY: 630-232-4677 TTY 801 018 2364) to talk to a trained counselor call 1-800-273-TALK (toll free, 24 hour hotline) go to [facility] [address], [place] [id number]) call 911   Following is a copy of the CCM Program Consent:  CCM service includes personalized support from designated clinical staff supervised by the physician, including individualized plan of care and coordination with other care providers 24/7 contact phone numbers for assistance for urgent and routine care needs. Service will only be billed when office clinical staff spend 20 minutes or more in a month to coordinate care. Only one practitioner may furnish and bill the service in a calendar month. The patient may stop CCM services at amy time (effective at the end of the month) by phone call to the office staff. The patient will be responsible for cost sharing (co-pay) or up to 20% of the service fee (after annual deductible is met)  Following is a copy of your full provider care plan:   Goals Addressed             This Visit's Progress    CCM (CHRONIC KIDNEY DISEASE) EXPECTED OUTCOME: MONITOR, SELF-MANAGE AND REDUCE SYMPTOMS OF CHRONIC KIDNEY DISEASE       Current Barriers:  Knowledge Deficits related to Chronic Kidney Disease management Chronic Disease Management support and education needs related to Chronic Kidney Disease Patient's son answered the phone, RN care manager spoke with patient and offered to speak with patient's son, patient did not give permission to speak with her son, states she felt comfortable answering all questions Patient reports she has  difficulty hearing and has problems with wax buildup, has used ear drops stating it has helped some Patient reports she has not heard anything from ENT referral that was made, pt continues to have dizziness almost on a daily basis  Planned Interventions: Evaluation of current treatment plan related to chronic kidney disease self management and patient's adherence to plan as established by provider      Reviewed medications with patient and discussed importance of compliance    Advised patient, providing education and rationale, to monitor blood pressure daily and record, calling PCP for findings outside established parameters    Discussed complications of poorly controlled blood pressure such as heart disease, stroke, circulatory complications, vision complications, kidney impairment, sexual dysfunction    Advised patient to discuss vertigo, change in health status with provider    RN care manager called Dr. [name] office (ENT) at [facility], per [name] she does not see the referral in their new system, states it is in their old system and pt has never been contacted, due to being on hold pt is no longer on the call, RN care manager made appointment for pt May 16 at 940 am and will call pt back to confirm if this appointment is ok Reviewed importance of asking for assistance as needed and do not ambulate if having vertigo Reviewed safety precautions  Symptom Management: Take medications as prescribed   Attend all scheduled provider appointments Call pharmacy for medication refills 3-7 days in advance of running out of medications Call provider office for new concerns or questions  Follow  low sodium and heart healthy diet Read food labels, limit fast food RN care manager will contact you about appointment that has been scheduled for 10/25/22 at 940 am for ENT [name] fall prevention strategies: change position slowly, use assistive device such as walker or  cane (per provider recommendations) when walking, keep walkways clear, have good lighting in room. It is important to contact your provider if you have any falls, maintain muscle strength/tone by exercise per provider recommendations.  Follow Up Plan: Telephone follow up appointment with care management team member scheduled for:  10/30/22 at 9 am       CCM (HYPERTENSION) EXPECTED OUTCOME: MONITOR, SELF-MANAGE AND REDUCE SYMPTOMS OF HYPERTENSION       Current Barriers:  Knowledge Deficits related to Hypertension management Chronic Disease Management support and education needs related to Hypertension, diet No Advanced Directives in place-patient declines information Patient reports she lives with her son, pt is able to cook some and her son also helps cook, son provides oversight with medications, transportation and with any other assistance pt may need Patient reports she has walker that she uses at home, and wheelchair she uses when she leaves home because she cannot walk long distances Patient reports she is seeing cardiologist [name] for help with managing blood pressure, pt reports she is checking blood pressure daily, states " readings have been good" Patient reports she is almost out of eye drops and would like refills to be sent to [facility] on [address] In [place]  Planned Interventions: Evaluation of current treatment plan related to hypertension self management and patient's adherence to plan as established by provider;   Reviewed medications with patient and discussed importance of compliance;  Advised patient, providing education and rationale, to monitor blood pressure daily and record, calling PCP for findings outside established parameters;  Advised patient to discuss any issues with blood pressure, medications with provider; Discussed complications of poorly controlled blood pressure such as heart disease, stroke, circulatory complications, vision complications, kidney  impairment, sexual dysfunction;  Reinforced low sodium diet In basket message sent to primary care provider office requesting eye drops refill be sent to [facility] [address] [place]  Symptom Management: Take medications as prescribed   Attend all scheduled provider appointments Call pharmacy for medication refills 3-7 days in advance of running out of medications Call provider office for new concerns or questions  check blood pressure daily choose a place to take my blood pressure (home, clinic or office, retail store) write blood pressure results in a log or diary learn about high blood pressure keep a blood pressure log take blood pressure log to all doctor appointments call doctor for signs and symptoms of high blood pressure keep all doctor appointments take medications for blood pressure exactly as prescribed report new symptoms to your doctor eat more whole grains, fruits and vegetables, lean meats and healthy fats Follow low sodium diet Message sent to your doctor requesting refill for eye drops be sent to [facility] [address] [place]  Follow Up Plan: Telephone follow up appointment with care management team member scheduled for:  10/30/22 at 9 am          The patient verbalized understanding of instructions, educational materials, and care plan provided today and DECLINED offer to receive copy of patient instructions, educational materials, and care plan.  Telephone follow up appointment with care management team member scheduled for:  10/30/22 at 9 am

## 2022-10-01 NOTE — Chronic Care Management (AMB) (Signed)
Chronic Care Management   CCM RN Visit Note  10/01/2022 Name: Stacy Huang MRN: [id number] DOB: 09-14-1920  Subjective: Stacy Huang is a 89 y.o. year old female who is a primary care patient of Garnette Gunner, MD. The patient was referred to the Chronic Care Management team for assistance with care management needs subsequent to provider initiation of CCM services and plan of care.    Today's Visit:  Engaged with patient by telephone for follow up visit.        Goals Addressed             This Visit's Progress    CCM (CHRONIC KIDNEY DISEASE) EXPECTED OUTCOME: MONITOR, SELF-MANAGE AND REDUCE SYMPTOMS OF CHRONIC KIDNEY DISEASE       Current Barriers:  Knowledge Deficits related to Chronic Kidney Disease management Chronic Disease Management support and education needs related to Chronic Kidney Disease Patient's son answered the phone, RN care manager spoke with patient and offered to speak with patient's son, patient did not give permission to speak with her son, states she felt comfortable answering all questions Patient reports she has difficulty hearing and has problems with wax buildup, has used ear drops stating it has helped some Patient reports she has not heard anything from ENT referral that was made, pt continues to have dizziness almost on a daily basis RN care manager called pt back, spoke with patient and son to inform of appointment with ENT, pt and son verbalizes understanding  Planned Interventions: Evaluation of current treatment plan related to chronic kidney disease self management and patient's adherence to plan as established by provider      Reviewed medications with patient and discussed importance of compliance    Advised patient, providing education and rationale, to monitor blood pressure daily and record, calling PCP for findings outside established parameters    Discussed complications of poorly controlled blood pressure such as heart disease,  stroke, circulatory complications, vision complications, kidney impairment, sexual dysfunction    Advised patient to discuss vertigo, change in health status with provider    RN care manager called Dr. [name] office (ENT) at [facility], per [name] she does not see the referral in their new system, states it is in their old system and pt has never been contacted, due to being on hold pt is no longer on the call, RN care manager made appointment for pt May 16 at 940 am and will call pt back to confirm if this appointment is ok Reviewed importance of asking for assistance as needed and do not ambulate if having vertigo Reviewed safety precautions RN care manager gave information to pt and son for ENT appointment on 10/25/22 at 940 am with [name] contact number [phone], address [address]  [place]  Symptom Management: Take medications as prescribed   Attend all scheduled provider appointments Call pharmacy for medication refills 3-7 days in advance of running out of medications Call provider office for new concerns or questions  Follow low sodium and heart healthy diet Read food labels, limit fast food RN care manager will contact you about appointment that has been scheduled for 10/25/22 at 940 am for ENT [name] fall prevention strategies: change position slowly, use assistive device such as walker or cane (per provider recommendations) when walking, keep walkways clear, have good lighting in room. It is important to contact your provider if you have any falls, maintain muscle strength/tone by exercise per  provider recommendations. Your appointment with ENT [name] is on 10/25/22 at 940 am, phone number [phone],  address [address] [place]  Follow Up Plan: Telephone follow up appointment with care management team member scheduled for:  10/30/22 at 9 am          Plan:Telephone follow up appointment  with care management team member scheduled for:  10/23/22 at 9 am  Irving Shows [facility], BSN RN Case Manager [employer] [phone]

## 2022-10-01 NOTE — Patient Instructions (Signed)
Please call the care guide team at [phone] if you need to cancel or reschedule your appointment.   If you are experiencing a Mental Health or Behavioral Health Crisis or need someone to talk to, please call the Suicide and Crisis Lifeline: 988 call the [place] National Suicide Prevention Lifeline: 657 356 7094 or TTY: 306-505-1320 TTY 705-868-7534) to talk to a trained counselor call 1-800-273-TALK (toll free, 24 hour hotline) go to [facility] [address], [place] [phone]) call 911   Following is a copy of the CCM Program Consent:  CCM service includes personalized support from designated clinical staff supervised by the physician, including individualized plan of care and coordination with other care providers 24/7 contact phone numbers for assistance for urgent and routine care needs. Service will only be billed when office clinical staff spend 20 minutes or more in a month to coordinate care. Only one practitioner may furnish and bill the service in a calendar month. The patient may stop CCM services at amy time (effective at the end of the month) by phone call to the office staff. The patient will be responsible for cost sharing (co-pay) or up to 20% of the service fee (after annual deductible is met)  Following is a copy of your full provider care plan:   Goals Addressed             This Visit's Progress    CCM (CHRONIC KIDNEY DISEASE) EXPECTED OUTCOME: MONITOR, SELF-MANAGE AND REDUCE SYMPTOMS OF CHRONIC KIDNEY DISEASE       Current Barriers:  Knowledge Deficits related to Chronic Kidney Disease management Chronic Disease Management support and education needs related to Chronic Kidney Disease Patient's son answered the phone, RN care manager spoke with patient and offered to speak with patient's son, patient did not give permission to speak with her son, states she felt comfortable answering all questions Patient reports she has  difficulty hearing and has problems with wax buildup, has used ear drops stating it has helped some Patient reports she has not heard anything from ENT referral that was made, pt continues to have dizziness almost on a daily basis RN care manager called pt back, spoke with patient and son to inform of appointment with ENT, pt and son verbalizes understanding  Planned Interventions: Evaluation of current treatment plan related to chronic kidney disease self management and patient's adherence to plan as established by provider      Reviewed medications with patient and discussed importance of compliance    Advised patient, providing education and rationale, to monitor blood pressure daily and record, calling PCP for findings outside established parameters    Discussed complications of poorly controlled blood pressure such as heart disease, stroke, circulatory complications, vision complications, kidney impairment, sexual dysfunction    Advised patient to discuss vertigo, change in health status with provider    RN care manager called Dr. [name] office (ENT) at [facility], per [name] she does not see the referral in their new system, states it is in their old system and pt has never been contacted, due to being on hold pt is no longer on the call, RN care manager made appointment for pt May 16 at 940 am and will call pt back to confirm if this appointment is ok Reviewed importance of asking for assistance as needed and do not ambulate if having vertigo Reviewed safety precautions RN care manager gave information to pt and son for ENT appointment on 10/25/22 at 940  am with [name] contact number [phone], address [address]  [place]  Symptom Management: Take medications as prescribed   Attend all scheduled provider appointments Call pharmacy for medication refills 3-7 days in advance of running out of medications Call provider office for new concerns  or questions  Follow low sodium and heart healthy diet Read food labels, limit fast food RN care manager will contact you about appointment that has been scheduled for 10/25/22 at 940 am for ENT [name] fall prevention strategies: change position slowly, use assistive device such as walker or cane (per provider recommendations) when walking, keep walkways clear, have good lighting in room. It is important to contact your provider if you have any falls, maintain muscle strength/tone by exercise per provider recommendations. Your appointment with ENT [name] is on 10/25/22 at 940 am, phone number [phone],  address [address] [place]  Follow Up Plan: Telephone follow up appointment with care management team member scheduled for:  10/30/22 at 9 am          The patient verbalized understanding of instructions, educational materials, and care plan provided today and DECLINED offer to receive copy of patient instructions, educational materials, and care plan.  Telephone follow up appointment with care management team member scheduled for:  10/30/22 at 9 am

## 2022-10-03 ENCOUNTER — Telehealth: Payer: Self-pay

## 2022-10-03 NOTE — Progress Notes (Signed)
   10/03/2022  Patient ID: Stacy Huang, female   DOB: Aug 23, 1920, 89 y.o.   MRN: [id number]  Subjective/Objective: HTN -Telephone visit to follow-up on home BP readings -Patient/son state therapy checked yesterday, and did not provide value; but they stated her blood pressure was "good" -Patient/son unable to provide any other home readings but endorse continuing to check several times a week -Last clinic reading was 138/82, which is at goal of <140/90 based on patient age  Assessment/Plan:  HTN -Continue to check home BP regularly and record -Continue current medication regimen of amlodipine  daily, hydralazine  TID, losartan  daily, and pindolol  BID  Follow-up: None scheduled at this time, but can as needed per PCP  Lenna Gilford, PharmD, DPLA

## 2022-10-04 ENCOUNTER — Encounter: Payer: Self-pay | Admitting: Cardiology

## 2022-10-04 ENCOUNTER — Ambulatory Visit: Payer: 59 | Admitting: Cardiology

## 2022-10-04 VITALS — BP 160/80 | HR 65 | Resp 16 | Ht 62.0 in | Wt 144.4 lb

## 2022-10-04 DIAGNOSIS — N1832 Chronic kidney disease, stage 3b: Secondary | ICD-10-CM

## 2022-10-04 DIAGNOSIS — I10 Essential (primary) hypertension: Secondary | ICD-10-CM

## 2022-10-04 MED ORDER — ISOSORBIDE DINITRATE 30 MG PO TABS
30.0000 mg | ORAL_TABLET | Freq: Three times a day (TID) | ORAL | 3 refills | Status: DC
Start: 2022-10-04 — End: 2022-12-04

## 2022-10-04 NOTE — Progress Notes (Signed)
Primary Physician/Referring:  Garnette Gunner, MD  Patient ID: Stacy Huang, female    DOB: 1921/03/10, 89 y.o.   MRN: [id number]  No chief complaint on file.   HPI:    Stacy Huang  is a 89 y.o.  African-American female patient with hypertension, hyperlipidemia, chronic stage IIIb kidney disease, hypothyroidism presents for management of hypertension.   Patient states that she is presently doing well, she has chronic headache and also mild dizziness but she has not had any fall.  She brings all her medications to the office.  She is accompanied by her son.  Past Medical History:  Diagnosis Date   Acute hypoxemic respiratory failure due to COVID-19 02/27/2020   Essential hypertension 04/21/2021   Hypertension    Hypertensive urgency 02/27/2020   Shoulder fracture, right    Thyroid disease    History reviewed. No pertinent surgical history. Family History  Problem Relation Age of Onset   Alzheimer's disease Mother    Hypertension Father    Other Sister        Died at birth   Cancer Brother        spinal   Heart attack Brother     Social History   Tobacco Use   Smoking status: Never    Passive exposure: Never   Smokeless tobacco: Never  Substance Use Topics   Alcohol use: Never   Marital Status: Widowed  ROS  Review of Systems  Cardiovascular:  Negative for chest pain, dyspnea on exertion and leg swelling.  Neurological:  Positive for dizziness and headaches.   Objective      10/04/2022   10:11 AM 10/04/2022   10:00 AM 08/21/2022   11:24 AM  Vitals with BMI  Height  5\' 2"    Weight  144 lbs 6 oz   BMI  26.4   Systolic 160 194 409  Diastolic 80 94 82  Pulse 65 63    Today's Vitals   10/04/22 1000 10/04/22 1011  BP: (!) 194/94 (!) 160/80  Pulse: 63 65  Resp: 16   SpO2: 100% 100%  Weight: 144 lb 6.4 oz (65.5 kg)   Height: 5\' 2"  (1.575 m)    Body mass index is 26.41 kg/m.  Orthostatic VS for the past 72 hrs (Last 3 readings):  Patient  Position BP Location Cuff Size  10/04/22 1011 Sitting Left Arm Normal  10/04/22 1000 Sitting Left Arm Normal   Physical Exam Neck:     Vascular: No JVD.  Cardiovascular:     Rate and Rhythm: Normal rate and regular rhythm.     Pulses: Intact distal pulses.     Heart sounds: Normal heart sounds. No murmur heard.    No gallop.  Pulmonary:     Effort: Pulmonary effort is normal.     Breath sounds: Normal breath sounds.  Abdominal:     General: Bowel sounds are normal.     Palpations: Abdomen is soft.  Musculoskeletal:     Right lower leg: No edema.     Left lower leg: No edema.    Laboratory examination:   Recent Labs    11/27/21 1004 12/25/21 0923  NA 140 142  K 4.4 4.3  CL 103 105  CO2 22 21  GLUCOSE 121* 105*  BUN 53* 39*  CREATININE 2.27* 2.19*  CALCIUM 9.6 9.4       Latest Ref Rng & Units 12/25/2021    9:23 AM 11/27/2021   10:04 AM 04/23/2021  5:32 AM  CMP  Glucose 70 - 99 mg/dL 161  096  045   BUN 10 - 36 mg/dL 39  53  29   Creatinine 0.57 - 1.00 mg/dL 4.09  8.11  9.14   Sodium 134 - 144 mmol/L 142  140  140   Potassium 3.5 - 5.2 mmol/L 4.3  4.4  3.6   Chloride 96 - 106 mmol/L 105  103  109   CO2 20 - 29 mmol/L Calcium 8.7 - 10.3 mg/dL 9.4  9.6  8.7       Latest Ref Rng & Units 04/25/2021    4:52 AM 04/24/2021    5:03 AM 04/23/2021    5:32 AM  CBC  WBC 4.0 - 10.5 K/uL 11.6  13.2  11.6   Hemoglobin 12.0 - 15.0 g/dL 8.9  9.3  8.8   Hematocrit 36.0 - 46.0 % 26.8  27.8  26.6   Platelets 150 - 400 K/uL 312  320  275    Radiology:   Cardiac Studies:   PCV ECHOCARDIOGRAM COMPLETE 09/25/2022  Narrative Echocardiogram 09/25/2022: Normal LV systolic function with visual EF 60-65%. Left ventricle cavity is normal in size. Normal left ventricular wall thickness. Normal global wall motion. Doppler evidence of grade II (pseudonormal) diastolic dysfunction, elevated LAP. Calculated EF 66%. Left atrial cavity is mildly dilated at 37.6  ml/m^2. Trace mitral regurgitation. Mild calcification of the mitral valve annulus. Structurally normal tricuspid valve.  Mild tricuspid regurgitation. Mild pulmonary hypertension. RVSP measures 37 mmHg. No prior available for comparison.       EKG:   EKG 10/04/2022: Normal sinus rhythm at rate of 63 bpm, normal EKG.  Compared to 03/26/2022, no significant change.   Medications and allergies   Allergies  Allergen Reactions   Lactose Nausea Only and Other (See Comments)    GI Upset- Upsets the stomach   Lactose Intolerance (Gi) Other (See Comments)    Upsets the stomach    Current Outpatient Medications:    hydrALAZINE (APRESOLINE) 25 MG tablet, Take 1 tablet (25 mg total) by mouth 3 (three) times daily., Disp: 270 tablet, Rfl: 3   isosorbide dinitrate (ISORDIL) 30 MG tablet, Take 1 tablet (30 mg total) by mouth 3 (three) times daily., Disp: 240 tablet, Rfl: 3   losartan (COZAAR) 50 MG tablet, Take 1 tablet (50 mg total) by mouth daily., Disp: 90 tablet, Rfl: 0   pindolol (VISKEN) 5 MG tablet, Take 1 tablet (5 mg total) by mouth 2 (two) times daily., Disp: 180 tablet, Rfl: 3   amLODipine (NORVASC) 10 MG tablet, TAKE 1 TABLET(10 MG) BY MOUTH EVERY MORNING, Disp: 90 tablet, Rfl: 1   b complex vitamins capsule, Take 1 capsule by mouth daily., Disp: 90 capsule, Rfl: 0   latanoprost (XALATAN) 0.005 % ophthalmic solution, Place 1 drop into the right eye at bedtime., Disp: 7.5 mL, Rfl: 0   Assessment     ICD-10-CM   1. Primary hypertension  I10 EKG 12-Lead    isosorbide dinitrate (ISORDIL) 30 MG tablet    2. Stage 3b chronic kidney disease  N18.32 Basic metabolic panel      Medications Discontinued During This Encounter  Medication Reason   losartan (COZAAR) 100 MG tablet Dose change      Orders Placed This Encounter  Procedures   Basic metabolic panel   EKG 12-Lead   Meds ordered this encounter  Medications   isosorbide dinitrate (ISORDIL) 30 MG tablet  Sig: Take 1  tablet (30 mg total) by mouth 3 (three) times daily.    Dispense:  240 tablet    Refill:  3    Recommendations:   Stacy Huang is a 89 y.o.  African-American female patient with hypertension, hyperlipidemia, chronic stage IIIb kidney disease, hypothyroidism presents for management of hypertension.   1. Primary hypertension Patient's blood pressure was well-controlled previously, she had responded well to isosorbide dinitrate however it was discontinued by PCP probably related to headaches, however on questioning, patient has chronic headaches.  She is having headache today.  Advised him to use Tylenol.,  Will reinitiate ISDN 30 mg 3 times daily. - EKG 12-Lead - isosorbide dinitrate (ISORDIL) 30 MG tablet; Take 1 tablet (30 mg total) by mouth 3 (three) times daily.  Dispense: 240 tablet; Refill: 3  2. Stage 3b chronic kidney disease (HCC) Patient's losartan has been reduced to 50 mg daily from 100 mg daily in view of stage IIIb chronic kidney disease, she needs BMP.  Office visit in 2 months for follow-up of hypertension.  She is 89 years of age, and being very careful with titration of her medications.  Her son is present.  - Basic metabolic panel   Yates Decamp, MD, [facility] 10/04/2022, 10:48 AM Office: [phone]

## 2022-10-09 DIAGNOSIS — I1A0 Resistant hypertension: Secondary | ICD-10-CM

## 2022-10-11 ENCOUNTER — Telehealth: Payer: Self-pay | Admitting: Family Medicine

## 2022-10-11 NOTE — Telephone Encounter (Signed)
Contacted Stacy Huang to schedule their annual wellness visit. Appointment made for 10/19/22.  Stacy Huang AWV direct phone # [phone]

## 2022-10-19 ENCOUNTER — Ambulatory Visit (INDEPENDENT_AMBULATORY_CARE_PROVIDER_SITE_OTHER): Payer: 59

## 2022-10-19 VITALS — Ht 62.0 in | Wt 144.0 lb

## 2022-10-19 DIAGNOSIS — Z0000 Encounter for general adult medical examination without abnormal findings: Secondary | ICD-10-CM | POA: Diagnosis not present

## 2022-10-19 NOTE — Progress Notes (Signed)
I connected with  Stacy Huang on 10/19/22 by a audio enabled telemedicine application and verified that I am speaking with the correct person using two identifiers.  Patient Location: Home  Provider Location: Office/Clinic  I discussed the limitations of evaluation and management by telemedicine. The patient expressed understanding and agreed to proceed.  Subjective:   Stacy Huang is a 89 y.o. female who presents for an Initial Medicare Annual Wellness Visit.  Review of Systems     Cardiac Risk Factors include: advanced age (>55men, >95 women);dyslipidemia;hypertension     Objective:    Today's Vitals   10/19/22 1127  Weight: 144 lb (65.3 kg)  Height: 5\' 2"  (1.575 m)   Body mass index is 26.34 kg/m.     10/19/2022   11:30 AM 08/07/2022    1:03 PM 04/21/2021    6:24 PM 04/21/2021   12:28 PM 01/23/2021    6:30 PM 01/23/2021   11:51 AM 02/27/2020    5:33 PM  Advanced Directives  Does Patient Have a Medical Advance Directive? No No No No No No   Does patient want to make changes to medical advance directive?     No - Patient declined    Would patient like information on creating a medical advance directive?  No - Patient declined No - Patient declined  No - Patient declined  No - Patient declined    Current Medications (verified) Outpatient Encounter Medications as of 10/19/2022  Medication Sig   amLODipine (NORVASC) 10 MG tablet TAKE 1 TABLET(10 MG) BY MOUTH EVERY MORNING   hydrALAZINE (APRESOLINE) 25 MG tablet Take 1 tablet (25 mg total) by mouth 3 (three) times daily.   isosorbide dinitrate (ISORDIL) 30 MG tablet Take 1 tablet (30 mg total) by mouth 3 (three) times daily.   latanoprost (XALATAN) 0.005 % ophthalmic solution Place 1 drop into the right eye at bedtime.   losartan (COZAAR) 50 MG tablet Take 1 tablet (50 mg total) by mouth daily.   pindolol (VISKEN) 5 MG tablet Take 1 tablet (5 mg total) by mouth 2 (two) times daily.   b complex vitamins capsule Take 1  capsule by mouth daily. (Patient not taking: Reported on 10/19/2022)   No facility-administered encounter medications on file as of 10/19/2022.    Allergies (verified) Lactose and Lactose intolerance (gi)   History: Past Medical History:  Diagnosis Date   Acute hypoxemic respiratory failure due to COVID-19 (HCC) 02/27/2020   Essential hypertension 04/21/2021   Hypertension    Hypertensive urgency 02/27/2020   Shoulder fracture, right    Thyroid disease    History reviewed. No pertinent surgical history. Family History  Problem Relation Age of Onset   Alzheimer's disease Mother    Hypertension Father    Other Sister        Died at birth   Cancer Brother        spinal   Heart attack Brother    Social History   Socioeconomic History   Marital status: Widowed    Spouse name: Not on file   Number of children: 1   Years of education: Not on file   Highest education level: Not on file  Occupational History   Not on file  Tobacco Use   Smoking status: Never    Passive exposure: Never   Smokeless tobacco: Never  Vaping Use   Vaping Use: Never used  Substance and Sexual Activity   Alcohol use: Never   Drug use: Never  Sexual activity: Not on file  Other Topics Concern   Not on file  Social History Narrative   Not on file   Social Determinants of Health   Financial Resource Strain: Low Risk  (10/19/2022)   Overall Financial Resource Strain (CARDIA)    Difficulty of Paying Living Expenses: Not hard at all  Food Insecurity: No Food Insecurity (10/19/2022)   Hunger Vital Sign    Worried About Running Out of Food in the Last Year: Never true    Ran Out of Food in the Last Year: Never true  Transportation Needs: No Transportation Needs (10/19/2022)   PRAPARE - Administrator, Civil Service (Medical): No    Lack of Transportation (Non-Medical): No  Physical Activity: Inactive (10/19/2022)   Exercise Vital Sign    Days of Exercise per Week: 0 days    Minutes  of Exercise per Session: 0 min  Stress: No Stress Concern Present (10/19/2022)   [employer] of Occupational Health - Occupational Stress Questionnaire    Feeling of Stress : Not at all  Social Connections: Moderately Isolated (08/07/2022)   Social Connection and Isolation Panel [NHANES]    Frequency of Communication with Friends and Family: Twice a week    Frequency of Social Gatherings with Friends and Family: Once a week    Attends Religious Services: 1 to 4 times per year    Active Member of [employer] or Organizations: No    Attends Banker Meetings: Never    Marital Status: Widowed    Tobacco Counseling Counseling given: Not Answered   Clinical Intake:  Pre-visit preparation completed: Yes  Pain : No/denies pain     Nutritional Status: BMI 25 -29 Overweight Nutritional Risks: None Diabetes: No  How often do you need to have someone help you when you read instructions, pamphlets, or other written materials from your doctor or pharmacy?: 1 - Never  Diabetic? no  Interpreter Needed?: No  Information entered by :: NAllen LPN   Activities of Daily Living    10/19/2022   11:31 AM  In your present state of health, do you have any difficulty performing the following activities:  Hearing? 1  Vision? 1  Comment can't see out of left eye  Difficulty concentrating or making decisions? 0  Walking or climbing stairs? 1  Dressing or bathing? 0  Doing errands, shopping? 1  Comment does not drive  Preparing Food and eating ? N  Using the Toilet? N  In the past six months, have you accidently leaked urine? Y  Do you have problems with loss of bowel control? N  Managing your Medications? Y  Comment son supervises  Managing your Finances? N  Housekeeping or managing your Housekeeping? N    Patient Care Team: Garnette Gunner, MD as PCP - General (Family Medicine) Audrie Gallus, RN as [facility] Care Management  Indicate any recent  Medical Services you may have received from other than [facility] providers in the past year (date may be approximate).     Assessment:   This is a routine wellness examination for Stacy Huang.  Hearing/Vision screen Vision Screening - Comments:: No regular eye exams  Dietary issues and exercise activities discussed: Current Exercise Habits: The patient does not participate in regular exercise at present   Goals Addressed             This Visit's Progress    Patient Stated       10/19/2022, no goals  Depression Screen    10/19/2022   11:31 AM 08/07/2022   12:53 PM  PHQ 2/9 Scores  PHQ - 2 Score 0 0    Fall Risk    10/19/2022   11:31 AM 08/07/2022   12:55 PM  Fall Risk   Falls in the past year? 0 0  Number falls in past yr: 0   Injury with Fall? 0   Risk for fall due to : Medication side effect   Follow up Falls prevention discussed;Education provided;Falls evaluation completed     FALL RISK PREVENTION PERTAINING TO THE HOME:  Any stairs in or around the home? No  If so, are there any without handrails? N/a Home free of loose throw rugs in walkways, pet beds, electrical cords, etc? Yes  Adequate lighting in your home to reduce risk of falls? Yes   ASSISTIVE DEVICES UTILIZED TO PREVENT FALLS:  Life alert? No  Use of a cane, walker or w/c? Yes  Grab bars in the bathroom? No  Shower chair or bench in shower? Yes  Elevated toilet seat or a handicapped toilet? No   TIMED UP AND GO:  Was the test performed? No .       Cognitive Function:        10/19/2022   11:33 AM  6CIT Screen  What Year? 0 points  What month? 0 points  What time? 0 points  Count back from 20 4 points  Months in reverse 4 points  Repeat phrase 10 points  Total Score 18 points    Immunizations Immunization History  Administered Date(s) Administered   Influenza-Unspecified 02/09/2022   Moderna Sars-Covid-2 Vaccination 09/10/2019, 10/07/2019    TDAP status: Due, Education has been  provided regarding the importance of this vaccine. Advised may receive this vaccine at local pharmacy or Health Dept. Aware to provide a copy of the vaccination record if obtained from local pharmacy or Health Dept. Verbalized acceptance and understanding.  Flu Vaccine status: Up to date  Pneumococcal vaccine status: Due, Education has been provided regarding the importance of this vaccine. Advised may receive this vaccine at local pharmacy or Health Dept. Aware to provide a copy of the vaccination record if obtained from local pharmacy or Health Dept. Verbalized acceptance and understanding.  Covid-19 vaccine status: Completed vaccines  Qualifies for Shingles Vaccine? Yes   Zostavax completed No   Shingrix Completed?: No.    Education has been provided regarding the importance of this vaccine. Patient has been advised to call insurance company to determine out of pocket expense if they have not yet received this vaccine. Advised may also receive vaccine at local pharmacy or Health Dept. Verbalized acceptance and understanding.  Screening Tests Health Maintenance  Topic Date Due   Medicare Annual Wellness (AWV)  Never done   DTaP/Tdap/Td (1 - Tdap) Never done   Zoster Vaccines- Shingrix (1 of 2) Never done   Pneumonia Vaccine 29+ Years old (1 of 1 - PCV) Never done   DEXA SCAN  Never done   COVID-19 Vaccine (3 - 2023-24 season) 02/09/2022   INFLUENZA VACCINE  01/10/2023   HPV VACCINES  Aged Out    Health Maintenance  Health Maintenance Due  Topic Date Due   Medicare Annual Wellness (AWV)  Never done   DTaP/Tdap/Td (1 - Tdap) Never done   Zoster Vaccines- Shingrix (1 of 2) Never done   Pneumonia Vaccine 68+ Years old (1 of 1 - PCV) Never done   DEXA SCAN  Never done  COVID-19 Vaccine (3 - 2023-24 season) 02/09/2022    Colorectal cancer screening: No longer required.   Mammogram status: No longer required due to age.  Bone Density status: due  Lung Cancer Screening: (Low  Dose CT Chest recommended if Age 3-80 years, 30 pack-year currently smoking OR have quit w/in 15years.) does not qualify.   Lung Cancer Screening Referral: no  Additional Screening:  Hepatitis C Screening: does not qualify;   Vision Screening: Recommended annual ophthalmology exams for early detection of glaucoma and other disorders of the eye. Is the patient up to date with their annual eye exam?  No  Who is the provider or what is the name of the office in which the patient attends annual eye exams? none If pt is not established with a provider, would they like to be referred to a provider to establish care? No .   Dental Screening: Recommended annual dental exams for proper oral hygiene  Community Resource Referral / Chronic Care Management: CRR required this visit?  No   CCM required this visit?  No      Plan:     I have personally reviewed and noted the following in the patient's chart:   Medical and social history Use of alcohol, tobacco or illicit drugs  Current medications and supplements including opioid prescriptions. Patient is not currently taking opioid prescriptions. Functional ability and status Nutritional status Physical activity Advanced directives List of other physicians Hospitalizations, surgeries, and ER visits in previous 12 months Vitals Screenings to include cognitive, depression, and falls Referrals and appointments  In addition, I have reviewed and discussed with patient certain preventive protocols, quality metrics, and best practice recommendations. A written personalized care plan for preventive services as well as general preventive health recommendations were provided to patient.     Barb Merino, LPN   08/16/6576   Nurse Notes: none  Due to this being a virtual visit, the after visit summary with patients personalized plan was offered to patient via mail or my-chart.to pick up at office at next visit

## 2022-10-19 NOTE — Patient Instructions (Signed)
Stacy Huang , Thank you for taking time to come for your Medicare Wellness Visit. I appreciate your ongoing commitment to your health goals. Please review the following plan we discussed and let me know if I can assist you in the future.   These are the goals we discussed:  Goals      CCM (CHRONIC KIDNEY DISEASE) EXPECTED OUTCOME: MONITOR, SELF-MANAGE AND REDUCE SYMPTOMS OF CHRONIC KIDNEY DISEASE     Current Barriers:  Knowledge Deficits related to Chronic Kidney Disease management Chronic Disease Management support and education needs related to Chronic Kidney Disease Patient's son answered the phone, RN care manager spoke with patient and offered to speak with patient's son, patient did not give permission to speak with her son, states she felt comfortable answering all questions Patient reports she has difficulty hearing and has problems with wax buildup, has used ear drops stating it has helped some Patient reports she has not heard anything from ENT referral that was made, pt continues to have dizziness almost on a daily basis RN care manager called pt back, spoke with patient and son to inform of appointment with ENT, pt and son verbalizes understanding  Planned Interventions: Evaluation of current treatment plan related to chronic kidney disease self management and patient's adherence to plan as established by provider      Reviewed medications with patient and discussed importance of compliance    Advised patient, providing education and rationale, to monitor blood pressure daily and record, calling PCP for findings outside established parameters    Discussed complications of poorly controlled blood pressure such as heart disease, stroke, circulatory complications, vision complications, kidney impairment, sexual dysfunction    Advised patient to discuss vertigo, change in health status with provider    RN care manager called Dr. [name] office (ENT) at [facility], per [name] she does not see the referral in their new system, states it is in their old system and pt has never been contacted, due to being on hold pt is no longer on the call, RN care manager made appointment for pt May 16 at 940 am and will call pt back to confirm if this appointment is ok Reviewed importance of asking for assistance as needed and do not ambulate if having vertigo Reviewed safety precautions RN care manager gave information to pt and son for ENT appointment on 10/25/22 at 940 am with [name] contact number [phone], address [address]  [place]  Symptom Management: Take medications as prescribed   Attend all scheduled provider appointments Call pharmacy for medication refills 3-7 days in advance of running out of medications Call provider office for new concerns or questions  Follow low sodium and heart healthy diet Read food labels, limit fast food RN care manager will contact you about appointment that has been scheduled for 10/25/22 at 940 am for ENT [name] fall prevention strategies: change position slowly, use assistive device such as walker or cane (per provider recommendations) when walking, keep walkways clear, have good lighting in room. It is important to contact your provider if you have any falls, maintain muscle strength/tone by exercise per provider recommendations. Your appointment with ENT [name] is on 10/25/22 at 940 am, phone number [phone],  address [address] [place]  Follow Up Plan: Telephone follow up appointment with care management team member scheduled for:  10/30/22 at 9 am       CCM (HYPERTENSION) EXPECTED  OUTCOME: MONITOR, SELF-MANAGE AND REDUCE SYMPTOMS OF HYPERTENSION     Current Barriers:  Knowledge Deficits related to Hypertension management Chronic Disease Management support and education needs related to Hypertension, diet No Advanced Directives in place-patient  declines information Patient reports she lives with her son, pt is able to cook some and her son also helps cook, son provides oversight with medications, transportation and with any other assistance pt may need Patient reports she has walker that she uses at home, and wheelchair she uses when she leaves home because she cannot walk long distances Patient reports she is seeing cardiologist [name] for help with managing blood pressure, pt reports she is checking blood pressure daily, states " readings have been good" Patient reports she is almost out of eye drops and would like refills to be sent to [facility] on [address] In [place]  Planned Interventions: Evaluation of current treatment plan related to hypertension self management and patient's adherence to plan as established by provider;   Reviewed medications with patient and discussed importance of compliance;  Advised patient, providing education and rationale, to monitor blood pressure daily and record, calling PCP for findings outside established parameters;  Advised patient to discuss any issues with blood pressure, medications with provider; Discussed complications of poorly controlled blood pressure such as heart disease, stroke, circulatory complications, vision complications, kidney impairment, sexual dysfunction;  Reinforced low sodium diet In basket message sent to primary care provider office requesting eye drops refill be sent to [facility] [address] [place]  Symptom Management: Take medications as prescribed   Attend all scheduled provider appointments Call pharmacy for medication refills 3-7 days in advance of running out of medications Call provider office for new concerns or questions  check blood pressure daily choose a place to take my blood pressure (home, clinic or office, retail store) write blood pressure results in a log or diary learn about high blood pressure keep a blood pressure log take blood pressure log  to all doctor appointments call doctor for signs and symptoms of high blood pressure keep all doctor appointments take medications for blood pressure exactly as prescribed report new symptoms to your doctor eat more whole grains, fruits and vegetables, lean meats and healthy fats Follow low sodium diet Message sent to your doctor requesting refill for eye drops be sent to [facility] [address] [place]  Follow Up Plan: Telephone follow up appointment with care management team member scheduled for:  10/30/22 at 9 am       Patient Stated     10/19/2022, no goals        This is a list of the screening recommended for you and due dates:  Health Maintenance  Topic Date Due   DTaP/Tdap/Td vaccine (1 - Tdap) Never done   Zoster (Shingles) Vaccine (1 of 2) Never done   Pneumonia Vaccine (1 of 1 - PCV) Never done   DEXA scan (bone density measurement)  Never done   COVID-19 Vaccine (3 - 2023-24 season) 02/09/2022   Flu Shot  01/10/2023   Medicare Annual Wellness Visit  10/19/2023   HPV Vaccine  Aged Out    Advanced directives: Advance directive discussed with you today.   Conditions/risks identified: none  Next appointment: Follow up in one year for your annual wellness visit    Preventive Care 65 Years and Older, Female Preventive care refers to lifestyle choices and visits with your health care provider that can promote health and wellness.  What does preventive care include? A yearly physical exam. This is also called an annual well check. Dental exams once or twice a year. Routine eye exams. Ask your health care provider how often you should have your eyes checked. Personal lifestyle choices, including: Daily care of your teeth and gums. Regular physical activity. Eating a healthy diet. Avoiding tobacco and drug use. Limiting alcohol use. Practicing safe sex. Taking low-dose aspirin every day. Taking vitamin and mineral supplements as recommended by your health care  provider. What happens during an annual well check? The services and screenings done by your health care provider during your annual well check will depend on your age, overall health, lifestyle risk factors, and family history of disease. Counseling  Your health care provider may ask you questions about your: Alcohol use. Tobacco use. Drug use. Emotional well-being. Home and relationship well-being. Sexual activity. Eating habits. History of falls. Memory and ability to understand (cognition). Work and work Astronomer. Reproductive health. Screening  You may have the following tests or measurements: Height, weight, and BMI. Blood pressure. Lipid and cholesterol levels. These may be checked every 5 years, or more frequently if you are over 72 years old. Skin check. Lung cancer screening. You may have this screening every year starting at age 58 if you have a 30-pack-year history of smoking and currently smoke or have quit within the past 15 years. Fecal occult blood test (FOBT) of the stool. You may have this test every year starting at age 77. Flexible sigmoidoscopy or colonoscopy. You may have a sigmoidoscopy every 5 years or a colonoscopy every 10 years starting at age 6. Hepatitis C blood test. Hepatitis B blood test. Sexually transmitted disease (STD) testing. Diabetes screening. This is done by checking your blood sugar (glucose) after you have not eaten for a while (fasting). You may have this done every 1-3 years. Bone density scan. This is done to screen for osteoporosis. You may have this done starting at age 68. Mammogram. This may be done every 1-2 years. Talk to your health care provider about how often you should have regular mammograms. Talk with your health care provider about your test results, treatment options, and if necessary, the need for more tests. Vaccines  Your health care provider may recommend certain vaccines, such as: Influenza vaccine. This is  recommended every year. Tetanus, diphtheria, and acellular pertussis (Tdap, Td) vaccine. You may need a Td booster every 10 years. Zoster vaccine. You may need this after age 87. Pneumococcal 13-valent conjugate (PCV13) vaccine. One dose is recommended after age 47. Pneumococcal polysaccharide (PPSV23) vaccine. One dose is recommended after age 75. Talk to your health care provider about which screenings and vaccines you need and how often you need them. This information is not intended to replace advice given to you by your health care provider. Make sure you discuss any questions you have with your health care provider. Document Released: 06/24/2015 Document Revised: 02/15/2016 Document Reviewed: 03/29/2015 Elsevier Interactive Patient Education  2017 [employer].  Fall Prevention in the Home Falls can cause injuries. They can happen to people of all ages. There are many things you can do to make your home safe and to help prevent falls. What can I do on the outside of my home? Regularly fix the edges of walkways and driveways and fix any cracks. Remove anything that might make you trip as you walk through a door, such as a raised step or threshold. Trim any bushes or trees on the  path to your home. Use bright outdoor lighting. Clear any walking paths of anything that might make someone trip, such as rocks or tools. Regularly check to see if handrails are loose or broken. Make sure that both sides of any steps have handrails. Any raised decks and porches should have guardrails on the edges. Have any leaves, snow, or ice cleared regularly. Use sand or salt on walking paths during winter. Clean up any spills in your garage right away. This includes oil or grease spills. What can I do in the bathroom? Use night lights. Install grab bars by the toilet and in the tub and shower. Do not use towel bars as grab bars. Use non-skid mats or decals in the tub or shower. If you need to sit down in  the shower, use a plastic, non-slip stool. Keep the floor dry. Clean up any water that spills on the floor as soon as it happens. Remove soap buildup in the tub or shower regularly. Attach bath mats securely with double-sided non-slip rug tape. Do not have throw rugs and other things on the floor that can make you trip. What can I do in the bedroom? Use night lights. Make sure that you have a light by your bed that is easy to reach. Do not use any sheets or blankets that are too big for your bed. They should not hang down onto the floor. Have a firm chair that has side arms. You can use this for support while you get dressed. Do not have throw rugs and other things on the floor that can make you trip. What can I do in the kitchen? Clean up any spills right away. Avoid walking on wet floors. Keep items that you use a lot in easy-to-reach places. If you need to reach something above you, use a strong step stool that has a grab bar. Keep electrical cords out of the way. Do not use floor polish or wax that makes floors slippery. If you must use wax, use non-skid floor wax. Do not have throw rugs and other things on the floor that can make you trip. What can I do with my stairs? Do not leave any items on the stairs. Make sure that there are handrails on both sides of the stairs and use them. Fix handrails that are broken or loose. Make sure that handrails are as long as the stairways. Check any carpeting to make sure that it is firmly attached to the stairs. Fix any carpet that is loose or worn. Avoid having throw rugs at the top or bottom of the stairs. If you do have throw rugs, attach them to the floor with carpet tape. Make sure that you have a light switch at the top of the stairs and the bottom of the stairs. If you do not have them, ask someone to add them for you. What else can I do to help prevent falls? Wear shoes that: Do not have high heels. Have rubber bottoms. Are comfortable  and fit you well. Are closed at the toe. Do not wear sandals. If you use a stepladder: Make sure that it is fully opened. Do not climb a closed stepladder. Make sure that both sides of the stepladder are locked into place. Ask someone to hold it for you, if possible. Clearly mark and make sure that you can see: Any grab bars or handrails. First and last steps. Where the edge of each step is. Use tools that help you move around (mobility  aids) if they are needed. These include: Canes. Walkers. Scooters. Crutches. Turn on the lights when you go into a dark area. Replace any light bulbs as soon as they burn out. Set up your furniture so you have a clear path. Avoid moving your furniture around. If any of your floors are uneven, fix them. If there are any pets around you, be aware of where they are. Review your medicines with your doctor. Some medicines can make you feel dizzy. This can increase your chance of falling. Ask your doctor what other things that you can do to help prevent falls. This information is not intended to replace advice given to you by your health care provider. Make sure you discuss any questions you have with your health care provider. Document Released: 03/24/2009 Document Revised: 11/03/2015 Document Reviewed: 07/02/2014 Elsevier Interactive Patient Education  2017 [employer].

## 2022-10-25 DIAGNOSIS — H6123 Impacted cerumen, bilateral: Secondary | ICD-10-CM | POA: Diagnosis not present

## 2022-10-25 DIAGNOSIS — R42 Dizziness and giddiness: Secondary | ICD-10-CM | POA: Diagnosis not present

## 2022-10-30 ENCOUNTER — Ambulatory Visit (INDEPENDENT_AMBULATORY_CARE_PROVIDER_SITE_OTHER): Payer: 59 | Admitting: *Deleted

## 2022-10-30 DIAGNOSIS — N1832 Chronic kidney disease, stage 3b: Secondary | ICD-10-CM

## 2022-10-30 DIAGNOSIS — I1A0 Resistant hypertension: Secondary | ICD-10-CM

## 2022-10-30 NOTE — Chronic Care Management (AMB) (Signed)
Chronic Care Management   CCM RN Visit Note  10/30/2022 Name: Stacy Huang MRN: [id number] DOB: December 16, 1920  Subjective: Stacy Huang is a 89 y.o. year old female who is a primary care patient of Stacy Gunner, MD. The patient was referred to the Chronic Care Management team for assistance with care management needs subsequent to provider initiation of CCM services and plan of care.    Today's Visit:  Engaged with patient by telephone for follow up visit.        Goals Addressed             This Visit's Progress    CCM (CHRONIC KIDNEY DISEASE) EXPECTED OUTCOME: MONITOR, SELF-MANAGE AND REDUCE SYMPTOMS OF CHRONIC KIDNEY DISEASE       Current Barriers:  Knowledge Deficits related to Chronic Kidney Disease management Chronic Disease Management support and education needs related to Chronic Kidney Disease Patient's son answered the phone, RN care manager spoke with both patient and son Patient reports she recently saw ENT [name] and "they cleaned out my ears", pt states this helped some but she continues to have chronic dizziness  Planned Interventions: Evaluation of current treatment plan related to chronic kidney disease self management and patient's adherence to plan as established by provider      Reviewed medications with patient and discussed importance of compliance    Advised patient, providing education and rationale, to monitor blood pressure daily and record, calling PCP for findings outside established parameters    Discussed complications of poorly controlled blood pressure such as heart disease, stroke, circulatory complications, vision complications, kidney impairment, sexual dysfunction    Advised patient to discuss vertigo, change in health status with provider    Reinforced importance of asking for assistance as needed and do not ambulate if having vertigo Reinforced safety precautions Reviewed all upcoming scheduled appointments  Symptom  Management: Take medications as prescribed   Attend all scheduled provider appointments Call pharmacy for medication refills 3-7 days in advance of running out of medications Call provider office for new concerns or questions  Follow low sodium and heart healthy diet Read food labels, limit fast food fall prevention strategies: change position slowly, use assistive device such as walker or cane (per provider recommendations) when walking, keep walkways clear, have good lighting in room. It is important to contact your provider if you have any falls, maintain muscle strength/tone by exercise per provider recommendations.  Follow Up Plan: Telephone follow up appointment with care management team member scheduled for:  01/04/23 at 9 am       CCM (HYPERTENSION) EXPECTED OUTCOME: MONITOR, SELF-MANAGE AND REDUCE SYMPTOMS OF HYPERTENSION       Current Barriers:  Knowledge Deficits related to Hypertension management Chronic Disease Management support and education needs related to Hypertension, diet No Advanced Directives in place-patient declines information Patient reports she lives with her son, pt is able to cook some and her son also helps cook, son provides oversight with medications, transportation and with any other assistance pt may need Patient reports she has walker that she uses at home, and wheelchair she uses when she leaves home because she cannot walk long distances Patient reports she is seeing cardiologist [name] for help with managing blood pressure, pt reports she is checking blood pressure several times per week, states " readings have been good" Patient reports she has all medications including eye drops  Planned Interventions: Evaluation of current treatment plan related to hypertension self management and patient's  adherence to plan as established by provider;   Provided education to patient re: stroke prevention, s/s of heart attack and stroke; Reviewed medications with  patient and discussed importance of compliance;  Advised patient, providing education and rationale, to monitor blood pressure daily and record, calling PCP for findings outside established parameters;  Advised patient to discuss any issues with blood pressure, medications with provider; Reviewed low sodium diet  Symptom Management: Take medications as prescribed   Attend all scheduled provider appointments Call pharmacy for medication refills 3-7 days in advance of running out of medications Call provider office for new concerns or questions  check blood pressure daily choose a place to take my blood pressure (home, clinic or office, retail store) write blood pressure results in a log or diary learn about high blood pressure keep a blood pressure log take blood pressure log to all doctor appointments call doctor for signs and symptoms of high blood pressure keep all doctor appointments take medications for blood pressure exactly as prescribed report new symptoms to your doctor eat more whole grains, fruits and vegetables, lean meats and healthy fats Follow low sodium diet Read food labels for sodium content  Follow Up Plan: Telephone follow up appointment with care management team member scheduled for:  01/04/23 at 9 am          Plan:Telephone follow up appointment with care management team member scheduled for:  01/04/23 at 9 am  Irving Shows [facility], BSN RN Case Manager [facility] [place] [phone]

## 2022-10-30 NOTE — Patient Instructions (Signed)
Please call the care guide team at [phone] if you need to cancel or reschedule your appointment.   If you are experiencing a Mental Health or Behavioral Health Crisis or need someone to talk to, please call the Suicide and Crisis Lifeline: 988 call the [place] National Suicide Prevention Lifeline: 617-836-8504 or TTY: 856 412 1454 TTY 216-699-9242) to talk to a trained counselor call 1-800-273-TALK (toll free, 24 hour hotline) go to [facility] [address], [place] [phone]) call 911   Following is a copy of the CCM Program Consent:  CCM service includes personalized support from designated clinical staff supervised by the physician, including individualized plan of care and coordination with other care providers 24/7 contact phone numbers for assistance for urgent and routine care needs. Service will only be billed when office clinical staff spend 20 minutes or more in a month to coordinate care. Only one practitioner may furnish and bill the service in a calendar month. The patient may stop CCM services at amy time (effective at the end of the month) by phone call to the office staff. The patient will be responsible for cost sharing (co-pay) or up to 20% of the service fee (after annual deductible is met)  Following is a copy of your full provider care plan:   Goals Addressed             This Visit's Progress    CCM (CHRONIC KIDNEY DISEASE) EXPECTED OUTCOME: MONITOR, SELF-MANAGE AND REDUCE SYMPTOMS OF CHRONIC KIDNEY DISEASE       Current Barriers:  Knowledge Deficits related to Chronic Kidney Disease management Chronic Disease Management support and education needs related to Chronic Kidney Disease Patient's son answered the phone, RN care manager spoke with both patient and son Patient reports she recently saw ENT [name] and "they cleaned out my ears", pt states this helped some but she continues to have chronic  dizziness  Planned Interventions: Evaluation of current treatment plan related to chronic kidney disease self management and patient's adherence to plan as established by provider      Reviewed medications with patient and discussed importance of compliance    Advised patient, providing education and rationale, to monitor blood pressure daily and record, calling PCP for findings outside established parameters    Discussed complications of poorly controlled blood pressure such as heart disease, stroke, circulatory complications, vision complications, kidney impairment, sexual dysfunction    Advised patient to discuss vertigo, change in health status with provider    Reinforced importance of asking for assistance as needed and do not ambulate if having vertigo Reinforced safety precautions Reviewed all upcoming scheduled appointments  Symptom Management: Take medications as prescribed   Attend all scheduled provider appointments Call pharmacy for medication refills 3-7 days in advance of running out of medications Call provider office for new concerns or questions  Follow low sodium and heart healthy diet Read food labels, limit fast food fall prevention strategies: change position slowly, use assistive device such as walker or cane (per provider recommendations) when walking, keep walkways clear, have good lighting in room. It is important to contact your provider if you have any falls, maintain muscle strength/tone by exercise per provider recommendations.  Follow Up Plan: Telephone follow up appointment with care management team member scheduled for:  01/04/23 at 9 am       CCM (HYPERTENSION) EXPECTED OUTCOME: MONITOR, SELF-MANAGE AND REDUCE SYMPTOMS OF HYPERTENSION       Current Barriers:  Knowledge Deficits related  to Hypertension management Chronic Disease Management support and education needs related to Hypertension, diet No Advanced Directives in place-patient declines  information Patient reports she lives with her son, pt is able to cook some and her son also helps cook, son provides oversight with medications, transportation and with any other assistance pt may need Patient reports she has walker that she uses at home, and wheelchair she uses when she leaves home because she cannot walk long distances Patient reports she is seeing cardiologist [name] for help with managing blood pressure, pt reports she is checking blood pressure several times per week, states " readings have been good" Patient reports she has all medications including eye drops  Planned Interventions: Evaluation of current treatment plan related to hypertension self management and patient's adherence to plan as established by provider;   Provided education to patient re: stroke prevention, s/s of heart attack and stroke; Reviewed medications with patient and discussed importance of compliance;  Advised patient, providing education and rationale, to monitor blood pressure daily and record, calling PCP for findings outside established parameters;  Advised patient to discuss any issues with blood pressure, medications with provider; Reviewed low sodium diet  Symptom Management: Take medications as prescribed   Attend all scheduled provider appointments Call pharmacy for medication refills 3-7 days in advance of running out of medications Call provider office for new concerns or questions  check blood pressure daily choose a place to take my blood pressure (home, clinic or office, retail store) write blood pressure results in a log or diary learn about high blood pressure keep a blood pressure log take blood pressure log to all doctor appointments call doctor for signs and symptoms of high blood pressure keep all doctor appointments take medications for blood pressure exactly as prescribed report new symptoms to your doctor eat more whole grains, fruits and vegetables, lean meats and  healthy fats Follow low sodium diet Read food labels for sodium content  Follow Up Plan: Telephone follow up appointment with care management team member scheduled for:  01/04/23 at 9 am          The patient verbalized understanding of instructions, educational materials, and care plan provided today and DECLINED offer to receive copy of patient instructions, educational materials, and care plan.  Telephone follow up appointment with care management team member scheduled for:  01/04/23 at 9 am

## 2022-11-09 DIAGNOSIS — I129 Hypertensive chronic kidney disease with stage 1 through stage 4 chronic kidney disease, or unspecified chronic kidney disease: Secondary | ICD-10-CM

## 2022-11-09 DIAGNOSIS — N1832 Chronic kidney disease, stage 3b: Secondary | ICD-10-CM

## 2022-11-21 ENCOUNTER — Encounter: Payer: Self-pay | Admitting: Family Medicine

## 2022-11-21 ENCOUNTER — Ambulatory Visit (INDEPENDENT_AMBULATORY_CARE_PROVIDER_SITE_OTHER): Payer: 59 | Admitting: Family Medicine

## 2022-11-21 VITALS — BP 146/84 | HR 80 | Temp 97.5°F

## 2022-11-21 DIAGNOSIS — R519 Headache, unspecified: Secondary | ICD-10-CM | POA: Diagnosis not present

## 2022-11-21 DIAGNOSIS — R5383 Other fatigue: Secondary | ICD-10-CM | POA: Diagnosis not present

## 2022-11-21 DIAGNOSIS — N184 Chronic kidney disease, stage 4 (severe): Secondary | ICD-10-CM

## 2022-11-21 DIAGNOSIS — M25561 Pain in right knee: Secondary | ICD-10-CM

## 2022-11-21 DIAGNOSIS — G8929 Other chronic pain: Secondary | ICD-10-CM

## 2022-11-21 DIAGNOSIS — I1A0 Resistant hypertension: Secondary | ICD-10-CM | POA: Diagnosis not present

## 2022-11-21 DIAGNOSIS — Z780 Asymptomatic menopausal state: Secondary | ICD-10-CM | POA: Diagnosis not present

## 2022-11-21 LAB — COMPREHENSIVE METABOLIC PANEL
ALT: 15 U/L (ref 0–35)
AST: 24 U/L (ref 0–37)
Albumin: 4.2 g/dL (ref 3.5–5.2)
Alkaline Phosphatase: 50 U/L (ref 39–117)
BUN: 39 mg/dL — ABNORMAL HIGH (ref 6–23)
CO2: 24 mEq/L (ref 19–32)
Calcium: 9.5 mg/dL (ref 8.4–10.5)
Chloride: 107 mEq/L (ref 96–112)
Creatinine, Ser: 2.17 mg/dL — ABNORMAL HIGH (ref 0.40–1.20)
GFR: 18.33 mL/min — ABNORMAL LOW (ref >60.00)
Glucose, Bld: 90 mg/dL (ref 70–99)
Potassium: 4.9 mEq/L (ref 3.5–5.1)
Sodium: 140 mEq/L (ref 135–145)
Total Bilirubin: 0.5 mg/dL (ref 0.2–1.2)
Total Protein: 7.3 g/dL (ref 6.0–8.3)

## 2022-11-21 LAB — VITAMIN B12: Vitamin B-12: 545 pg/mL (ref 211–911)

## 2022-11-21 LAB — CBC
HCT: 36.1 % (ref 36.0–46.0)
Hemoglobin: 11.7 g/dL — ABNORMAL LOW (ref 12.0–15.0)
MCHC: 32.4 g/dL (ref 30.0–36.0)
MCV: 92 fl (ref 78.0–100.0)
Platelets: 281 10*3/uL (ref 150.0–400.0)
RBC: 3.93 Mil/uL (ref 3.87–5.11)
RDW: 13.6 % (ref 11.5–15.5)
WBC: 8.2 10*3/uL (ref 4.0–10.5)

## 2022-11-21 LAB — TSH: TSH: 13.12 u[IU]/mL — ABNORMAL HIGH (ref 0.35–5.50)

## 2022-11-21 LAB — C-REACTIVE PROTEIN: CRP: <1 mg/dL (ref 0.5–20.0)

## 2022-11-21 LAB — VITAMIN D 25 HYDROXY (VIT D DEFICIENCY, FRACTURES): VITD: 46.52 ng/mL (ref 30.00–100.00)

## 2022-11-21 LAB — SEDIMENTATION RATE: Sed Rate: 48 mm/hr — ABNORMAL HIGH (ref 0–30)

## 2022-11-21 MED ORDER — DICLOFENAC SODIUM 1 % EX GEL
4.0000 g | Freq: Four times a day (QID) | CUTANEOUS | 3 refills | Status: DC | PRN
Start: 2022-11-21 — End: 2024-02-28

## 2022-11-21 NOTE — Progress Notes (Signed)
Assessment/Plan:   Problem List Items Addressed This Visit       Cardiovascular and Mediastinum   Resistant hypertension    Mildly elevated blood pressure (146/84 mmHg) likely secondary to pain.  Plan: Continue current medications  Monitor blood pressure at follow-up visits. Address underlying pain to potentially alleviate blood pressure.      Relevant Orders   Ambulatory referral to Nephrology     Genitourinary   Stage 4 chronic kidney disease (HCC) - Primary   Relevant Orders   Vit D  25 hydroxy (rtn osteoporosis monitoring) (Completed)     Other   Other fatigue    Suspected multifactorial etiology, possibly related to chronic pain, advanced age, and underlying kidney disease.  Plan: Referral to nephrologist for evaluation of CKD (stage 4). Comprehensive blood work including CBC, CMP, inflammatory markers (CRP, ferritin, ESR), vitamin levels (B12, D), and iron studies. Urinalysis to further evaluate renal function.      Relevant Orders   Ambulatory referral to Nephrology   C-reactive protein (Completed)   CBC (Completed)   Comprehensive metabolic panel (Completed)   Iron, TIBC and Ferritin Panel (Completed)   TSH (Completed)   Vit D  25 hydroxy (rtn osteoporosis monitoring) (Completed)   Vitamin B12 (Completed)   Sedimentation rate (Completed)   Microalbumin / creatinine urine ratio   Urinalysis w microscopic + reflex cultur   Chronic nonintractable headache   Postmenopausal estrogen deficiency   Relevant Orders   DG Bone Density   Vit D  25 hydroxy (rtn osteoporosis monitoring) (Completed)   Chronic pain of right knee    Chronic right knee pain with tricompartmental degenerative changes indicative of arthritis.  Differential diagnosis: Primary osteoarthritis Possible fracture (pending X-ray results)  Plan: Order X-ray to rule out fractures. Initiate Voltaren gel 1% to apply 4 times a day as needed for pain. Consider referral to orthopedics if no  improvement with current pain management plan. Evaluate physical therapy options, either in-home or at a physical therapy center. Monitor effectiveness and side effects of the current pain management regimen.      Relevant Medications   diclofenac Sodium (VOLTAREN) 1 % GEL   Other Relevant Orders   DG Knee Complete 4 Views Right    There are no discontinued medications.  Return in about 1 week (around 11/28/2022) for BP, knee pain .    Subjective:   Encounter date: 11/21/2022  ARIYAHNA Huang is a 89 y.o. female who has Other fatigue; Stage 3b chronic kidney disease (HCC); History of deep vein thrombosis (DVT) of lower extremity; Hypothyroidism; Hyperlipidemia; Compression fracture of L1 vertebra (HCC); Dizziness and giddiness; Hearing loss of both ears due to cerumen impaction; Resistant hypertension; Dizziness; Vertigo; Frail elderly; Difficulty transferring from wheelchair to chair; Glaucoma of right eye; Nocturnal leg cramps; Stage 4 chronic kidney disease (HCC); Chronic nonintractable headache; Postmenopausal estrogen deficiency; and Chronic pain of right knee on their problem list..   She  has a past medical history of Acute hypoxemic respiratory failure due to COVID-19 [facility]) (02/27/2020), Essential hypertension (04/21/2021), Hypertension, Hypertensive urgency (02/27/2020), Shoulder fracture, right, and Thyroid disease..   Chief Complaint: Patient presents for management of right knee pain and monitoring of blood pressure.  History of Present Illness:  Right Knee Pain. Patient reports chronic right knee pain that has been worsening over the years. No recent injury reported. Pain is exacerbated by standing and walking. Patient uses a walker, occasionally a wheelchair. No recent falls reported. Tenderness noted on the right  outside of the knee. Previous imaging showed tricompartmental degenerative changes indicative of arthritis.  Current pain management includes Tylenol and BC powder  for headaches. Discussed risks of excessive aspirin use.   Blood Pressure. Blood pressure reading was 146/84 mmHg. Patient's current blood pressure medications include isosorbide mononitrate 30 mg three times a day, pindolol 5 mg twice daily, hydralazine 25 mg 3 times daily, amlodipine 10 mg daily, losartan 50 mg daily recently restarted by [name].   Fatigue. Patient reports low energy levels but denies chest pain or shortness of breath. Fatigue is suspected to be multifactorial, potentially related to chronic pain and frailty given the patient's age (89 years old).   CKD 4.  Referral to a nephrologist planned due to concurrent chronic kidney disease, previous GFR noted at 18.  Review of Systems  Constitutional:  Positive for malaise/fatigue. Negative for chills, diaphoresis, fever and weight loss.  HENT:  Negative for congestion, ear discharge, ear pain and hearing loss.   Eyes:  Negative for blurred vision, double vision, photophobia, pain, discharge and redness.  Respiratory:  Negative for cough, sputum production, shortness of breath and wheezing.   Cardiovascular:  Negative for chest pain and palpitations.  Gastrointestinal:  Negative for abdominal pain, blood in stool, constipation, diarrhea, heartburn, melena, nausea and vomiting.  Genitourinary:  Negative for dysuria, flank pain, frequency, hematuria and urgency.  Musculoskeletal:  Positive for joint pain (Right knee). Negative for myalgias.  Skin:  Negative for itching and rash.  Neurological:  Positive for dizziness (Improved status post cerumen removal from ENT) and weakness (Chronic and generalized, not worsening). Negative for tingling, tremors, speech change, seizures, loss of consciousness and headaches.  Psychiatric/Behavioral:  Negative for depression, hallucinations, memory loss, substance abuse and suicidal ideas. The patient does not have insomnia.   All other systems reviewed and are negative.   No past surgical history  on file.  Outpatient Medications Prior to Visit  Medication Sig Dispense Refill   amLODipine (NORVASC) 10 MG tablet TAKE 1 TABLET(10 MG) BY MOUTH EVERY MORNING 90 tablet 1   hydrALAZINE (APRESOLINE) 25 MG tablet Take 1 tablet (25 mg total) by mouth 3 (three) times daily. 270 tablet 3   isosorbide dinitrate (ISORDIL) 30 MG tablet Take 1 tablet (30 mg total) by mouth 3 (three) times daily. 240 tablet 3   latanoprost (XALATAN) 0.005 % ophthalmic solution Place 1 drop into the right eye at bedtime. 7.5 mL 0   losartan (COZAAR) 50 MG tablet Take 1 tablet (50 mg total) by mouth daily. 90 tablet 0   pindolol (VISKEN) 5 MG tablet Take 1 tablet (5 mg total) by mouth 2 (two) times daily. 180 tablet 3   No facility-administered medications prior to visit.    Family History  Problem Relation Age of Onset   Alzheimer's disease Mother    Hypertension Father    Other Sister        Died at birth   Cancer Brother        spinal   Heart attack Brother     Social History   Socioeconomic History   Marital status: Widowed    Spouse name: Not on file   Number of children: 1   Years of education: Not on file   Highest education level: Not on file  Occupational History   Not on file  Tobacco Use   Smoking status: Never    Passive exposure: Never   Smokeless tobacco: Never  Vaping Use   Vaping Use: Never  used  Substance and Sexual Activity   Alcohol use: Never   Drug use: Never   Sexual activity: Not on file  Other Topics Concern   Not on file  Social History Narrative   Not on file   Social Determinants of Health   Financial Resource Strain: Low Risk  (10/19/2022)   Overall Financial Resource Strain (CARDIA)    Difficulty of Paying Living Expenses: Not hard at all  Food Insecurity: No Food Insecurity (10/19/2022)   Hunger Vital Sign    Worried About Running Out of Food in the Last Year: Never true    Ran Out of Food in the Last Year: Never true  Transportation Needs: No Transportation  Needs (10/19/2022)   PRAPARE - Administrator, Civil Service (Medical): No    Lack of Transportation (Non-Medical): No  Physical Activity: Inactive (10/19/2022)   Exercise Vital Sign    Days of Exercise per Week: 0 days    Minutes of Exercise per Session: 0 min  Stress: No Stress Concern Present (10/19/2022)   [employer] of Occupational Health - Occupational Stress Questionnaire    Feeling of Stress : Not at all  Social Connections: Moderately Isolated (08/07/2022)   Social Connection and Isolation Panel [NHANES]    Frequency of Communication with Friends and Family: Twice a week    Frequency of Social Gatherings with Friends and Family: Once a week    Attends Religious Services: 1 to 4 times per year    Active Member of [employer] or Organizations: No    Attends Banker Meetings: Never    Marital Status: Widowed  Intimate Partner Violence: Not At Risk (08/07/2022)   Humiliation, Afraid, Rape, and Kick questionnaire    Fear of Current or Ex-Partner: No    Emotionally Abused: No    Physically Abused: No    Sexually Abused: No                                                                                                  Objective:  Physical Exam: BP (!) 146/84 (BP Location: Left Arm, Patient Position: Sitting, Cuff Size: Large)   Pulse 80   Temp (!) 97.5 F (36.4 C) (Temporal)   SpO2 96%     Physical Exam Constitutional:      General: She is not in acute distress.    Appearance: Normal appearance. She is not ill-appearing or toxic-appearing.     Comments: Patient is wheelchair-bound  HENT:     Head: Normocephalic and atraumatic.     Nose: Nose normal. No congestion.  Eyes:     General: No scleral icterus.    Extraocular Movements: Extraocular movements intact.  Cardiovascular:     Rate and Rhythm: Normal rate and regular rhythm.     Pulses: Normal pulses.     Heart sounds: Normal heart sounds.  Pulmonary:     Effort: Pulmonary effort is  normal. No respiratory distress.     Breath sounds: Normal breath sounds.  Abdominal:     General: Abdomen is flat. Bowel sounds are normal.  Palpations: Abdomen is soft.  Musculoskeletal:        General: Normal range of motion.     Right knee: Swelling present. Tenderness present over the lateral joint line.  Lymphadenopathy:     Cervical: No cervical adenopathy.  Skin:    General: Skin is warm and dry.     Findings: No rash.  Neurological:     General: No focal deficit present.     Mental Status: She is alert and oriented to person, place, and time. Mental status is at baseline.  Psychiatric:        Mood and Affect: Mood normal.        Behavior: Behavior normal.        Thought Content: Thought content normal.        Judgment: Judgment normal.     PCV ECHOCARDIOGRAM COMPLETE  Result Date: 09/28/2022 Echocardiogram 09/25/2022: Normal LV systolic function with visual EF 60-65%. Left ventricle cavity is normal in size. Normal left ventricular wall thickness. Normal global wall motion. Doppler evidence of grade II (pseudonormal) diastolic dysfunction, elevated LAP. Calculated EF 66%. Left atrial cavity is mildly dilated at 37.6 ml/m^2. Trace mitral regurgitation. Mild calcification of the mitral valve annulus. Structurally normal tricuspid valve.  Mild tricuspid regurgitation. Mild pulmonary hypertension. RVSP measures 37 mmHg. No prior available for comparison.    Recent Results (from the past 2160 hour(s))  C-reactive protein     Status: None   Collection Time: 11/21/22 11:32 AM  Result Value Ref Range   CRP <1.0 0.5 - 20.0 mg/dL  CBC     Status: Abnormal   Collection Time: 11/21/22 11:32 AM  Result Value Ref Range   WBC 8.2 4.0 - 10.5 K/uL   RBC 3.93 3.87 - 5.11 Mil/uL   Platelets 281.0 150.0 - 400.0 K/uL   Hemoglobin 11.7 (L) 12.0 - 15.0 g/dL   HCT 16.1 09.6 - 04.5 %   MCV 92.0 78.0 - 100.0 fl   MCHC 32.4 30.0 - 36.0 g/dL   RDW 40.9 81.1 - 91.4 %  Comprehensive  metabolic panel     Status: Abnormal   Collection Time: 11/21/22 11:32 AM  Result Value Ref Range   Sodium 140 135 - 145 mEq/L   Potassium 4.9 3.5 - 5.1 mEq/L   Chloride 107 96 - 112 mEq/L   CO2 24 19 - 32 mEq/L   Glucose, Bld 90 70 - 99 mg/dL   BUN 39 (H) 6 - 23 mg/dL   Creatinine, Ser 7.82 (H) 0.40 - 1.20 mg/dL   Total Bilirubin 0.5 0.2 - 1.2 mg/dL   Alkaline Phosphatase 50 39 - 117 U/L   AST 24 0 - 37 U/L   ALT 15 0 - 35 U/L   Total Protein 7.3 6.0 - 8.3 g/dL   Albumin 4.2 3.5 - 5.2 g/dL   GFR 95.62 (L) >13.08 mL/min    Comment: Calculated using the CKD-EPI Creatinine Equation (2021)   Calcium 9.5 8.4 - 10.5 mg/dL  Iron, TIBC and Ferritin Panel     Status: None   Collection Time: 11/21/22 11:32 AM  Result Value Ref Range   Iron 102 45 - 160 mcg/dL   TIBC 657 846 - 962 mcg/dL (calc)   %SAT 28 16 - 45 % (calc)   Ferritin 27 16 - 288 ng/mL  TSH     Status: Abnormal   Collection Time: 11/21/22 11:32 AM  Result Value Ref Range   TSH 13.12 (H) 0.35 - 5.50 uIU/mL  Vit D  25 hydroxy (rtn osteoporosis monitoring)     Status: None   Collection Time: 11/21/22 11:32 AM  Result Value Ref Range   VITD 46.52 30.00 - 100.00 ng/mL  Vitamin B12     Status: None   Collection Time: 11/21/22 11:32 AM  Result Value Ref Range   Vitamin B-12 545 211 - 911 pg/mL  Sedimentation rate     Status: Abnormal   Collection Time: 11/21/22 11:32 AM  Result Value Ref Range   Sed Rate 48 (H) 0 - 30 mm/hr        Garner Nash, MD, MS

## 2022-11-21 NOTE — Patient Instructions (Signed)
Apply Voltaren gel to the right knee as directed (four times daily) to help with pain management. Get an X-ray of your right knee to evaluate the cause of the pain and any possible fractures. For xray, go to:    [place] [facility] [address], [place], [place] [address] Phone: [phone]  Monitor your blood pressure regularly at home since it was mildly elevated during your last visit. Schedule an appointment with a kidney doctor (nephrologist) for further evaluation of your kidney function. Provide a urine sample and blood work for further testing, including checking your thyroid, vitamin B12, vitamin D, iron levels, and overall kidney function.

## 2022-11-22 LAB — IRON,TIBC AND FERRITIN PANEL
%SAT: 28 % (calc) (ref 16–45)
Ferritin: 27 ng/mL (ref 16–288)
Iron: 102 ug/dL (ref 45–160)
TIBC: 360 mcg/dL (calc) (ref 250–450)

## 2022-11-23 DIAGNOSIS — Z780 Asymptomatic menopausal state: Secondary | ICD-10-CM | POA: Insufficient documentation

## 2022-11-23 DIAGNOSIS — N184 Chronic kidney disease, stage 4 (severe): Secondary | ICD-10-CM | POA: Insufficient documentation

## 2022-11-23 DIAGNOSIS — R519 Headache, unspecified: Secondary | ICD-10-CM | POA: Insufficient documentation

## 2022-11-23 DIAGNOSIS — G8929 Other chronic pain: Secondary | ICD-10-CM | POA: Insufficient documentation

## 2022-11-23 NOTE — Assessment & Plan Note (Signed)
Mildly elevated blood pressure (146/84 mmHg) likely secondary to pain.  Plan: Continue current medications  Monitor blood pressure at follow-up visits. Address underlying pain to potentially alleviate blood pressure.

## 2022-11-23 NOTE — Assessment & Plan Note (Signed)
Chronic right knee pain with tricompartmental degenerative changes indicative of arthritis.  Differential diagnosis: Primary osteoarthritis Possible fracture (pending X-ray results)  Plan: Order X-ray to rule out fractures. Initiate Voltaren gel 1% to apply 4 times a day as needed for pain. Consider referral to orthopedics if no improvement with current pain management plan. Evaluate physical therapy options, either in-home or at a physical therapy center. Monitor effectiveness and side effects of the current pain management regimen.

## 2022-11-23 NOTE — Assessment & Plan Note (Signed)
Suspected multifactorial etiology, possibly related to chronic pain, advanced age, and underlying kidney disease.  Plan: Referral to nephrologist for evaluation of CKD (stage 4). Comprehensive blood work including CBC, CMP, inflammatory markers (CRP, ferritin, ESR), vitamin levels (B12, D), and iron studies. Urinalysis to further evaluate renal function.

## 2022-11-26 ENCOUNTER — Other Ambulatory Visit: Payer: Self-pay

## 2022-11-26 DIAGNOSIS — I1A0 Resistant hypertension: Secondary | ICD-10-CM

## 2022-11-26 DIAGNOSIS — I10 Essential (primary) hypertension: Secondary | ICD-10-CM

## 2022-11-26 MED ORDER — LOSARTAN POTASSIUM 50 MG PO TABS
50.0000 mg | ORAL_TABLET | Freq: Every day | ORAL | 0 refills | Status: DC
Start: 2022-11-26 — End: 2023-06-18

## 2022-11-26 MED ORDER — AMLODIPINE BESYLATE 10 MG PO TABS
ORAL_TABLET | ORAL | 1 refills | Status: DC
Start: 2022-11-26 — End: 2023-06-18

## 2022-12-04 ENCOUNTER — Encounter: Payer: Self-pay | Admitting: Cardiology

## 2022-12-04 ENCOUNTER — Ambulatory Visit: Payer: 59 | Admitting: Cardiology

## 2022-12-04 VITALS — BP 188/65 | HR 69 | Resp 16 | Ht 62.0 in | Wt 137.6 lb

## 2022-12-04 DIAGNOSIS — I10 Essential (primary) hypertension: Secondary | ICD-10-CM

## 2022-12-04 MED ORDER — PINDOLOL 10 MG PO TABS
10.0000 mg | ORAL_TABLET | Freq: Two times a day (BID) | ORAL | 2 refills | Status: DC
Start: 2022-12-04 — End: 2023-08-15

## 2022-12-04 MED ORDER — ISOSORBIDE DINITRATE 30 MG PO TABS: 15.0000 mg | ORAL_TABLET | Freq: Two times a day (BID) | ORAL | 3 refills | Status: DC

## 2022-12-04 NOTE — Progress Notes (Signed)
Primary Physician/Referring:  Garnette Gunner, MD  Patient ID: Stacy Huang, female    DOB: [birth year], 89 y.o.   MRN: [id number]  Chief Complaint  Patient presents with   Hypertension   Follow-up    2 months    HPI:    Stacy Huang  is a 89 y.o.  African-American female patient with hypertension, hyperlipidemia, chronic stage IIIb kidney disease, hypothyroidism presents for management of hypertension.   Patient states that she is presently doing well, she has chronic headache and also mild dizziness but she has not had any fall.  She brings all her medications to the office.  She is accompanied by her son.  Past Medical History:  Diagnosis Date   Acute hypoxemic respiratory failure due to COVID-19 [facility]) 02/27/2020   Essential hypertension 04/21/2021   Hypertension    Hypertensive urgency 02/27/2020   Shoulder fracture, right    Thyroid disease    History reviewed. No pertinent surgical history. Family History  Problem Relation Age of Onset   Alzheimer's disease Mother    Hypertension Father    Other Sister        Died at birth   Cancer Brother        spinal   Heart attack Brother     Social History   Tobacco Use   Smoking status: Never    Passive exposure: Never   Smokeless tobacco: Never  Substance Use Topics   Alcohol use: Never   Marital Status: Widowed  ROS  Review of Systems  Cardiovascular:  Negative for chest pain, dyspnea on exertion and leg swelling.  Neurological:  Positive for dizziness and headaches.   Objective      12/04/2022   10:21 AM 12/04/2022   10:18 AM 11/21/2022   10:55 AM  Vitals with BMI  Height  5\' 2"    Weight  137 lbs 10 oz   BMI  25.16   Systolic 188 186 409  Diastolic 65 61 84  Pulse 69 73    Today's Vitals   12/04/22 1018 12/04/22 1021  BP: (!) 186/61 (!) 188/65  Pulse: 73 69  Resp: 16   SpO2: 94%   Weight: 137 lb 9.6 oz (62.4 kg)   Height: 5\' 2"  (1.575 m)    Body mass index is 25.17 kg/m.  Orthostatic  VS for the past 72 hrs (Last 3 readings):  Patient Position BP Location Cuff Size  12/04/22 1021 Sitting Left Arm Large  12/04/22 1018 Sitting Left Arm Large   Physical Exam Neck:     Vascular: No JVD.  Cardiovascular:     Rate and Rhythm: Normal rate and regular rhythm.     Pulses: Intact distal pulses.     Heart sounds: Normal heart sounds. No murmur heard.    No gallop.  Pulmonary:     Effort: Pulmonary effort is normal.     Breath sounds: Normal breath sounds.  Abdominal:     General: Bowel sounds are normal.     Palpations: Abdomen is soft.  Musculoskeletal:     Right lower leg: No edema.     Left lower leg: No edema.    Laboratory examination:   Recent Labs    12/25/21 0923 11/21/22 1132  NA 142 140  K 4.3 4.9  CL 105 107  CO2 21 24  GLUCOSE 105* 90  BUN 39* 39*  CREATININE 2.19* 2.17*  CALCIUM 9.4 9.5       Latest Ref Rng &  Units 11/21/2022   11:32 AM 12/25/2021    9:23 AM 11/27/2021   10:04 AM  CMP  Glucose 70 - 99 mg/dL 90  409  811   BUN 6 - 23 mg/dL 39  39  53   Creatinine 0.40 - 1.20 mg/dL 9.14  7.82  9.56   Sodium 135 - 145 mEq/L 140  142  140   Potassium 3.5 - 5.1 mEq/L 4.9  4.3  4.4   Chloride 96 - 112 mEq/L 107  105  103   CO2 19 - 32 mEq/L 24  21  22    Calcium 8.4 - 10.5 mg/dL 9.5  9.4  9.6   Total Protein 6.0 - 8.3 g/dL 7.3     Total Bilirubin 0.2 - 1.2 mg/dL 0.5     Alkaline Phos 39 - 117 U/L 50     AST 0 - 37 U/L 24     ALT 0 - 35 U/L 15         Latest Ref Rng & Units 11/21/2022   11:32 AM 04/25/2021    4:52 AM 04/24/2021    5:03 AM  CBC  WBC 4.0 - 10.5 K/uL 8.2  11.6  13.2   Hemoglobin 12.0 - 15.0 g/dL 21.3  8.9  9.3   Hematocrit 36.0 - 46.0 % 36.1  26.8  27.8   Platelets 150.0 - 400.0 K/uL 281.0  312  320    Radiology:   Cardiac Studies:   PCV ECHOCARDIOGRAM COMPLETE 09/25/2022  Narrative Echocardiogram 09/25/2022: Normal LV systolic function with visual EF 60-65%. Left ventricle cavity is normal in size. Normal left  ventricular wall thickness. Normal global wall motion. Doppler evidence of grade II (pseudonormal) diastolic dysfunction, elevated LAP. Calculated EF 66%. Left atrial cavity is mildly dilated at 37.6 ml/m^2. Trace mitral regurgitation. Mild calcification of the mitral valve annulus. Structurally normal tricuspid valve.  Mild tricuspid regurgitation. Mild pulmonary hypertension. RVSP measures 37 mmHg. No prior available for comparison.       EKG:   EKG 10/04/2022: Normal sinus rhythm at rate of 63 bpm, normal EKG.  Compared to 03/26/2022, no significant change.   Medications and allergies   Allergies  Allergen Reactions   Lactose Nausea Only and Other (See Comments)    GI Upset- Upsets the stomach   Lactose Intolerance (Gi) Other (See Comments)    Upsets the stomach    Current Outpatient Medications:    amLODipine (NORVASC) 10 MG tablet, TAKE 1 TABLET(10 MG) BY MOUTH EVERY MORNING, Disp: 90 tablet, Rfl: 1   diclofenac Sodium (VOLTAREN) 1 % GEL, Apply 4 g topically 4 (four) times daily as needed., Disp: 100 g, Rfl: 3   hydrALAZINE (APRESOLINE) 25 MG tablet, Take 1 tablet (25 mg total) by mouth 3 (three) times daily., Disp: 270 tablet, Rfl: 3   latanoprost (XALATAN) 0.005 % ophthalmic solution, Place 1 drop into the right eye at bedtime., Disp: 7.5 mL, Rfl: 0   losartan (COZAAR) 50 MG tablet, Take 1 tablet (50 mg total) by mouth daily., Disp: 90 tablet, Rfl: 0   isosorbide dinitrate (ISORDIL) 30 MG tablet, Take 0.5 tablets (15 mg total) by mouth 2 (two) times daily., Disp: 240 tablet, Rfl: 3   pindolol (VISKEN) 10 MG tablet, Take 1 tablet (10 mg total) by mouth 2 (two) times daily., Disp: 90 tablet, Rfl: 2   Assessment     ICD-10-CM   1. Primary hypertension  I10 pindolol (VISKEN) 10 MG tablet    isosorbide dinitrate (  ISORDIL) 30 MG tablet      Medications Discontinued During This Encounter  Medication Reason   isosorbide dinitrate (ISORDIL) 30 MG tablet Side effect (s)   pindolol  (VISKEN) 5 MG tablet Reorder      No orders of the defined types were placed in this encounter.  Meds ordered this encounter  Medications   pindolol (VISKEN) 10 MG tablet    Sig: Take 1 tablet (10 mg total) by mouth 2 (two) times daily.    Dispense:  90 tablet    Refill:  2   isosorbide dinitrate (ISORDIL) 30 MG tablet    Sig: Take 0.5 tablets (15 mg total) by mouth 2 (two) times daily.    Dispense:  240 tablet    Refill:  3    Recommendations:   Stacy Huang is a 89 y.o.  African-American female patient with hypertension, hyperlipidemia, chronic stage IIIb kidney disease, hypothyroidism presents for management of hypertension.   1. Primary hypertension Patient's blood pressure was well-controlled previously, she had responded well to isosorbide dinitrate however it was discontinued by patient and also PCP related to headache and also dizziness.  I advised her that she could try 1/2 tablet twice daily.  For now I will increase the dose of pindolol to 10 mg daily.  I would like to see her back in 3 months for follow-up.      Yates Decamp, MD, [facility] 12/04/2022, 11:16 AM Office: [phone]

## 2022-12-05 ENCOUNTER — Ambulatory Visit (INDEPENDENT_AMBULATORY_CARE_PROVIDER_SITE_OTHER): Payer: 59 | Admitting: Family Medicine

## 2022-12-05 ENCOUNTER — Ambulatory Visit: Payer: 59 | Admitting: Family Medicine

## 2022-12-05 ENCOUNTER — Encounter: Payer: Self-pay | Admitting: Family Medicine

## 2022-12-05 VITALS — BP 136/78 | HR 76 | Temp 97.6°F

## 2022-12-05 DIAGNOSIS — G8929 Other chronic pain: Secondary | ICD-10-CM

## 2022-12-05 DIAGNOSIS — M25561 Pain in right knee: Secondary | ICD-10-CM | POA: Diagnosis not present

## 2022-12-05 DIAGNOSIS — I1A0 Resistant hypertension: Secondary | ICD-10-CM

## 2022-12-05 DIAGNOSIS — R5383 Other fatigue: Secondary | ICD-10-CM

## 2022-12-05 DIAGNOSIS — H409 Unspecified glaucoma: Secondary | ICD-10-CM

## 2022-12-05 LAB — MICROALBUMIN / CREATININE URINE RATIO
Creatinine,U: 53.3 mg/dL
Microalb Creat Ratio: 24.9 mg/g (ref 0.0–30.0)
Microalb, Ur: 13.2 mg/dL — ABNORMAL HIGH (ref 0.0–1.9)

## 2022-12-05 MED ORDER — ACETAMINOPHEN 500 MG PO TABS
1000.0000 mg | ORAL_TABLET | Freq: Four times a day (QID) | ORAL | 0 refills | Status: AC | PRN
Start: 2022-12-05 — End: 2023-01-04

## 2022-12-05 NOTE — Assessment & Plan Note (Signed)
Patient reports worsening vision. Last seen by Ophthalmology approximately a year ago. -Refer to Ophthalmology for evaluation and management.

## 2022-12-05 NOTE — Assessment & Plan Note (Signed)
Improved

## 2022-12-05 NOTE — Assessment & Plan Note (Signed)
Improved control after recent medication adjustments by cardiologist (Pindolol 10mg  BID, Isosorbide dinitrate 15mg  BID, Amlodipine 10mg  daily, Hydralazine 25mg  TID, Losartan 50mg  daily).  -Continue current regimen. -Check blood pressure at home regularly.

## 2022-12-05 NOTE — Patient Instructions (Signed)
Continue with Tylenol 1000 mg four times a day as needed. Use Voltaren gel 1% on the right knee four times a day as needed for pain. Monitor blood pressure at home and report elevated readings. Follow-up with Dr. Michelle Nasuti for glaucoma management. Attend the scheduled referral appointment with orthopedics.

## 2022-12-05 NOTE — Assessment & Plan Note (Signed)
Persistent pain despite use of Diclofenac gel and Tylenol. -Increase Tylenol to 1000mg  QID as needed for pain. -Refer to Orthopedics for further evaluation and management.

## 2022-12-05 NOTE — Progress Notes (Signed)
Assessment/Plan:   Problem List Items Addressed This Visit       Cardiovascular and Mediastinum   Resistant hypertension    Improved control after recent medication adjustments by cardiologist (Pindolol 10mg  BID, Isosorbide dinitrate 15mg  BID, Amlodipine 10mg  daily, Hydralazine 25mg  TID, Losartan 50mg  daily).  -Continue current regimen. -Check blood pressure at home regularly.        Other   Other fatigue    Improved      Glaucoma of right eye - Primary    Patient reports worsening vision. Last seen by Ophthalmology approximately a year ago. -Refer to Ophthalmology for evaluation and management.      Relevant Orders   Ambulatory referral to Ophthalmology   Chronic pain of right knee    Persistent pain despite use of Diclofenac gel and Tylenol. -Increase Tylenol to 1000mg  QID as needed for pain. -Refer to Orthopedics for further evaluation and management.      Relevant Medications   acetaminophen (TYLENOL) 500 MG tablet   Other Relevant Orders   Ambulatory referral to Orthopedics    There are no discontinued medications.  Return if symptoms worsen or fail to improve.    Subjective:   Encounter date: 12/05/2022  Stacy Huang is a 89 y.o. female who has Other fatigue; Stage 3b chronic kidney disease (HCC); History of deep vein thrombosis (DVT) of lower extremity; Hypothyroidism; Hyperlipidemia; Compression fracture of L1 vertebra (HCC); Dizziness and giddiness; Hearing loss of both ears due to cerumen impaction; Resistant hypertension; Dizziness; Vertigo; Frail elderly; Difficulty transferring from wheelchair to chair; Glaucoma of right eye; Nocturnal leg cramps; Stage 4 chronic kidney disease (HCC); Chronic nonintractable headache; Postmenopausal estrogen deficiency; and Chronic pain of right knee on their problem list..   She  has a past medical history of Acute hypoxemic respiratory failure due to COVID-19 [facility]) (02/27/2020), Essential hypertension (04/21/2021),  Hypertension, Hypertensive urgency (02/27/2020), Shoulder fracture, right, and Thyroid disease..   She presents with chief complaint of Medical Management of Chronic Issues (1 week follow up b/p and knee pain. Cramping in legs at night. ) .   Discussed the use of AI scribe software for clinical note transcription with the patient, who gave verbal consent to proceed.  History of Present Illness   The patient, with a history of hypertension, glaucoma, and osteoarthritis of the knee, presents for a follow-up visit. She reports that her blood pressure has improved significantly following recent medication adjustments by her cardiologist, [name]. The patient is currently on pendolol 10 mg twice daily and isosorbide dinitrate 15 mg twice daily, along with amlodipine 10 mg, hydralazine 25 mg three times daily, and losartan 50 mg. She tolerates these medications well and reports feeling much better since the adjustments.  However, the patient continues to experience discomfort in her knee, describing the sensation as if the joint is "coming apart." She has been using diclofenac gel four times daily and taking Tylenol for pain management, but reports minimal relief. She has previously received steroid injections and undergone physical therapy for her knee pain.  In addition, the patient reports concerns about her vision, which she feels is worsening. She has been using Xalatan eye drops for glaucoma management but is unsure if the medication is strong enough. The patient denies any pain associated with her glaucoma but mentions occasional blurriness in both eyes, more so in the right eye.       Review of Systems  Constitutional:  Negative for chills, diaphoresis, fever, malaise/fatigue (fatigue resolved) and  weight loss.  HENT:  Negative for congestion, ear discharge, ear pain and hearing loss.   Eyes:  Positive for blurred vision. Negative for double vision, photophobia, pain, discharge and redness.   Respiratory:  Negative for cough, sputum production, shortness of breath and wheezing.   Cardiovascular:  Negative for chest pain and palpitations.  Gastrointestinal:  Negative for abdominal pain, blood in stool, constipation, diarrhea, heartburn, melena, nausea and vomiting.  Genitourinary:  Negative for dysuria, flank pain, frequency, hematuria and urgency.  Musculoskeletal:  Positive for joint pain (Right knee). Negative for myalgias.  Skin:  Negative for itching and rash.  Neurological:  Positive for dizziness (Improved status post cerumen removal from ENT) and weakness (Chronic and generalized, not worsening). Negative for tingling, tremors, speech change, seizures, loss of consciousness and headaches.  Psychiatric/Behavioral:  Negative for depression, hallucinations, memory loss, substance abuse and suicidal ideas. The patient does not have insomnia.   All other systems reviewed and are negative.   No past surgical history on file.  Outpatient Medications Prior to Visit  Medication Sig Dispense Refill   amLODipine (NORVASC) 10 MG tablet TAKE 1 TABLET(10 MG) BY MOUTH EVERY MORNING 90 tablet 1   diclofenac Sodium (VOLTAREN) 1 % GEL Apply 4 g topically 4 (four) times daily as needed. 100 g 3   hydrALAZINE (APRESOLINE) 25 MG tablet Take 1 tablet (25 mg total) by mouth 3 (three) times daily. 270 tablet 3   isosorbide dinitrate (ISORDIL) 30 MG tablet Take 0.5 tablets (15 mg total) by mouth 2 (two) times daily. 240 tablet 3   latanoprost (XALATAN) 0.005 % ophthalmic solution Place 1 drop into the right eye at bedtime. 7.5 mL 0   losartan (COZAAR) 50 MG tablet Take 1 tablet (50 mg total) by mouth daily. 90 tablet 0   pindolol (VISKEN) 10 MG tablet Take 1 tablet (10 mg total) by mouth 2 (two) times daily. 90 tablet 2   No facility-administered medications prior to visit.    Family History  Problem Relation Age of Onset   Alzheimer's disease Mother    Hypertension Father    Other Sister         Died at birth   Cancer Brother        spinal   Heart attack Brother     Social History   Socioeconomic History   Marital status: Widowed    Spouse name: Not on file   Number of children: 1   Years of education: Not on file   Highest education level: Not on file  Occupational History   Not on file  Tobacco Use   Smoking status: Never    Passive exposure: Never   Smokeless tobacco: Never  Vaping Use   Vaping Use: Never used  Substance and Sexual Activity   Alcohol use: Never   Drug use: Never   Sexual activity: Not on file  Other Topics Concern   Not on file  Social History Narrative   Not on file   Social Determinants of Health   Financial Resource Strain: Low Risk  (10/19/2022)   Overall Financial Resource Strain (CARDIA)    Difficulty of Paying Living Expenses: Not hard at all  Food Insecurity: No Food Insecurity (10/19/2022)   Hunger Vital Sign    Worried About Running Out of Food in the Last Year: Never true    Ran Out of Food in the Last Year: Never true  Transportation Needs: No Transportation Needs (10/19/2022)   PRAPARE - Transportation  Lack of Transportation (Medical): No    Lack of Transportation (Non-Medical): No  Physical Activity: Inactive (10/19/2022)   Exercise Vital Sign    Days of Exercise per Week: 0 days    Minutes of Exercise per Session: 0 min  Stress: No Stress Concern Present (10/19/2022)   [employer] of Occupational Health - Occupational Stress Questionnaire    Feeling of Stress : Not at all  Social Connections: Moderately Isolated (08/07/2022)   Social Connection and Isolation Panel [NHANES]    Frequency of Communication with Friends and Family: Twice a week    Frequency of Social Gatherings with Friends and Family: Once a week    Attends Religious Services: 1 to 4 times per year    Active Member of [employer] or Organizations: No    Attends Banker Meetings: Never    Marital Status: Widowed  Intimate Partner  Violence: Not At Risk (08/07/2022)   Humiliation, Afraid, Rape, and Kick questionnaire    Fear of Current or Ex-Partner: No    Emotionally Abused: No    Physically Abused: No    Sexually Abused: No                                                                                                  Objective:  Physical Exam: BP 136/78 (BP Location: Right Arm, Patient Position: Sitting, Cuff Size: Large)   Pulse 76   Temp 97.6 F (36.4 C) (Temporal)   SpO2 99%     Physical Exam Constitutional:      General: She is not in acute distress.    Appearance: Normal appearance. She is not ill-appearing or toxic-appearing.     Comments: Patient is wheelchair-bound  HENT:     Head: Normocephalic and atraumatic.     Nose: Nose normal. No congestion.  Eyes:     General: No scleral icterus.    Extraocular Movements: Extraocular movements intact.  Cardiovascular:     Rate and Rhythm: Normal rate and regular rhythm.     Pulses: Normal pulses.     Heart sounds: Normal heart sounds.  Pulmonary:     Effort: Pulmonary effort is normal. No respiratory distress.     Breath sounds: Normal breath sounds.  Abdominal:     General: Abdomen is flat. Bowel sounds are normal.     Palpations: Abdomen is soft.  Musculoskeletal:        General: Normal range of motion.     Right knee: Swelling present. Tenderness present over the lateral joint line.  Lymphadenopathy:     Cervical: No cervical adenopathy.  Skin:    General: Skin is warm and dry.     Findings: No rash.  Neurological:     General: No focal deficit present.     Mental Status: She is alert and oriented to person, place, and time. Mental status is at baseline.  Psychiatric:        Mood and Affect: Mood normal.        Behavior: Behavior normal.        Thought Content: Thought content normal.  Judgment: Judgment normal.     PCV ECHOCARDIOGRAM COMPLETE  Result Date: 09/28/2022 Echocardiogram 09/25/2022: Normal LV systolic function  with visual EF 60-65%. Left ventricle cavity is normal in size. Normal left ventricular wall thickness. Normal global wall motion. Doppler evidence of grade II (pseudonormal) diastolic dysfunction, elevated LAP. Calculated EF 66%. Left atrial cavity is mildly dilated at 37.6 ml/m^2. Trace mitral regurgitation. Mild calcification of the mitral valve annulus. Structurally normal tricuspid valve.  Mild tricuspid regurgitation. Mild pulmonary hypertension. RVSP measures 37 mmHg. No prior available for comparison.    Recent Results (from the past 2160 hour(s))  C-reactive protein     Status: None   Collection Time: 11/21/22 11:32 AM  Result Value Ref Range   CRP <1.0 0.5 - 20.0 mg/dL  CBC     Status: Abnormal   Collection Time: 11/21/22 11:32 AM  Result Value Ref Range   WBC 8.2 4.0 - 10.5 K/uL   RBC 3.93 3.87 - 5.11 Mil/uL   Platelets 281.0 150.0 - 400.0 K/uL   Hemoglobin 11.7 (L) 12.0 - 15.0 g/dL   HCT 11.9 14.7 - 82.9 %   MCV 92.0 78.0 - 100.0 fl   MCHC 32.4 30.0 - 36.0 g/dL   RDW 56.2 13.0 - 86.5 %  Comprehensive metabolic panel     Status: Abnormal   Collection Time: 11/21/22 11:32 AM  Result Value Ref Range   Sodium 140 135 - 145 mEq/L   Potassium 4.9 3.5 - 5.1 mEq/L   Chloride 107 96 - 112 mEq/L   CO2 24 19 - 32 mEq/L   Glucose, Bld 90 70 - 99 mg/dL   BUN 39 (H) 6 - 23 mg/dL   Creatinine, Ser 7.84 (H) 0.40 - 1.20 mg/dL   Total Bilirubin 0.5 0.2 - 1.2 mg/dL   Alkaline Phosphatase 50 39 - 117 U/L   AST 24 0 - 37 U/L   ALT 15 0 - 35 U/L   Total Protein 7.3 6.0 - 8.3 g/dL   Albumin 4.2 3.5 - 5.2 g/dL   GFR 69.62 (L) >95.28 mL/min    Comment: Calculated using the CKD-EPI Creatinine Equation (2021)   Calcium 9.5 8.4 - 10.5 mg/dL  Iron, TIBC and Ferritin Panel     Status: None   Collection Time: 11/21/22 11:32 AM  Result Value Ref Range   Iron 102 45 - 160 mcg/dL   TIBC 413 244 - 010 mcg/dL (calc)   %SAT 28 16 - 45 % (calc)   Ferritin 27 16 - 288 ng/mL  TSH     Status: Abnormal    Collection Time: 11/21/22 11:32 AM  Result Value Ref Range   TSH 13.12 (H) 0.35 - 5.50 uIU/mL  Vit D  25 hydroxy (rtn osteoporosis monitoring)     Status: None   Collection Time: 11/21/22 11:32 AM  Result Value Ref Range   VITD 46.52 30.00 - 100.00 ng/mL  Vitamin B12     Status: None   Collection Time: 11/21/22 11:32 AM  Result Value Ref Range   Vitamin B-12 545 211 - 911 pg/mL  Sedimentation rate     Status: Abnormal   Collection Time: 11/21/22 11:32 AM  Result Value Ref Range   Sed Rate 48 (H) 0 - 30 mm/hr        Garner Nash, MD, MS

## 2022-12-06 LAB — URINALYSIS W MICROSCOPIC + REFLEX CULTURE
Bilirubin Urine: NEGATIVE
Glucose, UA: NEGATIVE
Hgb urine dipstick: NEGATIVE
Hyaline Cast: NONE SEEN /LPF
Ketones, ur: NEGATIVE
Leukocyte Esterase: NEGATIVE
Nitrites, Initial: NEGATIVE
RBC / HPF: NONE SEEN /HPF (ref 0–2)
Specific Gravity, Urine: 1.012 (ref 1.001–1.035)
pH: 7.5 (ref 5.0–8.0)

## 2022-12-06 LAB — NO CULTURE INDICATED

## 2022-12-10 ENCOUNTER — Telehealth: Payer: Self-pay | Admitting: Family Medicine

## 2022-12-10 NOTE — Telephone Encounter (Signed)
[facility] called to say the pt already has an eye doctor and wants to go see them.

## 2022-12-16 DIAGNOSIS — Z0130 Encounter for examination of blood pressure without abnormal findings: Secondary | ICD-10-CM | POA: Diagnosis not present

## 2022-12-16 DIAGNOSIS — I10 Essential (primary) hypertension: Secondary | ICD-10-CM | POA: Diagnosis not present

## 2022-12-17 NOTE — Progress Notes (Unsigned)
Office Visit Note   Patient: Stacy Huang           Date of Birth: 1921-05-09           MRN: [id number] Visit Date: 12/18/2022              Requested by: Garnette Gunner, MD [address] [place],  [place] [address] PCP: Garnette Gunner, MD   Assessment & Plan: Visit Diagnoses:  1. Chronic pain of right knee     Plan: Celsie is a 89 year old female with advanced right knee DJD.  Cortisone injections have not been effective in the past.  I suggested a knee brace.  We provided her with a DJ O reaction brace.  Recommended topical and oral NSAIDs.  Follow-up as needed.  Follow-Up Instructions: No follow-ups on file.   Orders:  Orders Placed This Encounter  Procedures   XR KNEE 3 VIEW RIGHT   No orders of the defined types were placed in this encounter.     Procedures: No procedures performed   Clinical Data: No additional findings.   Subjective: Chief Complaint  Patient presents with   Right Knee - Pain    HPI Patient comes in today for chronic right knee pain with sensation of giving way and falling.  She has global pain.  Takes Tylenol on a daily basis.  Son is present with her today. Review of Systems  Constitutional: Negative.   HENT: Negative.    Eyes: Negative.   Respiratory: Negative.    Cardiovascular: Negative.   Endocrine: Negative.   Musculoskeletal: Negative.   Neurological: Negative.   Hematological: Negative.   Psychiatric/Behavioral: Negative.    All other systems reviewed and are negative.    Objective: Vital Signs: There were no vitals taken for this visit.  Physical Exam Vitals and nursing note reviewed.  Constitutional:      Appearance: She is well-developed.  HENT:     Head: Atraumatic.     Nose: Nose normal.  Eyes:     Extraocular Movements: Extraocular movements intact.  Cardiovascular:     Pulses: Normal pulses.  Pulmonary:     Effort: Pulmonary effort is normal.  Abdominal:     Palpations: Abdomen is soft.   Musculoskeletal:     Cervical back: Neck supple.  Skin:    General: Skin is warm.     Capillary Refill: Capillary refill takes less than 2 seconds.  Neurological:     Mental Status: She is alert. Mental status is at baseline.  Psychiatric:        Behavior: Behavior normal.        Thought Content: Thought content normal.        Judgment: Judgment normal.    Ortho Exam Examination right knee shows mild soft tissue swelling.  No joint effusion.  Pain and patellofemoral crepitus with range of motion.  Collaterals and cruciates are stable.  Medial and lateral joint line tenderness. Specialty Comments:  No specialty comments available.  Imaging: XR KNEE 3 VIEW RIGHT  Result Date: 12/18/2022 X-rays demonstrate severe osteoarthritis.  Bone-on-bone joint space narrowing.    PMFS History: Patient Active Problem List   Diagnosis Date Noted   Stage 4 chronic kidney disease (HCC) 11/23/2022   Chronic nonintractable headache 11/23/2022   Postmenopausal estrogen deficiency 11/23/2022   Chronic pain of right knee 11/23/2022   Nocturnal leg cramps 08/21/2022   Hearing loss of both ears due to cerumen impaction 07/26/2022   Resistant hypertension 07/26/2022  Dizziness 07/26/2022   Vertigo 07/26/2022   Frail elderly 07/26/2022   Difficulty transferring from wheelchair to chair 07/26/2022   Glaucoma of right eye 07/26/2022   Dizziness and giddiness 10/09/2021   History of deep vein thrombosis (DVT) of lower extremity 04/21/2021   Hypothyroidism 04/21/2021   Hyperlipidemia 04/21/2021   Compression fracture of L1 vertebra (HCC) 04/21/2021   Stage 3b chronic kidney disease (HCC) 03/02/2020   Other fatigue 02/27/2020   Past Medical History:  Diagnosis Date   Acute hypoxemic respiratory failure due to COVID-19 (HCC) 02/27/2020   Essential hypertension 04/21/2021   Hypertension    Hypertensive urgency 02/27/2020   Shoulder fracture, right    Thyroid disease     Family History   Problem Relation Age of Onset   Alzheimer's disease Mother    Hypertension Father    Other Sister        Died at birth   Cancer Brother        spinal   Heart attack Brother     No past surgical history on file. Social History   Occupational History   Not on file  Tobacco Use   Smoking status: Never    Passive exposure: Never   Smokeless tobacco: Never  Vaping Use   Vaping Use: Never used  Substance and Sexual Activity   Alcohol use: Never   Drug use: Never   Sexual activity: Not on file

## 2022-12-18 ENCOUNTER — Other Ambulatory Visit (INDEPENDENT_AMBULATORY_CARE_PROVIDER_SITE_OTHER): Payer: 59

## 2022-12-18 ENCOUNTER — Ambulatory Visit (INDEPENDENT_AMBULATORY_CARE_PROVIDER_SITE_OTHER): Payer: 59 | Admitting: Orthopaedic Surgery

## 2022-12-18 DIAGNOSIS — M25561 Pain in right knee: Secondary | ICD-10-CM | POA: Diagnosis not present

## 2022-12-18 DIAGNOSIS — G8929 Other chronic pain: Secondary | ICD-10-CM

## 2022-12-26 DIAGNOSIS — H3561 Retinal hemorrhage, right eye: Secondary | ICD-10-CM | POA: Diagnosis not present

## 2022-12-26 DIAGNOSIS — H5212 Myopia, left eye: Secondary | ICD-10-CM | POA: Diagnosis not present

## 2022-12-26 DIAGNOSIS — H16143 Punctate keratitis, bilateral: Secondary | ICD-10-CM | POA: Diagnosis not present

## 2022-12-26 DIAGNOSIS — H52222 Regular astigmatism, left eye: Secondary | ICD-10-CM | POA: Diagnosis not present

## 2022-12-26 DIAGNOSIS — H43813 Vitreous degeneration, bilateral: Secondary | ICD-10-CM | POA: Diagnosis not present

## 2022-12-26 DIAGNOSIS — H04123 Dry eye syndrome of bilateral lacrimal glands: Secondary | ICD-10-CM | POA: Diagnosis not present

## 2022-12-26 DIAGNOSIS — H524 Presbyopia: Secondary | ICD-10-CM | POA: Diagnosis not present

## 2022-12-26 DIAGNOSIS — H4010X1 Unspecified open-angle glaucoma, mild stage: Secondary | ICD-10-CM | POA: Diagnosis not present

## 2022-12-26 DIAGNOSIS — Z961 Presence of intraocular lens: Secondary | ICD-10-CM | POA: Diagnosis not present

## 2023-01-04 ENCOUNTER — Ambulatory Visit (INDEPENDENT_AMBULATORY_CARE_PROVIDER_SITE_OTHER): Payer: 59 | Admitting: *Deleted

## 2023-01-04 DIAGNOSIS — I10 Essential (primary) hypertension: Secondary | ICD-10-CM

## 2023-01-04 DIAGNOSIS — N1832 Chronic kidney disease, stage 3b: Secondary | ICD-10-CM

## 2023-01-04 NOTE — Chronic Care Management (AMB) (Signed)
Chronic Care Management   CCM RN Visit Note  01/04/2023 Name: Stacy Huang MRN: [id number] DOB: Mar 02, 1921  Subjective: Stacy Huang is a 89 y.o. year old female who is a primary care patient of Garnette Gunner, MD. The patient was referred to the Chronic Care Management team for assistance with care management needs subsequent to provider initiation of CCM services and plan of care.    Today's Visit:  Engaged with patient by telephone for follow up visit.        Goals Addressed             This Visit's Progress    CCM (CHRONIC KIDNEY DISEASE) EXPECTED OUTCOME: MONITOR, SELF-MANAGE AND REDUCE SYMPTOMS OF CHRONIC KIDNEY DISEASE       Current Barriers:  Knowledge Deficits related to Chronic Kidney Disease management Chronic Disease Management support and education needs related to Chronic Kidney Disease Patient's son answered the phone, RN care manager spoke with both patient Patient reports she recently saw ENT [name] and "they cleaned out my ears", this has helped somewhat with the dizziness No new concerns reported today  Planned Interventions: Evaluation of current treatment plan related to chronic kidney disease self management and patient's adherence to plan as established by provider      Reviewed medications with patient and discussed importance of compliance    Advised patient, providing education and rationale, to monitor blood pressure daily and record, calling PCP for findings outside established parameters    Discussed complications of poorly controlled blood pressure such as heart disease, stroke, circulatory complications, vision complications, kidney impairment, sexual dysfunction    Advised patient to discuss vertigo, change in health status with provider    Reviewed importance of asking for assistance as needed and do not ambulate if having vertigo Reviewed safety precautions Reviewed all upcoming scheduled appointments Reviewed plan of care  with patient including case closure  Symptom Management: Take medications as prescribed   Attend all scheduled provider appointments Call pharmacy for medication refills 3-7 days in advance of running out of medications Call provider office for new concerns or questions  Follow low sodium and heart healthy diet Read food labels, limit fast food fall prevention strategies: change position slowly, use assistive device such as walker or cane (per provider recommendations) when walking, keep walkways clear, have good lighting in room. It is important to contact your provider if you have any falls, maintain muscle strength/tone by exercise per provider recommendations. Case closure  Follow Up Plan: No further follow up required: Case closure          CCM (HYPERTENSION) EXPECTED OUTCOME: MONITOR, SELF-MANAGE AND REDUCE SYMPTOMS OF HYPERTENSION       Current Barriers:  Knowledge Deficits related to Hypertension management Chronic Disease Management support and education needs related to Hypertension, diet No Advanced Directives in place-patient declines information Patient reports she lives with her son, pt is able to cook some and her son also helps cook, son provides oversight with medications, transportation and with any other assistance pt may need Patient reports she has walker that she uses at home, and wheelchair she uses when she leaves home because she cannot walk long distances Patient reports she is seeing cardiologist [name] for help with managing blood pressure, pt reports she is checking blood pressure several times per week, states " readings have been good" Patient reports she has all medications including eye drops Spoke with patient about referral for nephrologist that was done at last  primary care provider visit, pt states " I'm not interested and not going to do that"  Planned Interventions: Evaluation of current treatment plan related to hypertension self management and  patient's adherence to plan as established by provider;   Provided education to patient re: stroke prevention, s/s of heart attack and stroke; Reviewed medications with patient and discussed importance of compliance;  Advised patient, providing education and rationale, to monitor blood pressure daily and record, calling PCP for findings outside established parameters;  Advised patient to discuss any issues with blood pressure, medications with provider; Reinforced low sodium diet Reviewed plan of care and case closure, pt is not interested in seeing nephrologist and working towards any further goals for care management  Symptom Management: Take medications as prescribed   Attend all scheduled provider appointments Call pharmacy for medication refills 3-7 days in advance of running out of medications Call provider office for new concerns or questions  check blood pressure daily choose a place to take my blood pressure (home, clinic or office, retail store) write blood pressure results in a log or diary learn about high blood pressure keep a blood pressure log take blood pressure log to all doctor appointments call doctor for signs and symptoms of high blood pressure keep all doctor appointments take medications for blood pressure exactly as prescribed report new symptoms to your doctor eat more whole grains, fruits and vegetables, lean meats and healthy fats Follow low sodium diet Read food labels for sodium content Case closure  Follow Up Plan: No further follow up required: case closure             Plan:No further follow up required: case closure  Irving Shows [facility], BSN RN Case Manager [facility] [place] [phone]

## 2023-01-04 NOTE — Patient Instructions (Signed)
Please call the care guide team at [phone] if you need to cancel or reschedule your appointment.   If you are experiencing a Mental Health or Behavioral Health Crisis or need someone to talk to, please call the Suicide and Crisis Lifeline: 988 call the [place] National Suicide Prevention Lifeline: 763-129-0081 or TTY: 6810068748 TTY [phone]) to talk to a trained counselor call 1-800-273-TALK (toll free, 24 hour hotline) go to [facility] [address], [place] [phone]) call 911   Following is a copy of the CCM Program Consent:  CCM service includes personalized support from designated clinical staff supervised by the physician, including individualized plan of care and coordination with other care providers 24/7 contact phone numbers for assistance for urgent and routine care needs. Service will only be billed when office clinical staff spend 20 minutes or more in a month to coordinate care. Only one practitioner may furnish and bill the service in a calendar month. The patient may stop CCM services at amy time (effective at the end of the month) by phone call to the office staff. The patient will be responsible for cost sharing (co-pay) or up to 20% of the service fee (after annual deductible is met)  Following is a copy of your full provider care plan:   Goals Addressed             This Visit's Progress    CCM (CHRONIC KIDNEY DISEASE) EXPECTED OUTCOME: MONITOR, SELF-MANAGE AND REDUCE SYMPTOMS OF CHRONIC KIDNEY DISEASE       Current Barriers:  Knowledge Deficits related to Chronic Kidney Disease management Chronic Disease Management support and education needs related to Chronic Kidney Disease Patient's son answered the phone, RN care manager spoke with both patient Patient reports she recently saw ENT [name] and "they cleaned out my ears", this has helped somewhat with the dizziness No new concerns reported  today  Planned Interventions: Evaluation of current treatment plan related to chronic kidney disease self management and patient's adherence to plan as established by provider      Reviewed medications with patient and discussed importance of compliance    Advised patient, providing education and rationale, to monitor blood pressure daily and record, calling PCP for findings outside established parameters    Discussed complications of poorly controlled blood pressure such as heart disease, stroke, circulatory complications, vision complications, kidney impairment, sexual dysfunction    Advised patient to discuss vertigo, change in health status with provider    Reviewed importance of asking for assistance as needed and do not ambulate if having vertigo Reviewed safety precautions Reviewed all upcoming scheduled appointments Reviewed plan of care with patient including case closure  Symptom Management: Take medications as prescribed   Attend all scheduled provider appointments Call pharmacy for medication refills 3-7 days in advance of running out of medications Call provider office for new concerns or questions  Follow low sodium and heart healthy diet Read food labels, limit fast food fall prevention strategies: change position slowly, use assistive device such as walker or cane (per provider recommendations) when walking, keep walkways clear, have good lighting in room. It is important to contact your provider if you have any falls, maintain muscle strength/tone by exercise per provider recommendations. Case closure  Follow Up Plan: No further follow up required: Case closure          CCM (HYPERTENSION) EXPECTED OUTCOME: MONITOR, SELF-MANAGE AND REDUCE SYMPTOMS OF HYPERTENSION       Current Barriers:  Knowledge Deficits related to Hypertension management Chronic Disease Management support and education needs related to Hypertension, diet No Advanced Directives in place-patient  declines information Patient reports she lives with her son, pt is able to cook some and her son also helps cook, son provides oversight with medications, transportation and with any other assistance pt may need Patient reports she has walker that she uses at home, and wheelchair she uses when she leaves home because she cannot walk long distances Patient reports she is seeing cardiologist [name] for help with managing blood pressure, pt reports she is checking blood pressure several times per week, states " readings have been good" Patient reports she has all medications including eye drops Spoke with patient about referral for nephrologist that was done at last primary care provider visit, pt states " I'm not interested and not going to do that"  Planned Interventions: Evaluation of current treatment plan related to hypertension self management and patient's adherence to plan as established by provider;   Provided education to patient re: stroke prevention, s/s of heart attack and stroke; Reviewed medications with patient and discussed importance of compliance;  Advised patient, providing education and rationale, to monitor blood pressure daily and record, calling PCP for findings outside established parameters;  Advised patient to discuss any issues with blood pressure, medications with provider; Reinforced low sodium diet Reviewed plan of care and case closure, pt is not interested in seeing nephrologist and working towards any further goals for care management  Symptom Management: Take medications as prescribed   Attend all scheduled provider appointments Call pharmacy for medication refills 3-7 days in advance of running out of medications Call provider office for new concerns or questions  check blood pressure daily choose a place to take my blood pressure (home, clinic or office, retail store) write blood pressure results in a log or diary learn about high blood pressure keep a  blood pressure log take blood pressure log to all doctor appointments call doctor for signs and symptoms of high blood pressure keep all doctor appointments take medications for blood pressure exactly as prescribed report new symptoms to your doctor eat more whole grains, fruits and vegetables, lean meats and healthy fats Follow low sodium diet Read food labels for sodium content Case closure  Follow Up Plan: No further follow up required: case closure             The patient verbalized understanding of instructions, educational materials, and care plan provided today and DECLINED offer to receive copy of patient instructions, educational materials, and care plan.  No further follow up required: case closure

## 2023-01-09 DIAGNOSIS — N189 Chronic kidney disease, unspecified: Secondary | ICD-10-CM

## 2023-01-09 DIAGNOSIS — I129 Hypertensive chronic kidney disease with stage 1 through stage 4 chronic kidney disease, or unspecified chronic kidney disease: Secondary | ICD-10-CM | POA: Diagnosis not present

## 2023-03-06 ENCOUNTER — Ambulatory Visit: Payer: 59 | Admitting: Cardiology

## 2023-05-23 ENCOUNTER — Ambulatory Visit: Payer: 59 | Attending: Cardiology | Admitting: Physician Assistant

## 2023-05-23 ENCOUNTER — Encounter: Payer: Self-pay | Admitting: Physician Assistant

## 2023-05-23 VITALS — BP 168/76 | HR 76 | Wt 140.0 lb

## 2023-05-23 DIAGNOSIS — I10 Essential (primary) hypertension: Secondary | ICD-10-CM

## 2023-05-23 NOTE — Progress Notes (Signed)
Cardiology Office Note:  .   Date:  05/23/2023  ID:  Stacy Huang, DOB 12-24-1920, MRN [id number] PCP: Stacy Gunner, MD  [facility] Providers Cardiologist:  None {  History of Present Illness: .   Stacy Huang is a 89 y.o. female with a past medical history of hypertension, hyperlipidemia, chronic stage IIIb kidney disease, hypothyroidism who presents for cardiology follow-up.  When she was seen by [name] 11/2022 she was doing well.  Has chronic headaches and mild dizziness but no falls.  Brings her medications to the office.  Accompanied by her son.  No new cardiovascular symptoms.  Today, she is officially a centenarian, presents for a routine follow-up. She has a history of hypertension, for which she takes amlodipine 10mg  daily, hydralazine 25mg  TID, isosorbide 15mg  BID, and losartan 50mg  daily. She reports that her blood pressure fluctuates, with recent readings as high as 160. She has a home blood pressure monitor and has been tracking her readings.  The patient also reports chronic headaches and dizziness, but no falls or syncope. She has arthritis in her back and knee, which limits her mobility and causes her to move slowly due to fear of falling. She uses a walker for stability.  The patient denies any new cardiac symptoms, including chest pain or shortness of breath. She has not had any recent lab work done and does not have a scheduled appointment with her primary care provider, Dr. Janee Huang.  Reports no shortness of breath nor dyspnea on exertion. Reports no chest pain, pressure, or tightness. No edema, orthopnea, PND. Reports no palpitations.    Discussed the use of AI scribe software for clinical note transcription with the patient, who gave verbal consent to proceed.  ROS: Pertinent ROS in HPI  Studies Reviewed: .        Echocardiogram 09/25/2022: Normal LV systolic function with visual EF 60-65%. Left ventricle cavity is normal in size. Normal left  ventricular wall thickness. Normal global wall motion. Doppler evidence of grade II (pseudonormal) diastolic dysfunction, elevated LAP. Calculated EF 66%. Left atrial cavity is mildly dilated at 37.6 ml/m^2. Trace mitral regurgitation. Mild calcification of the mitral valve annulus. Structurally normal tricuspid valve.  Mild tricuspid regurgitation. Mild pulmonary hypertension. RVSP measures 37 mmHg. No prior available for comparison.        Physical Exam:   VS:  BP (!) 168/76   Pulse 76   Wt 140 lb (63.5 kg)   SpO2 98%   BMI 25.61 kg/m    Wt Readings from Last 3 Encounters:  05/23/23 140 lb (63.5 kg)  12/04/22 137 lb 9.6 oz (62.4 kg)  10/19/22 144 lb (65.3 kg)    GEN: Well nourished, well developed in no acute distress NECK: No JVD; No carotid bruits CARDIAC: RRR, no murmurs, rubs, gallops RESPIRATORY:  Clear to auscultation without rales, wheezing or rhonchi  ABDOMEN: Soft, non-tender, non-distended EXTREMITIES:  No edema; No deformity   ASSESSMENT AND PLAN: .        Hypertension Elevated blood pressure readings at home and in clinic. Currently on Amlodipine 10mg  daily, Hydralazine 25mg  TID, Isosorbide 15mg  BID, and Losartan 50mg  daily. Discussed potential need to increase Losartan if blood pressure remains consistently high. -Track blood pressure daily for a few days. -Consider increasing Losartan dose if blood pressure remains consistently high.  General Health Maintenance No new cardiac symptoms. No recent labs. Last labs were done by [name] six months ago. -Continue current cardiac medications. -Plan for  follow-up with [name] in six months for routine labs.  Mobility and Fall Risk Reports dizziness and uses a walker for stability. No recent falls. -Continue using walker for stability and fall prevention.   Dispo: She can follow-up in 6 months with [name]  Signed, Sharlene Dory, PA-C

## 2023-05-23 NOTE — Patient Instructions (Signed)
Medication Instructions:   Your physician recommends that you continue on your current medications as directed. Please refer to the Current Medication list given to you today.   *If you need a refill on your cardiac medications before your next appointment, please call your pharmacy*   Lab Work:  NONE ORDERED  TODAY    If you have labs (blood work) drawn today and your tests are completely normal, you will receive your results only by: MyChart Message (if you have MyChart) OR A paper copy in the mail If you have any lab test that is abnormal or we need to change your treatment, we will call you to review the results.   Testing/Procedures: NONE ORDERED  TODAY    Follow-Up: At [facility], you and your health needs are our priority.  As part of our continuing mission to provide you with exceptional heart care, we have created designated Provider Care Teams.  These Care Teams include your primary Cardiologist (physician) and Advanced Practice Providers (APPs -  Physician Assistants and Nurse Practitioners) who all work together to provide you with the care you need, when you need it.  We recommend signing up for the patient portal called "MyChart".  Sign up information is provided on this After Visit Summary.  MyChart is used to connect with patients for Virtual Visits (Telemedicine).  Patients are able to view lab/test results, encounter notes, upcoming appointments, etc.  Non-urgent messages can be sent to your provider as well.   To learn more about what you can do with MyChart, go to [email or web address].    Your next appointment:    6 month(s)  Provider:    Dr. Nadara Eaton  Other Instructions  KEEP BLOOD PRESSURE LOG FOR AA WEEK AND CALL AND GIVE LOG INFORMATION.

## 2023-05-29 ENCOUNTER — Other Ambulatory Visit: Payer: Self-pay | Admitting: Family Medicine

## 2023-05-29 DIAGNOSIS — I10 Essential (primary) hypertension: Secondary | ICD-10-CM

## 2023-05-29 MED ORDER — ISOSORBIDE DINITRATE 30 MG PO TABS: 15.0000 mg | ORAL_TABLET | Freq: Two times a day (BID) | ORAL | 3 refills | Status: DC

## 2023-05-29 NOTE — Telephone Encounter (Signed)
Copied from CRM [id number]. Topic: Clinical - Medication Refill >> May 29, 2023  9:39 AM Efraim Kaufmann C wrote: Most Recent Primary Care Visit:  Provider: Irving Shows A  Department: [facility]  Visit Type: CCM TELEPHONE CALL  Date: 01/04/2023  Medication: isosorbide dinitrate (ISORDIL) 30 MG tablet  Has the patient contacted their pharmacy? Yes (Agent: If no, request that the patient contact the pharmacy for the refill. If patient does not wish to contact the pharmacy document the reason why and proceed with request.) (Agent: If yes, when and what did the pharmacy advise?)  Is this the correct pharmacy for this prescription? Yes If no, delete pharmacy and type the correct one.  This is the patient's preferred pharmacy:  [facility] [address] [place], [place] - [address] [place] [address] Phone: [phone] Fax: [phone]   Has the prescription been filled recently? Yes  Is the patient out of the medication? Yes  Has the patient been seen for an appointment in the last year OR does the patient have an upcoming appointment? Yes  Can we respond through MyChart? No  Agent: Please be advised that Rx refills may take up to 3 business days. We ask that you follow-up with your pharmacy.

## 2023-05-29 NOTE — Telephone Encounter (Signed)
Advised patient that isosorbide dinitrate (ISORDIL) 30 MG tablet is managed by Yates Decamp, MD . I advised patient to reach out to their office at [phone] for refill. Routing message to Dr. Yates Decamp office due to patient and patient's son having trouble with reaching out to the office.

## 2023-05-30 ENCOUNTER — Telehealth: Payer: Self-pay | Admitting: Cardiology

## 2023-05-30 DIAGNOSIS — I10 Essential (primary) hypertension: Secondary | ICD-10-CM

## 2023-05-30 MED ORDER — ISOSORBIDE DINITRATE 30 MG PO TABS
15.0000 mg | ORAL_TABLET | Freq: Two times a day (BID) | ORAL | 3 refills | Status: DC
Start: 2023-05-30 — End: 2023-06-18

## 2023-05-30 NOTE — Telephone Encounter (Signed)
*  STAT* If patient is at the pharmacy, call can be transferred to refill team.   1. Which medications need to be refilled? (please list name of each medication and dose if known) (ISORDIL) 30 MG tablet    2. Would you like to learn more about the convenience, safety, & potential cost savings by using the [facility]? No     3. Are you open to using the [facility] (Type [facility]. NA.   4. Which pharmacy/location (including street and city if local pharmacy) is medication to be sent to?[id number] - [place], [place] - [address] [place] [address] Phone: [phone] Fax: [phone]  5. Do they need a 30 day or 90 day supply? 90

## 2023-05-30 NOTE — Telephone Encounter (Signed)
Pt's medication was sent to pt's pharmacy as requested. Confirmation received.  °

## 2023-06-18 ENCOUNTER — Encounter (HOSPITAL_BASED_OUTPATIENT_CLINIC_OR_DEPARTMENT_OTHER): Payer: Self-pay

## 2023-06-18 ENCOUNTER — Emergency Department (HOSPITAL_BASED_OUTPATIENT_CLINIC_OR_DEPARTMENT_OTHER)
Admission: EM | Admit: 2023-06-18 | Discharge: 2023-06-18 | Disposition: A | Discharge status: Home / Self Care | Payer: 59 | Attending: Emergency Medicine | Admitting: Emergency Medicine

## 2023-06-18 ENCOUNTER — Emergency Department (HOSPITAL_BASED_OUTPATIENT_CLINIC_OR_DEPARTMENT_OTHER): Payer: 59

## 2023-06-18 ENCOUNTER — Telehealth: Payer: Self-pay | Admitting: Cardiology

## 2023-06-18 ENCOUNTER — Other Ambulatory Visit: Payer: Self-pay

## 2023-06-18 DIAGNOSIS — I129 Hypertensive chronic kidney disease with stage 1 through stage 4 chronic kidney disease, or unspecified chronic kidney disease: Secondary | ICD-10-CM | POA: Diagnosis not present

## 2023-06-18 DIAGNOSIS — E039 Hypothyroidism, unspecified: Secondary | ICD-10-CM | POA: Insufficient documentation

## 2023-06-18 DIAGNOSIS — I10 Essential (primary) hypertension: Secondary | ICD-10-CM

## 2023-06-18 DIAGNOSIS — N1832 Chronic kidney disease, stage 3b: Secondary | ICD-10-CM | POA: Insufficient documentation

## 2023-06-18 DIAGNOSIS — R42 Dizziness and giddiness: Secondary | ICD-10-CM

## 2023-06-18 DIAGNOSIS — I1A0 Resistant hypertension: Secondary | ICD-10-CM

## 2023-06-18 DIAGNOSIS — Z79899 Other long term (current) drug therapy: Secondary | ICD-10-CM | POA: Insufficient documentation

## 2023-06-18 DIAGNOSIS — R918 Other nonspecific abnormal finding of lung field: Secondary | ICD-10-CM | POA: Diagnosis not present

## 2023-06-18 DIAGNOSIS — I700 Atherosclerosis of aorta: Secondary | ICD-10-CM | POA: Diagnosis not present

## 2023-06-18 DIAGNOSIS — G8929 Other chronic pain: Secondary | ICD-10-CM

## 2023-06-18 LAB — BASIC METABOLIC PANEL
Anion gap: 5 (ref 5–15)
BUN: 46 mg/dL — ABNORMAL HIGH (ref 8–23)
CO2: 27 mmol/L (ref 22–32)
Calcium: 8.8 mg/dL — ABNORMAL LOW (ref 8.9–10.3)
Chloride: 105 mmol/L (ref 98–111)
Creatinine, Ser: 2.52 mg/dL — ABNORMAL HIGH (ref 0.44–1.00)
GFR, Estimated: 17 mL/min — ABNORMAL LOW (ref >60)
Glucose, Bld: 97 mg/dL (ref 70–99)
Potassium: 4.5 mmol/L (ref 3.5–5.1)
Sodium: 137 mmol/L (ref 135–145)

## 2023-06-18 LAB — CBC
HCT: 34.8 % — ABNORMAL LOW (ref 36.0–46.0)
Hemoglobin: 11.2 g/dL — ABNORMAL LOW (ref 12.0–15.0)
MCH: 30.1 pg (ref 26.0–34.0)
MCHC: 32.2 g/dL (ref 30.0–36.0)
MCV: 93.5 fL (ref 80.0–100.0)
Platelets: 240 10*3/uL (ref 150–400)
RBC: 3.72 MIL/uL — ABNORMAL LOW (ref 3.87–5.11)
RDW: 13.2 % (ref 11.5–15.5)
WBC: 8.3 10*3/uL (ref 4.0–10.5)
nRBC: 0 % (ref 0.0–0.2)

## 2023-06-18 LAB — TROPONIN I (HIGH SENSITIVITY)
Troponin I (High Sensitivity): 16 ng/L (ref <18)
Troponin I (High Sensitivity): 17 ng/L (ref <18)

## 2023-06-18 MED ORDER — ISOSORBIDE DINITRATE 5 MG PO TABS: 15.0000 mg | ORAL_TABLET | Freq: Once | ORAL | Status: DC

## 2023-06-18 MED ORDER — AMLODIPINE BESYLATE 5 MG PO TABS
10.0000 mg | ORAL_TABLET | Freq: Once | ORAL | Status: AC
  Administered 2023-06-18: 10 mg via ORAL
  Filled 2023-06-18: qty 2

## 2023-06-18 MED ORDER — HYDRALAZINE HCL 20 MG/ML IJ SOLN
10.0000 mg | Freq: Once | INTRAMUSCULAR | Status: AC
  Administered 2023-06-18: 10 mg via INTRAVENOUS
  Filled 2023-06-18: qty 1

## 2023-06-18 MED ORDER — LOSARTAN POTASSIUM 50 MG PO TABS: 50.0000 mg | ORAL_TABLET | Freq: Every day | ORAL | 0 refills | Status: DC

## 2023-06-18 MED ORDER — LOSARTAN POTASSIUM 25 MG PO TABS
50.0000 mg | ORAL_TABLET | Freq: Once | ORAL | Status: AC
  Administered 2023-06-18: 50 mg via ORAL
  Filled 2023-06-18: qty 2

## 2023-06-18 MED ORDER — HYDRALAZINE HCL 25 MG PO TABS: 25.0000 mg | ORAL_TABLET | Freq: Three times a day (TID) | ORAL | 3 refills | Status: DC

## 2023-06-18 MED ORDER — AMLODIPINE BESYLATE 10 MG PO TABS: ORAL_TABLET | ORAL | 1 refills | Status: DC

## 2023-06-18 MED ORDER — ISOSORBIDE DINITRATE 30 MG PO TABS: 15.0000 mg | ORAL_TABLET | Freq: Two times a day (BID) | ORAL | 3 refills | Status: DC

## 2023-06-18 NOTE — ED Triage Notes (Signed)
 Pt brought in by her son. Reports elevated blood . Pt has been out of BP medication for 1 week. Office was suppose to call for appt yesterday, but didn't call her. Reports headache

## 2023-06-18 NOTE — ED Notes (Signed)
 Pt stating she started having lower abdominal pain radiating down leg. Denies chest pain or shortness of breath.

## 2023-06-18 NOTE — ED Notes (Signed)
 Son did not want to accompany to triage or room

## 2023-06-18 NOTE — ED Notes (Signed)
 2 person assist with walker to ambulate patient Patient stated that her right leg was tingling and ankle hurt and feeling dizzy Patients BP elevated 216/75 Patient complaining of left arm tingling down to fingers

## 2023-06-18 NOTE — Discharge Instructions (Addendum)
 You were seen for hypertension or high blood pressure. Your blood pressure improved to 153/108 after you were given some of your blood pressure medications. Refills for your 4 blood pressure medications (amlodipine , hydralazine , isosorbide  dinitrate, and losartan ) have been sent to your preferred pharmacy ([facility], [address]). Please be sure to resume your medications once you get your refills. We recommend that you reach out to your regular doctor (Dr. Sebastian), so he can make sure that you have all your medications and are taking your medications correctly. Please consider a pill box, pill packs, or using a mail-order pharmacy for easier refills.   Please return to the ED or call 911 if you experience a blood pressure higher than 180/110 that is accompanied by chest pain, changes in vision, weakness, back pain, or changes in speech as you may be experiencing a medical emergency.

## 2023-06-18 NOTE — ED Provider Notes (Signed)
 Repeat troponin 17.  Overall patient feeling much better.  Blood pressure has been somewhat labile but she is little bit anxious.  She has been without some of her blood pressure medications recently and I think her blood pressure will continue to improve as she gets back on her home medicines.  Patient discharged.  Follow-up with primary care doctor.  This chart was dictated using voice recognition software.  Despite best efforts to proofread,  errors can occur which can change the documentation meaning.    Ruthe Cornet, DO 06/18/23 1552

## 2023-06-18 NOTE — ED Notes (Signed)
 EDP aware of BP 194/75; states pt stable for d/c.

## 2023-06-18 NOTE — ED Notes (Signed)
 Discharge instructions reviewed with son. States understanding. Pt dressed, assisted to vehicle via wheelchair

## 2023-06-18 NOTE — Telephone Encounter (Signed)
 Pt c/o BP issue: STAT if pt c/o blurred vision, one-sided weakness or slurred speech  1. What are your last 5 BP readings? 225/103   2. Are you having any other symptoms (ex. Dizziness, headache, blurred vision, passed out)? Dizziness but some days headaches as well.   3. What is your BP issue? Pt's son is requesting a callback regarding her BP being so elevated lately. Please advise.

## 2023-06-18 NOTE — Telephone Encounter (Signed)
 Spoke with son an patient and he states her BP has been elevated for the past week. Today it was 237/116. She has headache and dizziness.  Advised to go the ED

## 2023-06-18 NOTE — ED Provider Notes (Signed)
 [facility] [place] Provider Note   CSN: [id number] Arrival date & time: 06/18/23  1208     History  Chief Complaint  Patient presents with   Hypertension    Stacy Huang is a 89 y.o. female with a medical history of hypertension, hyperlipidemia, CKD 3b, and hypothyroidism who presents for hypertension. She is accompanied by her adult son.    History obtained from patient and patient's son. Patient has been out of her blood pressure medications for the past week, with BP consistently over 200s systolic at home. Patient's son called PCP's office about refills but never received a call from the pharmacy to say they were ready for pickup. BP 241/80 on arrival to ED. She endorses headaches and dizziness with walking. Denies chest pain, heart palpitations, vision changes, dysuria, or N/V. Does endorse some abdominal upset. Patient follows with [facility] (Dr. Margaretann) and was recently seen in his office by another provider in December. She is on amlodipine  10 mg daily, hydralazine  25 mg TID, isosorbide  dinitrate 15 mg BID, and losartan  50 mg daily, but states she is taking the hydralazine  twice a day. Based on refill history, she received a 45-day supply of pindolol  10 mg BID 01/21/23 with no refills since. Patient manages her own medications (no pillbox, pill packs, receives medication refills each month).   BP improved to 218/79, 201/71 prior to administration of BP medications.   The history is provided by the patient and a relative.  Hypertension Associated symptoms include headaches. Pertinent negatives include no chest pain.       Home Medications Prior to Admission medications   Medication Sig Start Date End Date Taking? Authorizing Provider  amLODipine  (NORVASC ) 10 MG tablet TAKE 1 TABLET(10 MG) BY MOUTH EVERY MORNING 06/18/23   Arellano Zameza, Miral Hoopes, MD  diclofenac  Sodium (VOLTAREN ) 1 % GEL Apply 4 g topically 4 (four) times daily as  needed. 11/21/22   Sebastian Beverley NOVAK, MD  hydrALAZINE  (APRESOLINE ) 25 MG tablet Take 1 tablet (25 mg total) by mouth 3 (three) times daily. 06/18/23   Arellano Zameza, Daviel Allegretto, MD  isosorbide  dinitrate (ISORDIL ) 30 MG tablet Take 0.5 tablets (15 mg total) by mouth 2 (two) times daily. 06/18/23   Arellano Zameza, Wajiha Versteeg, MD  losartan  (COZAAR ) 50 MG tablet Take 1 tablet (50 mg total) by mouth daily. 06/18/23   Arellano Zameza, Sister Carbone, MD  pindolol  (VISKEN ) 10 MG tablet Take 1 tablet (10 mg total) by mouth 2 (two) times daily. 12/04/22   Ladona Heinz, MD      Allergies    Lactose and Lactose intolerance (gi)    Review of Systems   Review of Systems  Respiratory:  Negative for chest tightness.   Cardiovascular:  Negative for chest pain, palpitations and leg swelling.  Genitourinary:  Negative for dysuria.  Musculoskeletal:        Right ankle tenderness that is intermittent   Neurological:  Positive for dizziness and headaches.    Physical Exam Updated Vital Signs BP (!) 216/75   Pulse 80   Temp 97.7 F (36.5 C) (Oral)   Resp 18   Wt 65.3 kg   SpO2 100%   BMI 26.34 kg/m  Physical Exam HENT:     Head: Normocephalic and atraumatic.     Nose: Nose normal.     Mouth/Throat:     Mouth: Mucous membranes are moist.  Cardiovascular:     Rate and Rhythm: Normal rate and regular rhythm.  Pulses: Normal pulses.  Pulmonary:     Effort: Pulmonary effort is normal.  Abdominal:     General: Abdomen is flat.     Palpations: Abdomen is soft.  Musculoskeletal:     Comments: Trace edema lower extremities, slightly tender to palpation. Right ankle ROM WNL, strength at baseline, no additional tenderness on palpation, no bruising, obvious injury, or bleeding noted.   Skin:    General: Skin is warm and dry.  Neurological:     General: No focal deficit present.     Mental Status: She is alert.  Psychiatric:        Mood and Affect: Mood normal.        Behavior: Behavior normal.     ED  Results / Procedures / Treatments   Labs (all labs ordered are listed, but only abnormal results are displayed) Labs Reviewed  BASIC METABOLIC PANEL - Abnormal; Notable for the following components:      Result Value   BUN 46 (*)    Creatinine, Ser 2.52 (*)    Calcium 8.8 (*)    GFR, Estimated 17 (*)    All other components within normal limits  CBC - Abnormal; Notable for the following components:   RBC 3.72 (*)    Hemoglobin 11.2 (*)    HCT 34.8 (*)    All other components within normal limits  TROPONIN I (HIGH SENSITIVITY)  TROPONIN I (HIGH SENSITIVITY)    EKG EKG Interpretation Date/Time:  Tuesday June 18 2023 12:56:04 EST Ventricular Rate:  66 PR Interval:  174 QRS Duration:  77 QT Interval:  459 QTC Calculation: 481 R Axis:   63  Text Interpretation: Sinus rhythm Anteroseptal infarct, old Confirmed by Elnor Hila ([id number]) on 06/18/2023 1:49:03 PM  Radiology DG Chest Port 1 View Result Date: 06/18/2023 CLINICAL DATA:  Hypertension EXAM: PORTABLE CHEST - 1 VIEW COMPARISON:  04/23/2021 FINDINGS: Mild bibasilar interstitial opacities, right worse than left. No confluent airspace disease. Heart size upper limits normal for technique. Aortic Atherosclerosis (ICD10-170.0). No effusion. Visualized bones unremarkable. IMPRESSION: Mild bibasilar interstitial opacities. Electronically Signed   By: JONETTA Faes M.D.   On: 06/18/2023 14:21    Procedures Procedures    Medications Ordered in ED Medications  hydrALAZINE  (APRESOLINE ) injection 10 mg (0 mg Intravenous Hold 06/18/23 1417)  amLODipine  (NORVASC ) tablet 10 mg (10 mg Oral Given 06/18/23 1346)  hydrALAZINE  (APRESOLINE ) injection 10 mg (10 mg Intravenous Given 06/18/23 1350)  losartan  (COZAAR ) tablet 50 mg (50 mg Oral Given 06/18/23 1345)    ED Course/ Medical Decision Making/ A&P Clinical Course as of 06/18/23 1449  Tue Jun 18, 2023  372 89 year old presenting for hypertension not taking her home blood pressure medications  due to due to her primary care physician office being closed over the holiday.  Stable vital signs otherwise.  Arriving blood pressure 241/80.  No neurological deficits at this time.  Creatinine minimally elevated from baseline.  Troponin stable.  Diagnosis of poorly controlled hypertension.  Home prescriptions refilled.  Recommended to start taking immediately. [AG]    Clinical Course User Index [AG] Elnor Hila SQUIBB, DO                                 Medical Decision Making Patient is a 89 yo F who presents for poorly controlled hypertension in the context of missing about a week of her blood pressure medications. She has associated headache and  dizziness that seem to be more chronic in nature. Labs, ROS, and physical exam not concerning for hypertensive emergency. Blood pressure has been improving since admission without medication therapy. Patient was given IV hydralazine  10 mg x1, amlodipine  10 mg x1, and losartan  50 mg x1. Isosorbide  dinitrate not given as it is not available on formulary. BP and symptoms initially improved to 153/108. Repeated IV hydralazine  10 mg with BP improvement.   At this time patient stable for discharge home. Patient and patient's son agreeable to plan. Discussed importance of medication adherence with patient, including the use of a pill box to help with medication management, pill packs, and mail-order pharmacy. Also discussed importance of close follow up with primary care provider and cardiology to ensure patient is taking her medications as indicated as well as continued monitoring of BP at home. Provided general safety recommendations, including the use of a walker or supervision with walking to minimize risk of falling, good hydration for symptomatic improvement of dizziness spells, and resting or sitting down if she feels dizzy. Patient and patient's son expressed understanding. .   Amount and/or Complexity of Data Reviewed Labs: ordered. Radiology:  ordered.  Risk Prescription drug management.          Final Clinical Impression(s) / ED Diagnoses Final diagnoses:  Poorly-controlled hypertension  Dizziness    Rx / DC Orders ED Discharge Orders          Ordered    losartan  (COZAAR ) 50 MG tablet  Daily        06/18/23 1439    isosorbide  dinitrate (ISORDIL ) 30 MG tablet  2 times daily        06/18/23 1439    hydrALAZINE  (APRESOLINE ) 25 MG tablet  3 times daily        06/18/23 1439    amLODipine  (NORVASC ) 10 MG tablet        06/18/23 1439              Arellano Zameza, Dalana Pfahler, MD 06/18/23 1450

## 2023-06-18 NOTE — ED Notes (Signed)
 Pt reports R ankle pain, no new injury.  No swelling noted.  EDP made aware.

## 2023-06-25 ENCOUNTER — Encounter: Payer: Self-pay | Admitting: Family Medicine

## 2023-06-25 ENCOUNTER — Ambulatory Visit (INDEPENDENT_AMBULATORY_CARE_PROVIDER_SITE_OTHER): Payer: 59 | Admitting: Family Medicine

## 2023-06-25 VITALS — BP 160/84 | HR 101 | Temp 97.6°F | Wt 135.0 lb

## 2023-06-25 DIAGNOSIS — N184 Chronic kidney disease, stage 4 (severe): Secondary | ICD-10-CM | POA: Diagnosis not present

## 2023-06-25 DIAGNOSIS — I1A0 Resistant hypertension: Secondary | ICD-10-CM

## 2023-06-25 DIAGNOSIS — K5903 Drug induced constipation: Secondary | ICD-10-CM | POA: Diagnosis not present

## 2023-06-25 DIAGNOSIS — T887XXA Unspecified adverse effect of drug or medicament, initial encounter: Secondary | ICD-10-CM | POA: Diagnosis not present

## 2023-06-25 MED ORDER — POLYETHYLENE GLYCOL 3350 17 GM/SCOOP PO POWD: 17.0000 g | Freq: Every day | ORAL | 0 refills | Status: AC

## 2023-06-25 NOTE — Progress Notes (Signed)
 Assessment/Plan:   Problem List Items Addressed This Visit       Cardiovascular and Mediastinum   Resistant hypertension - Primary   Essential hypertension complicated by chronic kidney disease. Blood pressure management requires adjustment due to adverse effects from current regimen.  Plan: Refer to hypertension clinic for specialized management of antihypertensive therapy in the context of chronic kidney disease. Continue current antihypertensive medications excluding hydralazine . Monitor blood pressure regularly.      Relevant Orders   Ambulatory referral to [facility]     Digestive   Drug-induced constipation   Patient presents with a 2-3 day history of constipation, bloating, and a sensation of needing to vomit without actual emesis. Also with headache and worsening dizziness (long standing). Symptoms began after initiating hydralazine . Likely medication-induced constipation secondary to hydralazine .  Plan: Start polyethylene glycol 3350  (Miralax ) powder, one capful orally once daily until bowel movements normalize. Encourage increased dietary fiber and fluid intake. Advise patient to monitor symptoms and seek care if no improvement or if symptoms worsen, including severe abdominal pain or vomiting. Reassess at next visit or sooner if symptoms persist or worsen.      Relevant Medications   polyethylene glycol powder (GLYCOLAX /MIRALAX ) 17 GM/SCOOP powder     Genitourinary   Stage 4 chronic kidney disease (HCC)   Other Visit Diagnoses       Side effect of drug       Relevant Orders   Ambulatory referral to [facility]pert[facility]       Medications Discontinued During This Encounter  Medication Reason   hydrALAZINE  (APRESOLINE ) 25 MG tablet     Return if symptoms worsen or fail to improve.    Subjective:   Encounter date: 06/25/2023  Stacy Huang is a 89 y.o. female who has Other fatigue; Stage 3b chronic kidney disease (HCC);  History of deep vein thrombosis (DVT) of lower extremity; Hypothyroidism; Hyperlipidemia; Compression fracture of L1 vertebra (HCC); Dizziness and giddiness; Hearing loss of both ears due to cerumen impaction; Resistant hypertension; Dizziness; Vertigo; Frail elderly; Difficulty transferring from wheelchair to chair; Glaucoma of right eye; Nocturnal leg cramps; Stage 4 chronic kidney disease (HCC); Chronic nonintractable headache; Postmenopausal estrogen deficiency; Chronic pain of right knee; and Drug-induced constipation on their problem list..   She  has a past medical history of Acute hypoxemic respiratory failure due to COVID-19 [facility]) (02/27/2020), Essential hypertension (04/21/2021), Hypertension, Hypertensive urgency (02/27/2020), Shoulder fracture, right, and Thyroid  disease..   Chief Complaint: Constipation  History of Present Illness:  Patient presents with a 2-3 day history of constipation, reporting a sensation of something hung up and bloating. Feels like needing to vomit but has not experienced emesis. Denies difficulty swallowing. Reports new-onset headaches and worsening long-standing dizziness. Recently started hydralazine  25 mg three times daily approximately one week ago after an emergency department visit for elevated blood pressure. Since starting hydralazine , has noted increased headaches, dizziness, and gastrointestinal symptoms. Has tried over-the-counter laxatives with minimal relief. Has a history of chronic kidney disease and is currently on multiple antihypertensive medications including amlodipine , isosorbide , pindolol , and losartan .  Review of Systems:  Constitutional: Denies fever or chills. Eyes: No visual changes. ENT: Denies difficulty swallowing. Cardiovascular: Reports long-standing dizziness. Respiratory: No cough or shortness of breath. Gastrointestinal: Reports constipation, bloating, and sensation of needing to vomit without actual vomiting. Genitourinary:  No urinary symptoms. Neurological: Reports headaches and dizziness. Psychiatric: No mood changes. Skin: No rashes or lesions. Musculoskeletal: No joint pain or swelling. Endocrine: No polyuria  or polydipsia. Hematologic/Lymphatic: No bleeding or bruising. Allergic/Immunologic: No known allergies. All other systems reviewed and negative.  No past surgical history on file.  Outpatient Medications Prior to Visit  Medication Sig Dispense Refill   amLODipine  (NORVASC ) 10 MG tablet TAKE 1 TABLET(10 MG) BY MOUTH EVERY MORNING 90 tablet 1   isosorbide  dinitrate (ISORDIL ) 30 MG tablet Take 0.5 tablets (15 mg total) by mouth 2 (two) times daily. 90 tablet 3   losartan  (COZAAR ) 50 MG tablet Take 1 tablet (50 mg total) by mouth daily. 90 tablet 0   pindolol  (VISKEN ) 10 MG tablet Take 1 tablet (10 mg total) by mouth 2 (two) times daily. 90 tablet 2   hydrALAZINE  (APRESOLINE ) 25 MG tablet Take 1 tablet (25 mg total) by mouth 3 (three) times daily. 270 tablet 3   diclofenac  Sodium (VOLTAREN ) 1 % GEL Apply 4 g topically 4 (four) times daily as needed. (Patient not taking: Reported on 06/25/2023) 100 g 3   No facility-administered medications prior to visit.    Family History  Problem Relation Age of Onset   Alzheimer's disease Mother    Hypertension Father    Other Sister        Died at birth   Cancer Brother        spinal   Heart attack Brother     Social History   Socioeconomic History   Marital status: Widowed    Spouse name: Not on file   Number of children: 1   Years of education: Not on file   Highest education level: Not on file  Occupational History   Not on file  Tobacco Use   Smoking status: Never    Passive exposure: Never   Smokeless tobacco: Never  Vaping Use   Vaping status: Never Used  Substance and Sexual Activity   Alcohol use: Never   Drug use: Never   Sexual activity: Not on file  Other Topics Concern   Not on file  Social History Narrative   Not on file    Social Drivers of Health   Financial Resource Strain: Low Risk  (10/19/2022)   Overall Financial Resource Strain (CARDIA)    Difficulty of Paying Living Expenses: Not hard at all  Food Insecurity: No Food Insecurity (10/19/2022)   Hunger Vital Sign    Worried About Running Out of Food in the Last Year: Never true    Ran Out of Food in the Last Year: Never true  Transportation Needs: No Transportation Needs (10/19/2022)   PRAPARE - Administrator, Civil Service (Medical): No    Lack of Transportation (Non-Medical): No  Physical Activity: Inactive (10/19/2022)   Exercise Vital Sign    Days of Exercise per Week: 0 days    Minutes of Exercise per Session: 0 min  Stress: No Stress Concern Present (10/19/2022)   [employer] of Occupational Health - Occupational Stress Questionnaire    Feeling of Stress : Not at all  Social Connections: Moderately Isolated (08/07/2022)   Social Connection and Isolation Panel [NHANES]    Frequency of Communication with Friends and Family: Twice a week    Frequency of Social Gatherings with Friends and Family: Once a week    Attends Religious Services: 1 to 4 times per year    Active Member of [employer] or Organizations: No    Attends Banker Meetings: Never    Marital Status: Widowed  Intimate Partner Violence: Not At Risk (08/07/2022)   Humiliation, Afraid, Rape,  and Kick questionnaire    Fear of Current or Ex-Partner: No    Emotionally Abused: No    Physically Abused: No    Sexually Abused: No                                                                                                  Objective:  Physical Exam: BP (!) 154/86   Pulse (!) 101   Temp 97.6 F (36.4 C) (Temporal)   Wt 135 lb (61.2 kg)   SpO2 97%   BMI 24.69 kg/m     Physical Exam Constitutional:      General: She is not in acute distress.    Appearance: Normal appearance. She is not ill-appearing or toxic-appearing.     Comments: Patient is  wheelchair-bound  HENT:     Head: Normocephalic and atraumatic.     Nose: Nose normal. No congestion.  Eyes:     General: No scleral icterus.    Extraocular Movements: Extraocular movements intact.  Cardiovascular:     Rate and Rhythm: Normal rate and regular rhythm.     Pulses: Normal pulses.     Heart sounds: Normal heart sounds.  Pulmonary:     Effort: Pulmonary effort is normal. No respiratory distress.     Breath sounds: Normal breath sounds.  Abdominal:     General: Abdomen is flat. Bowel sounds are normal.     Palpations: Abdomen is soft.  Musculoskeletal:        General: Normal range of motion.  Lymphadenopathy:     Cervical: No cervical adenopathy.  Skin:    General: Skin is warm and dry.     Findings: No rash.  Neurological:     General: No focal deficit present.     Mental Status: She is alert and oriented to person, place, and time. Mental status is at baseline.  Psychiatric:        Mood and Affect: Mood normal.        Behavior: Behavior normal.        Thought Content: Thought content normal.        Judgment: Judgment normal.     DG Chest Port 1 View Result Date: 06/18/2023 CLINICAL DATA:  Hypertension EXAM: PORTABLE CHEST - 1 VIEW COMPARISON:  04/23/2021 FINDINGS: Mild bibasilar interstitial opacities, right worse than left. No confluent airspace disease. Heart size upper limits normal for technique. Aortic Atherosclerosis (ICD10-170.0). No effusion. Visualized bones unremarkable. IMPRESSION: Mild bibasilar interstitial opacities. Electronically Signed   By: JONETTA Faes M.D.   On: 06/18/2023 14:21    Recent Results (from the past 2160 hours)  Basic metabolic panel     Status: Abnormal   Collection Time: 06/18/23 12:54 PM  Result Value Ref Range   Sodium 137 135 - 145 mmol/L   Potassium 4.5 3.5 - 5.1 mmol/L   Chloride 105 98 - 111 mmol/L   CO2 27 22 - 32 mmol/L   Glucose, Bld 97 70 - 99 mg/dL    Comment: Glucose reference range applies only to samples taken  after fasting for at least 8 hours.  BUN 46 (H) 8 - 23 mg/dL   Creatinine, Ser 7.47 (H) 0.44 - 1.00 mg/dL   Calcium 8.8 (L) 8.9 - 10.3 mg/dL   GFR, Estimated 17 (L) >60 mL/min    Comment: (NOTE) Calculated using the CKD-EPI Creatinine Equation (2021)    Anion gap 5 5 - 15    Comment: Performed at [facility], [address] [place], [place] [address]  CBC     Status: Abnormal   Collection Time: 06/18/23 12:54 PM  Result Value Ref Range   WBC 8.3 4.0 - 10.5 K/uL   RBC 3.72 (L) 3.87 - 5.11 MIL/uL   Hemoglobin 11.2 (L) 12.0 - 15.0 g/dL   HCT 65.1 (L) 63.9 - 53.9 %   MCV 93.5 80.0 - 100.0 fL   MCH 30.1 26.0 - 34.0 pg   MCHC 32.2 30.0 - 36.0 g/dL   RDW 86.7 88.4 - 84.4 %   Platelets 240 150 - 400 K/uL   nRBC 0.0 0.0 - 0.2 %    Comment: Performed at [facility], [address] [place], [place] [address]  Troponin I (High Sensitivity)     Status: None   Collection Time: 06/18/23 12:54 PM  Result Value Ref Range   Troponin I (High Sensitivity) 16 <18 ng/L    Comment: (NOTE) Elevated high sensitivity troponin I (hsTnI) values and significant  changes across serial measurements may suggest ACS but many other  chronic and acute conditions are known to elevate hsTnI results.  Refer to the Links section for chest pain algorithms and additional  guidance. Performed at [facility], [address] [place], [place] [address]   Troponin I (High Sensitivity)     Status: None   Collection Time: 06/18/23  3:08 PM  Result Value Ref Range   Troponin I (High Sensitivity) 17 <18 ng/L    Comment: (NOTE) Elevated high sensitivity troponin I (hsTnI) values and significant  changes across serial measurements may suggest ACS but many other  chronic and acute conditions are known to elevate hsTnI results.  Refer to the Links section for chest pain algorithms and additional  guidance. Performed at [facility], [address] [place],  [place] [address]         Beverley Adine Hummer, MD, MS

## 2023-06-25 NOTE — Patient Instructions (Signed)
-   Reduce your hydralazine  dose to 25?mg twice daily for 3 days, then once daily for 3 days, and then stop taking it. - Take one capful of MiraLAX  powder daily to help relieve constipation. - Monitor your blood pressure at home while reducing hydralazine , and record your readings. - Schedule an appointment with the blood pressure clinic for further blood pressure management. - Return to the clinic if your symptoms, including constipation or dizziness, do not improve.

## 2023-06-25 NOTE — Assessment & Plan Note (Signed)
 Essential hypertension complicated by chronic kidney disease. Blood pressure management requires adjustment due to adverse effects from current regimen.  Plan: Refer to hypertension clinic for specialized management of antihypertensive therapy in the context of chronic kidney disease. Continue current antihypertensive medications excluding hydralazine . Monitor blood pressure regularly.

## 2023-06-25 NOTE — Assessment & Plan Note (Addendum)
 Patient presents with a 2-3 day history of constipation, bloating, and a sensation of needing to vomit without actual emesis. Also with headache and worsening dizziness (long standing). Symptoms began after initiating hydralazine . Likely medication-induced constipation secondary to hydralazine .  Plan: Start polyethylene glycol 3350  (Miralax ) powder, one capful orally once daily until bowel movements normalize. Encourage increased dietary fiber and fluid intake. Advise patient to monitor symptoms and seek care if no improvement or if symptoms worsen, including severe abdominal pain or vomiting. Reassess at next visit or sooner if symptoms persist or worsen.

## 2023-07-03 ENCOUNTER — Ambulatory Visit: Payer: 59 | Admitting: Cardiology

## 2023-07-03 NOTE — Progress Notes (Deleted)
Cardiology Office Note:  .   Date:  07/03/2023  ID:  Stacy Huang, DOB 02-10-1921, MRN [id number] PCP: Garnette Gunner, MD  [facility] Providers Cardiologist:  None { Click to update primary MD,subspecialty MD or APP then REFRESH:1}  History of Present Illness: .   Stacy Huang is a 89 y.o. female with a past medical history of hypertension, hyperlipidemia, chronic stage IIIb kidney disease, hypothyroidism, chronic headaches and dizziness presents for difficult to control hypertension.   Discussed the use of AI scribe software for clinical note transcription with the patient, who gave verbal consent to proceed.  History of Present Illness            Labs   Lab Results  Component Value Date   TRIG 146 02/26/2020   Lab Results  Component Value Date   NA 137 06/18/2023   K 4.5 06/18/2023   CO2 27 06/18/2023   GLUCOSE 97 06/18/2023   BUN 46 (H) 06/18/2023   CREATININE 2.52 (H) 06/18/2023   CALCIUM 8.8 (L) 06/18/2023   GFR 18.33 (L) 11/21/2022   EGFR 20 (L) 12/25/2021   GFRNONAA 17 (L) 06/18/2023      Latest Ref Rng & Units 06/18/2023   12:54 PM 11/21/2022   11:32 AM 12/25/2021    9:23 AM  BMP  Glucose 70 - 99 mg/dL 97  90  130   BUN 8 - 23 mg/dL 46  39  39   Creatinine 0.44 - 1.00 mg/dL 8.65  7.84  6.96   BUN/Creat Ratio 12 - 28   18   Sodium 135 - 145 mmol/L 137  140  142   Potassium 3.5 - 5.1 mmol/L 4.5  4.9  4.3   Chloride 98 - 111 mmol/L 105  107  105   CO2 22 - 32 mmol/L 27  24  21    Calcium 8.9 - 10.3 mg/dL 8.8  9.5  9.4       Latest Ref Rng & Units 06/18/2023   12:54 PM 11/21/2022   11:32 AM 04/25/2021    4:52 AM  CBC  WBC 4.0 - 10.5 K/uL 8.3  8.2  11.6   Hemoglobin 12.0 - 15.0 g/dL 29.5  28.4  8.9   Hematocrit 36.0 - 46.0 % 34.8  36.1  26.8   Platelets 150 - 400 K/uL 240  281.0  312    External Labs:  ***  ***ROS  Physical Exam:   VS:  There were no vitals taken for this visit.   Wt Readings from Last 3 Encounters:  06/25/23 135  lb (61.2 kg)  06/18/23 144 lb (65.3 kg)  05/23/23 140 lb (63.5 kg)     ***Physical Exam  Studies Reviewed: .    *** EKG:         ***  Medications and allergies    Allergies  Allergen Reactions   Hydralazine Nausea Only    Constipation, Headache, and Dizziness   Lactose Nausea Only and Other (See Comments)    GI Upset- Upsets the stomach   Lactose Intolerance (Gi) Other (See Comments)    Upsets the stomach     Current Outpatient Medications:    amLODipine (NORVASC) 10 MG tablet, TAKE 1 TABLET(10 MG) BY MOUTH EVERY MORNING, Disp: 90 tablet, Rfl: 1   diclofenac Sodium (VOLTAREN) 1 % GEL, Apply 4 g topically 4 (four) times daily as needed. (Patient not taking: Reported on 06/25/2023), Disp: 100 g, Rfl: 3   isosorbide dinitrate (ISORDIL)  30 MG tablet, Take 0.5 tablets (15 mg total) by mouth 2 (two) times daily., Disp: 90 tablet, Rfl: 3   losartan (COZAAR) 50 MG tablet, Take 1 tablet (50 mg total) by mouth daily., Disp: 90 tablet, Rfl: 0   pindolol (VISKEN) 10 MG tablet, Take 1 tablet (10 mg total) by mouth 2 (two) times daily., Disp: 90 tablet, Rfl: 2   polyethylene glycol powder (GLYCOLAX/MIRALAX) 17 GM/SCOOP powder, Take 17 g by mouth daily., Disp: 500 g, Rfl: 0   ASSESSMENT AND PLAN: .      ICD-10-CM   1. Primary hypertension  I10     2. Stage 3b chronic kidney disease (HCC)  N18.32       1. Primary hypertension ***  2. Stage 3b chronic kidney disease [facility]) ***  Assessment and Plan                 Signed,  Yates Decamp, MD, [facility] 07/03/2023, 5:42 AM [facility] [address] [place], [place] [address] Phone: [phone]. Fax:  [phone]

## 2023-08-15 ENCOUNTER — Ambulatory Visit (INDEPENDENT_AMBULATORY_CARE_PROVIDER_SITE_OTHER): Payer: 59 | Admitting: Family

## 2023-08-15 ENCOUNTER — Encounter (HOSPITAL_BASED_OUTPATIENT_CLINIC_OR_DEPARTMENT_OTHER): Payer: Self-pay | Admitting: Family

## 2023-08-15 VITALS — BP 189/71 | HR 68 | Ht 63.0 in | Wt 138.6 lb

## 2023-08-15 DIAGNOSIS — I10 Essential (primary) hypertension: Secondary | ICD-10-CM

## 2023-08-15 DIAGNOSIS — N184 Chronic kidney disease, stage 4 (severe): Secondary | ICD-10-CM | POA: Diagnosis not present

## 2023-08-15 DIAGNOSIS — R42 Dizziness and giddiness: Secondary | ICD-10-CM

## 2023-08-15 MED ORDER — ISOSORBIDE MONONITRATE ER 30 MG PO TB24: 30.0000 mg | ORAL_TABLET | Freq: Every day | ORAL | 11 refills | Status: DC

## 2023-08-15 NOTE — Patient Instructions (Signed)
 Medication Instructions:  Your physician has recommended you make the following change in your medication:   Stop: Isordil   Stop: Pindolol   Start: Imdur 30mg  daily   *If you need a refill on your cardiac medications before your next appointment, please call your pharmacy*   Lab Work: Your physician recommends that you return for lab work today- BMP, mag, Renin-aldosterone and thyroid panel    Testing/Procedures: Your physician has requested that you have a carotid duplex. This test is an ultrasound of the carotid arteries in your neck. It looks at blood flow through these arteries that supply the brain with blood. Allow one hour for this exam. There are no restrictions or special instructions.   Follow-Up: At [facility], you and your health needs are our priority.  As part of our continuing mission to provide you with exceptional heart care, we have created designated Provider Care Teams.  These Care Teams include your primary Cardiologist (physician) and Advanced Practice Providers (APPs -  Physician Assistants and Nurse Practitioners) who all work together to provide you with the care you need, when you need it.  We recommend signing up for the patient portal called "MyChart".  Sign up information is provided on this After Visit Summary.  MyChart is used to connect with patients for Virtual Visits (Telemedicine).  Patients are able to view lab/test results, encounter notes, upcoming appointments, etc.  Non-urgent messages can be sent to your provider as well.   To learn more about what you can do with MyChart, go to [email or web address].    Your next appointment:   3 month(s)  Provider:   Chilton Si, MD or Gillian Shields, NP In [facility]IN[facility]

## 2023-08-15 NOTE — Progress Notes (Signed)
 [facility] Initial Assessment:    Date:  08/15/2023   ID:  Stacy Huang, DOB August 24, 1920, MRN [id number]  PCP:  Garnette Gunner, MD  Cardiologist:  Yates Decamp, MD  Nephrologist:  Referring MD: Garnette Gunner, MD   CC: Hypertension  History of Present Illness:    Stacy Huang is a 89 y.o. female with a hx of hypertension, hyperlipidemia, CKD 3B, hypothyroidism here to establish care in the [facility].  Follows with [name].  Prior echocardiogram April 2024 normal LVEF 60 to 65%,gr2dd, trace MR, mild TR, mild pulmonary hypertension.  ED visit 06/18/2023 with hypertension.  She had been out of her blood pressure medications for the past week with BP at home routinely greater than 200.  She was noted to not picked up pindolol since August 2024.  Refills of amlodipine, Isordil, losartan were provided.  She saw primary care 06/25/2023 and was referred to [facility].   Stacy Huang was diagnosed with hypertension in her 36s. It has been difficult to control. Blood pressure checked with arm cuff at home. Readings have been 170/60-70. she reports tobacco use never. Alcohol use never. For exercise she walks around her home. Somewhat limited by knee pain from arthritis. Per her son she "piddles around all day". Sleeps well and wakes feeling well rested. Longstanding history of dizziness which she describes as feeling off balance when she is up moving. Taking her medications once per day in the morning. Does not use pill pack, simply leaves the bottles on the kitchen table to take around 9:30am with her breakfast.   She reports her constipation has resolved since discontinuing hydralazine.  She is not requiring MiraLAX and only having to utilize prune juice twice per week.  She notes some cramping in her bilateral legs in the evening which is often relieved by getting up and walking.  No chest pain, pressure, tightness.  No dyspnea on  exertion, edema, orthopnea, PND.  Previous antihypertensives: Lisinopril-HCTZ - stopped due to AKI Hydralazine - constipation  Past Medical History:  Diagnosis Date   Acute hypoxemic respiratory failure due to COVID-19 (HCC) 02/27/2020   Essential hypertension 04/21/2021   Hypertension    Hypertensive urgency 02/27/2020   Shoulder fracture, right    Thyroid disease     History reviewed. No pertinent surgical history.  Current Medications: Current Meds  Medication Sig   amLODipine (NORVASC) 10 MG tablet TAKE 1 TABLET(10 MG) BY MOUTH EVERY MORNING   diclofenac Sodium (VOLTAREN) 1 % GEL Apply 4 g topically 4 (four) times daily as needed.   isosorbide mononitrate (IMDUR) 30 MG 24 hr tablet Take 1 tablet (30 mg total) by mouth daily.   losartan (COZAAR) 50 MG tablet Take 1 tablet (50 mg total) by mouth daily.   polyethylene glycol powder (GLYCOLAX/MIRALAX) 17 GM/SCOOP powder Take 17 g by mouth daily.   [DISCONTINUED] isosorbide dinitrate (ISORDIL) 30 MG tablet Take 0.5 tablets (15 mg total) by mouth 2 (two) times daily.   [DISCONTINUED] pindolol (VISKEN) 10 MG tablet Take 1 tablet (10 mg total) by mouth 2 (two) times daily.     Allergies:   Hydralazine, Lactose, and Lactose intolerance (gi)   Social History   Socioeconomic History   Marital status: Widowed    Spouse name: Not on file   Number of children: 1   Years of education: Not on file   Highest education level: Not on file  Occupational History   Not on  file  Tobacco Use   Smoking status: Never    Passive exposure: Never   Smokeless tobacco: Never  Vaping Use   Vaping status: Never Used  Substance and Sexual Activity   Alcohol use: Never   Drug use: Never   Sexual activity: Not on file  Other Topics Concern   Not on file  Social History Narrative   Not on file   Social Drivers of Health   Financial Resource Strain: Low Risk  (10/19/2022)   Overall Financial Resource Strain (CARDIA)    Difficulty of Paying  Living Expenses: Not hard at all  Food Insecurity: No Food Insecurity (10/19/2022)   Hunger Vital Sign    Worried About Running Out of Food in the Last Year: Never true    Ran Out of Food in the Last Year: Never true  Transportation Needs: No Transportation Needs (10/19/2022)   PRAPARE - Administrator, Civil Service (Medical): No    Lack of Transportation (Non-Medical): No  Physical Activity: Inactive (10/19/2022)   Exercise Vital Sign    Days of Exercise per Week: 0 days    Minutes of Exercise per Session: 0 min  Stress: No Stress Concern Present (10/19/2022)   [employer] of Occupational Health - Occupational Stress Questionnaire    Feeling of Stress : Not at all  Social Connections: Moderately Isolated (08/07/2022)   Social Connection and Isolation Panel [NHANES]    Frequency of Communication with Friends and Family: Twice a week    Frequency of Social Gatherings with Friends and Family: Once a week    Attends Religious Services: 1 to 4 times per year    Active Member of [employer] or Organizations: No    Attends Banker Meetings: Never    Marital Status: Widowed     Family History: The patient's family history includes Alzheimer's disease in her mother; Cancer in her brother; Heart attack in her brother; Hypertension in her father; Other in her sister.  ROS:   Please see the history of present illness.     All other systems reviewed and are negative.  EKGs/Labs/Other Studies Reviewed:         Recent Labs: 11/21/2022: ALT 15; TSH 13.12 06/18/2023: BUN 46; Creatinine, Ser 2.52; Hemoglobin 11.2; Platelets 240; Potassium 4.5; Sodium 137   Recent Lipid Panel    Component Value Date/Time   TRIG 146 02/26/2020 1620    Physical Exam:   VS:  BP (!) 189/71 Comment: left arm  Pulse 68   Ht 5\' 3"  (1.6 m)   Wt 138 lb 9.6 oz (62.9 kg)   SpO2 97%   BMI 24.55 kg/m  , BMI Body mass index is 24.55 kg/m. GENERAL:  Well appearing HEENT: Pupils equal round  and reactive, fundi not visualized, oral mucosa unremarkable NECK:  No jugular venous distention, waveform within normal limits, carotid upstroke brisk and symmetric, no bruits, no thyromegaly LYMPHATICS:  No cervical adenopathy LUNGS:  Clear to auscultation bilaterally HEART:  RRR.  PMI not displaced or sustained,S1 and S2 within normal limits, no S3, no S4, no clicks, no rubs, no murmurs ABD:  Flat, positive bowel sounds normal in frequency in pitch, no bruits, no rebound, no guarding, no midline pulsatile mass, no hepatomegaly, no splenomegaly EXT:  2 plus pulses throughout, no edema, no cyanosis no clubbing SKIN:  No rashes no nodules NEURO:  Cranial nerves II through XII grossly intact, motor grossly intact throughout PSYCH:  Cognitively intact, oriented to person place  and time   ASSESSMENT/PLAN:    HTN - BP not at goal. Less than 140/90 reasonable goal given her age. Only taking medications once per day. Continue Amlodipine 10mg  daily, Losartan 50mg  daily. Stop Isordil. Start Isosorbide mononitrate 30mg  daily for easier daily dosing. May need to increase at follow up. Given age, frailty, and propensity to dizziness prefer slow careful changes in her medication regimen. Remove Pindolol from medication last as last picked up 01/2023. Once daily agents preferred however Nebivolol is $47/month on her insurance plan. If additional BP control needed and agreeable to BID dosing, consider Carvedilol in the future.  Order carotid duplex due to dizziness and asymmetric BP. No bruit noted on exam. Labs today: thyroid panel (elevated TSH 11/2022),  renin aldosterone for secondary hypertension workup.  CKDIIIb-IV - Careful titration of diuretic and antihypertensive.  BMET today. May need to consider discontinuation of Losartan if renal function continues to decline.  Leg cramps - notable at night. Update BMET, magnesium to r/o electrolyte abnormalities.   Screening for Secondary Hypertension:      08/15/2023   10:11 AM  Causes  Renovascular HTN Not Screened     - Comments Consider at f/u if BP remains difficult to control. Low suspicion she would want intervention if noted.  Sleep Apnea N/A     - Comments sleeps well, no daytime somnolence  Thyroid Disease Screened     - Comments 08/2023 thyroid panel ordered  Hyperaldosteronism Screened     - Comments 08/2023 renin aldosterone panel ordered.  Pheochromocytoma N/A     - Comments CT 2022 normal adrenals  Cushing's Syndrome N/A     - Comments Non cushingoid appearance  Coarctation of the Aorta Screened     - Comments 08/15/23 SBP with 13 point difference between her 2 arms.  Plan for carotid duplex.  Compliance Screened     - Comments Noted to be taking medications only once per day    Relevant Labs/Studies:    Latest Ref Rng & Units 06/18/2023   12:54 PM 11/21/2022   11:32 AM 12/25/2021    9:23 AM  Basic Labs  Sodium 135 - 145 mmol/L 137  140  142   Potassium 3.5 - 5.1 mmol/L 4.5  4.9  4.3   Creatinine 0.44 - 1.00 mg/dL 1.61  0.96  0.45        Latest Ref Rng & Units 11/21/2022   11:32 AM  Thyroid   TSH 0.35 - 5.50 uIU/mL 13.12                  Disposition:    FU with [name] as scheduled in 1 month and [facility] in 3 months   Medication Adjustments/Labs and Tests Ordered: Current medicines are reviewed at length with the patient today.  Concerns regarding medicines are outlined above.  Orders Placed This Encounter  Procedures   Aldosterone + renin activity w/ ratio   Thyroid Panel With TSH   Basic metabolic panel   Magnesium   VAS US CAROTID   Meds ordered this encounter  Medications   isosorbide mononitrate (IMDUR) 30 MG 24 hr tablet    Sig: Take 1 tablet (30 mg total) by mouth daily.    Dispense:  30 tablet    Refill:  11    D/C ISORDIL & PINDOLOL     Signed, Alver Sorrow, NP  08/15/2023 10:20 AM    [facility]

## 2023-08-18 DIAGNOSIS — I6782 Cerebral ischemia: Secondary | ICD-10-CM | POA: Diagnosis not present

## 2023-08-18 DIAGNOSIS — I10 Essential (primary) hypertension: Secondary | ICD-10-CM | POA: Diagnosis not present

## 2023-08-18 DIAGNOSIS — R42 Dizziness and giddiness: Secondary | ICD-10-CM | POA: Diagnosis not present

## 2023-08-18 DIAGNOSIS — R030 Elevated blood-pressure reading, without diagnosis of hypertension: Secondary | ICD-10-CM | POA: Diagnosis not present

## 2023-08-18 DIAGNOSIS — R9082 White matter disease, unspecified: Secondary | ICD-10-CM | POA: Diagnosis not present

## 2023-08-18 DIAGNOSIS — R519 Headache, unspecified: Secondary | ICD-10-CM | POA: Diagnosis not present

## 2023-08-18 DIAGNOSIS — I6523 Occlusion and stenosis of bilateral carotid arteries: Secondary | ICD-10-CM | POA: Diagnosis not present

## 2023-08-19 ENCOUNTER — Telehealth: Payer: Self-pay | Admitting: Family Medicine

## 2023-08-19 ENCOUNTER — Ambulatory Visit: Payer: Self-pay | Admitting: Family Medicine

## 2023-08-19 DIAGNOSIS — I1A0 Resistant hypertension: Secondary | ICD-10-CM

## 2023-08-19 DIAGNOSIS — I10 Essential (primary) hypertension: Secondary | ICD-10-CM

## 2023-08-19 MED ORDER — LOSARTAN POTASSIUM 50 MG PO TABS: 50.0000 mg | ORAL_TABLET | Freq: Every day | ORAL | 0 refills | Status: DC

## 2023-08-19 MED ORDER — AMLODIPINE BESYLATE 10 MG PO TABS: ORAL_TABLET | ORAL | 1 refills | Status: DC

## 2023-08-19 NOTE — Telephone Encounter (Signed)
 Copied from CRM [id number]. Topic: Clinical - Red Word Triage >> Aug 19, 2023  2:11 PM [place] A wrote: [facility] that prompted transfer to Nurse Triage: patient is dizzy, has headache and has not had High Blood Pressure Medication in days because a different doctor told her not to take medication. Patient went to ER yesterday but is not better   Chief Complaint: medication question Symptoms: no symptoms Frequency: n/a Pertinent Negatives: Patient denies chestpain or dizziness Disposition: [] ED /[] Urgent Care (no appt availability in office) / [] Appointment(In office/virtual)/ []  [facility]/ [] Home Care/ [] Refused Recommended Disposition /[] [facility]/ [x]  Follow-up with PCP Additional Notes: Patient's friend Frederik Pear [phone]) states that she is having diff picking up pts medications. Sts that nothing is avail at [facility]. Reviewed med list with Karena Addison, pt still has amlodipine and isosorbide at home, but only 15 pills of each left.  RN advising for patient to take those since BP is currently 187/91 and she has not had her meds in 2 days. Will send refill request HP to office to refill. RN to follow-up with patient and Ms.Karena Addison to ensure BP is down trending.    Reason for Disposition  [1] Systolic BP  >= 180 OR Diastolic >= 110 AND [2] missed most recent dose of blood pressure medication  Answer Assessment - Initial Assessment Questions 1. BLOOD PRESSURE: "What is the blood pressure?" "Did you take at least two measurements 5 minutes apart?"     187/91  2. ONSET: "When did you take your blood pressure?"     On call with nurse  3. HOW: "How did you take your blood pressure?" (e.g., automatic home BP monitor, visiting nurse)     Home cuff  4. HISTORY: "Do you have a history of high blood pressure?"     Yes  5. MEDICINES: "Are you taking any medicines for blood pressure?" "Have you missed any doses recently?"     Isosorbide, Losartan, Amlodipine  6. OTHER  SYMPTOMS: "Do you have any symptoms?" (e.g., blurred vision, chest pain, difficulty breathing, headache, weakness)     No pain  7. PREGNANCY: "Is there any chance you are pregnant?" "When was your last menstrual period?"     no  Protocols used: Blood Pressure - High-A-AH

## 2023-08-19 NOTE — Telephone Encounter (Signed)
  Chief Complaint: Urgent Home f/u  Additional Notes: Call made to patient to f/u on previous encounter. Per son, pts BP is improving. Now 177/93 HR 68.  RN advised that RX have been sent to pharmacy.

## 2023-08-19 NOTE — Telephone Encounter (Signed)
 Noted.

## 2023-08-19 NOTE — Telephone Encounter (Signed)
 Rx sent to pharmacy

## 2023-08-20 ENCOUNTER — Telehealth (HOSPITAL_BASED_OUTPATIENT_CLINIC_OR_DEPARTMENT_OTHER): Payer: Self-pay

## 2023-08-20 DIAGNOSIS — R42 Dizziness and giddiness: Secondary | ICD-10-CM

## 2023-08-20 DIAGNOSIS — I10 Essential (primary) hypertension: Secondary | ICD-10-CM

## 2023-08-20 LAB — BASIC METABOLIC PANEL
BUN/Creatinine Ratio: 17 (ref 12–28)
BUN: 42 mg/dL — ABNORMAL HIGH (ref 10–36)
CO2: 22 mmol/L (ref 20–29)
Calcium: 9.4 mg/dL (ref 8.7–10.3)
Chloride: 107 mmol/L — ABNORMAL HIGH (ref 96–106)
Creatinine, Ser: 2.42 mg/dL — ABNORMAL HIGH (ref 0.57–1.00)
Glucose: 85 mg/dL (ref 70–99)
Potassium: 4.7 mmol/L (ref 3.5–5.2)
Sodium: 145 mmol/L — ABNORMAL HIGH (ref 134–144)
eGFR: 17 mL/min/{1.73_m2} — ABNORMAL LOW (ref >59)

## 2023-08-20 LAB — THYROID PANEL WITH TSH
Free Thyroxine Index: 1.2 (ref 1.2–4.9)
T3 Uptake Ratio: 22 % — ABNORMAL LOW (ref 24–39)
T4, Total: 5.3 ug/dL (ref 4.5–12.0)
TSH: 15.5 u[IU]/mL — ABNORMAL HIGH (ref 0.450–4.500)

## 2023-08-20 LAB — ALDOSTERONE + RENIN ACTIVITY W/ RATIO
Aldos/Renin Ratio: 3.3 (ref 0.0–30.0)
Aldosterone: 1.7 ng/dL (ref 0.0–30.0)
Renin Activity, Plasma: 0.514 ng/mL/h (ref 0.167–5.380)

## 2023-08-20 LAB — MAGNESIUM: Magnesium: 2.4 mg/dL — ABNORMAL HIGH (ref 1.6–2.3)

## 2023-08-20 MED ORDER — ISOSORBIDE MONONITRATE ER 30 MG PO TB24: 60.0000 mg | ORAL_TABLET | Freq: Every day | ORAL | 11 refills | Status: DC

## 2023-08-20 NOTE — Telephone Encounter (Addendum)
 Results called to patient who verbalizes understanding! Rx to pharmacy.    ----- Message from Alver Sorrow sent at 08/20/2023  4:32 PM EDT ----- Stable kidney function from previous. Magnesium mildly elevated, not of concern. Labs not consistent with hyperaldosteronism.   Thyroid hormone remains elevated, T4 normal, T3 mildly decreased. Will route to PCP to inquire if any changes needed from thyroid perspective.   Phone note from PCP yesterday with elevated BP, increase Imdur to 60mg  daily.

## 2023-08-21 ENCOUNTER — Telehealth: Payer: Self-pay

## 2023-08-21 NOTE — Telephone Encounter (Signed)
 Left patient a detailed voice message to return call to office for scheduling follow up visit with PCP.

## 2023-08-21 NOTE — Telephone Encounter (Signed)
-----   Message from Garnette Gunner sent at 08/20/2023  4:52 PM EDT ----- Please have patient make an office visit to discuss management. ----- Message ----- From: Alver Sorrow, NP Sent: 08/20/2023   4:34 PM EDT To: Garnette Gunner, MD  FYI on abnormal TSH. T4 mildly abnormal but T3 normal, had checked as was abnormal 11/2022 and wanted to reassess given resistant hypertension. ----- Message ----- From: Interface, [facility] Lab Results In Sent: 08/20/2023   3:36 PM EDT To: Alver Sorrow, NP

## 2023-08-26 ENCOUNTER — Ambulatory Visit: Payer: Self-pay | Admitting: Family Medicine

## 2023-08-26 NOTE — Telephone Encounter (Signed)
 Copied from CRM [id number]. Topic: Clinical - Red Word Triage >> Aug 26, 2023  2:40 PM Drema Balzarine wrote: Red Word that prompted transfer to Nurse Triage: Patient son called says the medication isosorbide mononitrate (IMDUR) 30 MG is making patient dizzy. Chief Complaint: dizzy Symptoms: dizzy after taking IMDUR - makes patient feel unsteady on feet Frequency: around 08/20/2023 Pertinent Negatives: Patient denies chest pain Disposition: [] ED /[] Urgent Care (no appt availability in office) / [] Appointment(In office/virtual)/ []  [facility]/ [] Home Care/ [] Refused Recommended Disposition /[] [facility]/ [x]  Follow-up with PCP Additional Notes: started taking IMDUR approx 08/20/2023: was prescribed this medication in the past and it caused dizziness.  Pt stated taking the medication after being prescribed in the ER and pPt states every time she takes the pills she gets dizzy and feel like she is going to fall.  Pt will not take anymore of the IMDUR until she hears back from PCP due to medication making her fill unsafe.  Pt would like PCP to determine if a different medication can/will be prescribed  Reason for Disposition  Dizziness caused by not drinking enough fluids  Answer Assessment - Initial Assessment Questions 1. DESCRIPTION: "Describe your dizziness."     Dizzy makes you feel like goona fall 2. LIGHTHEADED: "Do you feel lightheaded?" (e.g., somewhat faint, woozy, weak upon standing)     weak 3. VERTIGO: "Do you feel like either you or the room is spinning or tilting?" (i.e. vertigo)     no 4. SEVERITY: "How bad is it?"  "Do you feel like you are going to faint?" "Can you stand and walk?"   - MILD: Feels slightly dizzy, but walking normally.   - MODERATE: Feels unsteady when walking, but not falling; interferes with normal activities (e.g., school, work).   - SEVERE: Unable to walk without falling, or requires assistance to walk without falling; feels like passing out  now.      Sometimes feels likes gonna fall right after taking the pill 5. ONSET:  "When did the dizziness begin?"     08/20/23 6. AGGRAVATING FACTORS: "Does anything make it worse?" (e.g., standing, change in head position)     no 7. HEART RATE: "Can you tell me your heart rate?" "How many beats in 15 seconds?"  (Note: not all patients can do this)       no 8. CAUSE: "What do you think is causing the dizziness?"     medication 9. RECURRENT SYMPTOM: "Have you had dizziness before?" If Yes, ask: "When was the last time?" "What happened that time?"    Yes - last time patient was prescribed IMDUR felt the same way 10. OTHER SYMPTOMS: "Do you have any other symptoms?" (e.g., fever, chest pain, vomiting, diarrhea, bleeding)       no 11. PREGNANCY: "Is there any chance you are pregnant?" "When was your last menstrual period?"       N/a  Protocols used: Dizziness - Lightheadedness-A-AH

## 2023-08-26 NOTE — Telephone Encounter (Signed)
 Pt needs a TOC call and schedule a HOSP FU appt to discuss medication concerns.

## 2023-08-26 NOTE — Telephone Encounter (Signed)
 Please advise, see below.   started taking IMDUR approx 08/20/2023: was prescribed this medication in the past and it caused dizziness. Pt stated taking the medication after being prescribed in the ER and pPt states every time she takes the pills she gets dizzy and feel like she is going to fall. Pt will not take anymore of the IMDUR until she hears back from PCP due to medication making her fill unsafe. Pt would like PCP to determine if a different medication can/will be prescribed

## 2023-08-26 NOTE — Transitions of Care (Post Inpatient/ED Visit) (Unsigned)
   08/26/2023  Name: HENRETTER PIEKARSKI MRN: [id number] DOB: 01/28/1921  Today's TOC FU Call Status: Today's TOC FU Call Status:: Successful TOC FU Call Completed Patient's Name and Date of Birth confirmed.  Transition Care Management Follow-up Telephone Call Date of Discharge: 08/18/23 Discharge Facility: Other (Non[facility]) Name of Other ([facility]) Discharge Facility: [facility] Type of Discharge: Emergency Department How have you been since you were released from the hospital?: Worse Any questions or concerns?: Yes Patient Questions/Concerns:: Imdur medication Patient Questions/Concerns Addressed: Notified Provider of Patient Questions/Concerns  Items Reviewed: Did you receive and understand the discharge instructions provided?: Yes Dietary orders reviewed?: NA Do you have support at home?: Yes People in Home: child(ren), adult Name of Support/Comfort Primary Source: [place]  Medications Reviewed Today: Medications Reviewed Today   Medications were not reviewed in this encounter     Home Care and Equipment/Supplies:    Functional Questionnaire:    Follow up appointments reviewed: PCP Follow-up appointment confirmed?: Yes Date of PCP follow-up appointment?: 08/28/23 Follow-up Provider: Fanny Bien Do you understand care options if your condition(s) worsen?: Yes-patient verbalized understanding

## 2023-08-26 NOTE — Telephone Encounter (Signed)
 Pt scheduled with Dr. [name] on 08/28/2023 for hosp f/u.

## 2023-08-28 ENCOUNTER — Ambulatory Visit (INDEPENDENT_AMBULATORY_CARE_PROVIDER_SITE_OTHER): Admitting: Family Medicine

## 2023-08-28 ENCOUNTER — Encounter: Payer: Self-pay | Admitting: Family Medicine

## 2023-08-28 VITALS — BP 172/94 | HR 88 | Temp 97.6°F | Wt 138.0 lb

## 2023-08-28 DIAGNOSIS — M6284 Sarcopenia: Secondary | ICD-10-CM | POA: Diagnosis not present

## 2023-08-28 DIAGNOSIS — R42 Dizziness and giddiness: Secondary | ICD-10-CM

## 2023-08-28 DIAGNOSIS — H42 Glaucoma in diseases classified elsewhere: Secondary | ICD-10-CM | POA: Diagnosis not present

## 2023-08-28 DIAGNOSIS — Z9181 History of falling: Secondary | ICD-10-CM | POA: Diagnosis not present

## 2023-08-28 DIAGNOSIS — I1A0 Resistant hypertension: Secondary | ICD-10-CM

## 2023-08-28 DIAGNOSIS — Z7409 Other reduced mobility: Secondary | ICD-10-CM | POA: Diagnosis not present

## 2023-08-28 DIAGNOSIS — N184 Chronic kidney disease, stage 4 (severe): Secondary | ICD-10-CM | POA: Diagnosis not present

## 2023-08-28 DIAGNOSIS — R54 Age-related physical debility: Secondary | ICD-10-CM

## 2023-08-28 MED ORDER — MECLIZINE HCL 12.5 MG PO TABS: 12.5000 mg | ORAL_TABLET | Freq: Three times a day (TID) | ORAL | 0 refills | Status: DC | PRN

## 2023-08-28 MED ORDER — CARVEDILOL 3.125 MG PO TABS: 3.1250 mg | ORAL_TABLET | Freq: Two times a day (BID) | ORAL | 3 refills | Status: DC

## 2023-08-28 NOTE — Progress Notes (Signed)
 Assessment/Plan:       Resistant Hypertension with Chronic Kidney Disease Stage 4 Ava presents with resistant hypertension, currently on amlodipine and losartan, with a blood pressure of 178/100 mmHg. Due to CKD stage 4, increasing losartan or using diuretics is not advisable. Previous trials of hydralazine and isosorbide mononitrate were not tolerated due to headaches, constipation, and dizziness. Carvedilol, a beta-blocker, is chosen for its renal-sparing properties and potential to reduce blood pressure without exacerbating dizziness. - Initiate carvedilol 3.125 mg twice daily - Discontinue isosorbide mononitrate  Dizziness Ava reports persistent dizziness, particularly when moving from sitting to standing, suggesting possible orthostatic hypotension. The dizziness may also have a vertiginous component. Previous medications contributed to dizziness, and she has experienced falls. Meclizine is considered to address potential vertiginous dizziness. Strategies to mitigate dizziness include slow positional changes and compression stockings. - Prescribe meclizine 12.5 mg three times a day as needed - Advise Ava to rise slowly from sitting to standing - Recommend wearing compression stockings to manage orthostatic hypotension - Initiate home health physical therapy to address lower extremity weakness and fall risk  Follow-up Ava requires close monitoring of her blood pressure and dizziness symptoms. Coordination with the blood pressure pharmacy team and specialists is necessary to optimize her management. Further evaluation for other causes of dizziness may be needed if symptoms persist. - Schedule follow-up appointment in two weeks for blood pressure check - Instruct Ava to go to the emergency department if she experiences severe symptoms such as headache, dizziness, or blurred vision         Medications Discontinued During This Encounter  Medication Reason   isosorbide mononitrate  (IMDUR) 30 MG 24 hr tablet     Return in about 2 weeks (around 09/11/2023) for BP.    Subjective:   Encounter date: 08/28/2023  Stacy Huang is a 89 y.o. female who has Other fatigue; Stage 3b chronic kidney disease (HCC); History of deep vein thrombosis (DVT) of lower extremity; Hypothyroidism; Hyperlipidemia; Compression fracture of L1 vertebra (HCC); Dizziness and giddiness; Hearing loss of both ears due to cerumen impaction; Resistant hypertension; Orthostatic dizziness; Vertigo; Frail elderly; Difficulty transferring from wheelchair to chair; Glaucoma of right eye; Nocturnal leg cramps; Stage 4 chronic kidney disease (HCC); Chronic nonintractable headache; Postmenopausal estrogen deficiency; Chronic pain of right knee; Drug-induced constipation; Sarcopenia; and Personal history of fall on their problem list..   She  has a past medical history of Acute hypoxemic respiratory failure due to COVID-19 [facility]) (02/27/2020), Essential hypertension (04/21/2021), Hypertension, Hypertensive urgency (02/27/2020), Shoulder fracture, right, and Thyroid disease..   She presents with chief complaint of ER follow up (Discuss alternative for imdur. Causes dizziness and vision blurriness) .  Discussed the use of AI scribe software for clinical note transcription with the patient, who gave verbal consent to proceed.  History of Present Illness   Stacy Huang is a 89 year old female with resistant hypertension and chronic kidney disease who presents with dizziness. She is accompanied by her son and caregiver.  She experiences persistent dizziness, which worsens with transitions from sitting to standing or when walking. The dizziness began after starting a new blood pressure medication, Imdur, which she has since discontinued, yet the dizziness persists. No dizziness occurs when turning her head or while sitting or lying down. She has a history of falls but none recently. No chest pain, shortness of breath,  confusion, or difficulty speaking or swallowing.  She has a history of resistant hypertension and chronic kidney disease.  Previous medications include hydralazine, which caused headaches and constipation, and Imdur, which resulted in significant dizziness. She is currently on amlodipine and losartan, but adjustments are limited due to her kidney disease. She has not tolerated nitrates or hydralazine in the past.  She reports weakness in her legs and is predominantly wheelchair-bound, impacting her mobility. She has used compression stockings in the past to help with dizziness related to orthostatic hypotension.     Review of Systems  Neurological:  Negative for speech change.    No past surgical history on file.  Outpatient Medications Prior to Visit  Medication Sig Dispense Refill   amLODipine (NORVASC) 10 MG tablet TAKE 1 TABLET(10 MG) BY MOUTH EVERY MORNING 90 tablet 1   diclofenac Sodium (VOLTAREN) 1 % GEL Apply 4 g topically 4 (four) times daily as needed. 100 g 3   losartan (COZAAR) 50 MG tablet Take 1 tablet (50 mg total) by mouth daily. 90 tablet 0   polyethylene glycol powder (GLYCOLAX/MIRALAX) 17 GM/SCOOP powder Take 17 g by mouth daily. 500 g 0   isosorbide mononitrate (IMDUR) 30 MG 24 hr tablet Take 2 tablets (60 mg total) by mouth daily. (Patient not taking: Reported on 08/28/2023) 30 tablet 11   No facility-administered medications prior to visit.    Family History  Problem Relation Age of Onset   Alzheimer's disease Mother    Hypertension Father    Other Sister        Died at birth   Cancer Brother        spinal   Heart attack Brother     Social History   Socioeconomic History   Marital status: Widowed    Spouse name: Not on file   Number of children: 1   Years of education: Not on file   Highest education level: Not on file  Occupational History   Not on file  Tobacco Use   Smoking status: Never    Passive exposure: Never   Smokeless tobacco: Never   Vaping Use   Vaping status: Never Used  Substance and Sexual Activity   Alcohol use: Never   Drug use: Never   Sexual activity: Not on file  Other Topics Concern   Not on file  Social History Narrative   Not on file   Social Drivers of Health   Financial Resource Strain: Low Risk  (10/19/2022)   Overall Financial Resource Strain (CARDIA)    Difficulty of Paying Living Expenses: Not hard at all  Food Insecurity: No Food Insecurity (10/19/2022)   Hunger Vital Sign    Worried About Running Out of Food in the Last Year: Never true    Ran Out of Food in the Last Year: Never true  Transportation Needs: No Transportation Needs (10/19/2022)   PRAPARE - Administrator, Civil Service (Medical): No    Lack of Transportation (Non-Medical): No  Physical Activity: Inactive (10/19/2022)   Exercise Vital Sign    Days of Exercise per Week: 0 days    Minutes of Exercise per Session: 0 min  Stress: No Stress Concern Present (10/19/2022)   [employer] of Occupational Health - Occupational Stress Questionnaire    Feeling of Stress : Not at all  Social Connections: Moderately Isolated (08/07/2022)   Social Connection and Isolation Panel [NHANES]    Frequency of Communication with Friends and Family: Twice a week    Frequency of Social Gatherings with Friends and Family: Once a week    Attends Religious Services:  1 to 4 times per year    Active Member of Clubs or Organizations: No    Attends Banker Meetings: Never    Marital Status: Widowed  Intimate Partner Violence: Not At Risk (08/07/2022)   Humiliation, Afraid, Rape, and Kick questionnaire    Fear of Current or Ex-Partner: No    Emotionally Abused: No    Physically Abused: No    Sexually Abused: No                                                                                                  Objective:  Physical Exam: BP (!) 172/94   Pulse 88   Temp 97.6 F (36.4 C) (Temporal)   Wt 138 lb (62.6 kg)    SpO2 95%   BMI 24.45 kg/m     Physical Exam Constitutional:      General: She is not in acute distress.    Appearance: She is not ill-appearing or toxic-appearing.     Comments: Patient is wheelchair-bound. Frail.   HENT:     Head: Normocephalic and atraumatic.     Nose: Nose normal. No congestion.  Eyes:     General: No scleral icterus.    Extraocular Movements: Extraocular movements intact.  Cardiovascular:     Rate and Rhythm: Normal rate and regular rhythm.     Pulses: Normal pulses.     Heart sounds: Normal heart sounds.  Pulmonary:     Effort: Pulmonary effort is normal. No respiratory distress.     Breath sounds: Normal breath sounds.  Abdominal:     General: Abdomen is flat. Bowel sounds are normal.     Palpations: Abdomen is soft.  Musculoskeletal:        General: Normal range of motion.     Comments: Muscle loss in lower extremities due to age  Lymphadenopathy:     Cervical: No cervical adenopathy.  Skin:    General: Skin is warm and dry.     Findings: No rash.  Neurological:     General: No focal deficit present.     Mental Status: She is alert and oriented to person, place, and time. Mental status is at baseline.     Motor: Weakness (lower extremities) present.  Psychiatric:        Mood and Affect: Mood normal.        Behavior: Behavior normal.        Thought Content: Thought content normal.        Judgment: Judgment normal.     DG Chest Port 1 View Result Date: 06/18/2023 CLINICAL DATA:  Hypertension EXAM: PORTABLE CHEST - 1 VIEW COMPARISON:  04/23/2021 FINDINGS: Mild bibasilar interstitial opacities, right worse than left. No confluent airspace disease. Heart size upper limits normal for technique. Aortic Atherosclerosis (ICD10-170.0). No effusion. Visualized bones unremarkable. IMPRESSION: Mild bibasilar interstitial opacities. Electronically Signed   By: Corlis Leak M.D.   On: 06/18/2023 14:21    Recent Results (from the past 2160 hours)  Basic  metabolic panel     Status: Abnormal   Collection Time: 06/18/23 12:54 PM  Result Value Ref Range   Sodium 137 135 - 145 mmol/L   Potassium 4.5 3.5 - 5.1 mmol/L   Chloride 105 98 - 111 mmol/L   CO2 27 22 - 32 mmol/L   Glucose, Bld 97 70 - 99 mg/dL    Comment: Glucose reference range applies only to samples taken after fasting for at least 8 hours.   BUN 46 (H) 8 - 23 mg/dL   Creatinine, Ser 0.45 (H) 0.44 - 1.00 mg/dL   Calcium 8.8 (L) 8.9 - 10.3 mg/dL   GFR, Estimated 17 (L) >60 mL/min    Comment: (NOTE) Calculated using the CKD-EPI Creatinine Equation (2021)    Anion gap 5 5 - 15    Comment: Performed at [facility], [address] [place], [place] [address]  CBC     Status: Abnormal   Collection Time: 06/18/23 12:54 PM  Result Value Ref Range   WBC 8.3 4.0 - 10.5 K/uL   RBC 3.72 (L) 3.87 - 5.11 MIL/uL   Hemoglobin 11.2 (L) 12.0 - 15.0 g/dL   HCT 19.1 (L) 47.8 - 29.5 %   MCV 93.5 80.0 - 100.0 fL   MCH 30.1 26.0 - 34.0 pg   MCHC 32.2 30.0 - 36.0 g/dL   RDW 62.1 30.8 - 65.7 %   Platelets 240 150 - 400 K/uL   nRBC 0.0 0.0 - 0.2 %    Comment: Performed at [facility], [address] [place], [place] [address]  Troponin I (High Sensitivity)     Status: None   Collection Time: 06/18/23 12:54 PM  Result Value Ref Range   Troponin I (High Sensitivity) 16 <18 ng/L    Comment: (NOTE) Elevated high sensitivity troponin I (hsTnI) values and significant  changes across serial measurements may suggest ACS but many other  chronic and acute conditions are known to elevate hsTnI results.  Refer to the "Links" section for chest pain algorithms and additional  guidance. Performed at [facility], [address] [place], [place] [address]   Troponin I (High Sensitivity)     Status: None   Collection Time: 06/18/23  3:08 PM  Result Value Ref Range   Troponin I (High Sensitivity) 17 <18 ng/L    Comment: (NOTE) Elevated high sensitivity troponin I  (hsTnI) values and significant  changes across serial measurements may suggest ACS but many other  chronic and acute conditions are known to elevate hsTnI results.  Refer to the "Links" section for chest pain algorithms and additional  guidance. Performed at [facility] [address] [place], [place] [address]   Aldosterone + renin activity w/ ratio     Status: None   Collection Time: 08/15/23 10:28 AM  Result Value Ref Range   Aldosterone 1.7 0.0 - 30.0 ng/dL   Renin Activity, Plasma 0.514 0.167 - 5.380 ng/mL/hr   Aldos/Renin Ratio 3.3 0.0 - 30.0    Comment:                          Units:      ng/dL per ng/mL/hr  Thyroid Panel With TSH     Status: Abnormal   Collection Time: 08/15/23 10:28 AM  Result Value Ref Range   TSH 15.500 (H) 0.450 - 4.500 uIU/mL   T4, Total 5.3 4.5 - 12.0 ug/dL   T3 Uptake Ratio 22 (L) 24 - 39 %   Free  Thyroxine Index 1.2 1.2 - 4.9  Basic metabolic panel     Status: Abnormal   Collection Time: 08/15/23 10:28 AM  Result Value Ref Range   Glucose 85 70 - 99 mg/dL   BUN 42 (H) 10 - 36 mg/dL   Creatinine, Ser 4.13 (H) 0.57 - 1.00 mg/dL   eGFR 17 (L) >24 MW/NUU/7.25   BUN/Creatinine Ratio 17 12 - 28   Sodium 145 (H) 134 - 144 mmol/L   Potassium 4.7 3.5 - 5.2 mmol/L   Chloride 107 (H) 96 - 106 mmol/L   CO2 22 20 - 29 mmol/L   Calcium 9.4 8.7 - 10.3 mg/dL  Magnesium     Status: Abnormal   Collection Time: 08/15/23 10:28 AM  Result Value Ref Range   Magnesium 2.4 (H) 1.6 - 2.3 mg/dL        Garner Nash, MD, MS

## 2023-08-28 NOTE — Patient Instructions (Signed)
 VISIT SUMMARY:  Stacy Huang, a 89 year old female with resistant hypertension and chronic kidney disease, visited today due to persistent dizziness. The dizziness began after starting a new blood pressure medication, which has since been discontinued. She experiences dizziness mainly when moving from sitting to standing. She has a history of falls but none recently. Her current medications include amlodipine and losartan, and she has not tolerated other medications well in the past.  YOUR PLAN:  -RESISTANT HYPERTENSION WITH CHRONIC KIDNEY DISEASE STAGE 4: Resistant hypertension means that your high blood pressure is difficult to control with medications, and chronic kidney disease stage 4 indicates significant kidney damage. We are starting you on carvedilol 3.125 mg twice daily to help manage your blood pressure without worsening your dizziness. We are discontinuing isosorbide mononitrate.  -DIZZINESS: Your dizziness, especially when moving from sitting to standing, may be due to orthostatic hypotension, which is a drop in blood pressure when you stand up. We are prescribing meclizine 12.5 mg three times a day as needed to help with dizziness. Additionally, please rise slowly from sitting to standing and wear compression stockings to help manage this. We are also initiating home health physical therapy to address your leg weakness and reduce your risk of falls.  INSTRUCTIONS:  Please schedule a follow-up appointment in two weeks for a blood pressure check. If you experience severe symptoms such as headache, dizziness, or blurred vision, go to the emergency department immediately.

## 2023-09-05 ENCOUNTER — Ambulatory Visit (HOSPITAL_BASED_OUTPATIENT_CLINIC_OR_DEPARTMENT_OTHER)

## 2023-09-05 DIAGNOSIS — I10 Essential (primary) hypertension: Secondary | ICD-10-CM

## 2023-09-05 DIAGNOSIS — R42 Dizziness and giddiness: Secondary | ICD-10-CM | POA: Diagnosis not present

## 2023-09-06 ENCOUNTER — Telehealth (HOSPITAL_BASED_OUTPATIENT_CLINIC_OR_DEPARTMENT_OTHER): Payer: Self-pay

## 2023-09-06 NOTE — Telephone Encounter (Addendum)
 Results called to patient who verbalizes understanding!    ----- Message from Alver Sorrow sent at 09/06/2023  8:29 AM EDT ----- No significant carotid stenosis. Good result!

## 2023-09-11 ENCOUNTER — Ambulatory Visit: Admitting: Family Medicine

## 2023-09-17 ENCOUNTER — Encounter: Payer: Self-pay | Admitting: Cardiology

## 2023-09-17 ENCOUNTER — Ambulatory Visit: Payer: 59 | Attending: Cardiology | Admitting: Cardiology

## 2023-09-17 VITALS — BP 234/89 | HR 65 | Resp 16 | Ht 63.0 in | Wt 130.6 lb

## 2023-09-17 DIAGNOSIS — I10 Essential (primary) hypertension: Secondary | ICD-10-CM

## 2023-09-17 DIAGNOSIS — R42 Dizziness and giddiness: Secondary | ICD-10-CM | POA: Diagnosis not present

## 2023-09-17 DIAGNOSIS — R7989 Other specified abnormal findings of blood chemistry: Secondary | ICD-10-CM | POA: Diagnosis not present

## 2023-09-17 DIAGNOSIS — N184 Chronic kidney disease, stage 4 (severe): Secondary | ICD-10-CM

## 2023-09-17 MED ORDER — MECLIZINE HCL 12.5 MG PO TABS: 12.5000 mg | ORAL_TABLET | Freq: Three times a day (TID) | ORAL | 3 refills | Status: DC | PRN

## 2023-09-17 MED ORDER — AMLODIPINE BESYLATE 10 MG PO TABS: ORAL_TABLET | ORAL | 2 refills | Status: DC

## 2023-09-17 MED ORDER — METOPROLOL SUCCINATE ER 25 MG PO TB24: 25.0000 mg | ORAL_TABLET | Freq: Every day | ORAL | 2 refills | Status: DC

## 2023-09-17 MED ORDER — LOSARTAN POTASSIUM 50 MG PO TABS
50.0000 mg | ORAL_TABLET | Freq: Every day | ORAL | 2 refills | Status: DC
Start: 2023-09-17 — End: 2024-02-14

## 2023-09-17 NOTE — Progress Notes (Signed)
 Cardiology Office Note:  .   Date:  09/17/2023  ID:  Stacy Huang, DOB December 02, 1920, MRN [id number] PCP: Garnette Gunner, MD  [facility] Providers Cardiologist:  Yates Decamp, MD Cardiology APP:  Alver Sorrow, NP   History of Present Illness: .   Stacy Huang is a 89 y.o. African-American female patient with hypertension, hyperlipidemia, chronic stage IIIb kidney disease, hypothyroidism presents for management of hypertension.  Her last echocardiogram on 09/25/2022 it revealed normal LVEF without significant valvular abnormality.  She presented with dizziness last month and underwent carotid artery duplex.    Discussed the use of AI scribe software for clinical note transcription with the patient, who gave verbal consent to proceed.  Stacy Huang, an elderly female patient, presents with confusion regarding her medication regimen due to frequent changes and different prescriptions from various healthcare providers. She reports a recent urinary infection for which she was prescribed antibiotics. She also mentions experiencing weakness in her leg, but does not provide further details about this symptom. Stacy Huang's son, who usually accompanies her to appointments, is currently dealing with a sciatic nerve issue, causing him to drag his leg. [name]'s friend, [name], expresses concern about Stacy Huang's medication management and her son's health.  Labs   Lab Results  Component Value Date   TRIG 146 02/26/2020   Lab Results  Component Value Date   NA 145 (H) 08/15/2023   K 4.7 08/15/2023   CO2 22 08/15/2023   GLUCOSE 85 08/15/2023   BUN 42 (H) 08/15/2023   CREATININE 2.42 (H) 08/15/2023   CALCIUM 9.4 08/15/2023   GFR 18.33 (L) 11/21/2022   EGFR 17 (L) 08/15/2023   GFRNONAA 17 (L) 06/18/2023      Latest Ref Rng & Units 08/15/2023   10:28 AM 06/18/2023   12:54 PM 11/21/2022   11:32 AM  BMP  Glucose 70 - 99 mg/dL 85  97  90   BUN 10 - 36 mg/dL 42  46  39   Creatinine 0.57 - 1.00 mg/dL 9.70  2.63   7.85   BUN/Creat Ratio 12 - 28 17     Sodium 134 - 144 mmol/L 145  137  140   Potassium 3.5 - 5.2 mmol/L 4.7  4.5  4.9   Chloride 96 - 106 mmol/L 107  105  107   CO2 20 - 29 mmol/L 22  27  24    Calcium 8.7 - 10.3 mg/dL 9.4  8.8  9.5       Latest Ref Rng & Units 06/18/2023   12:54 PM 11/21/2022   11:32 AM 04/25/2021    4:52 AM  CBC  WBC 4.0 - 10.5 K/uL 8.3  8.2  11.6   Hemoglobin 12.0 - 15.0 g/dL 88.5  02.7  8.9   Hematocrit 36.0 - 46.0 % 34.8  36.1  26.8   Platelets 150 - 400 K/uL 240  281.0  312    No results found for: "HGBA1C"  Lab Results  Component Value Date   TSH 15.500 (H) 08/15/2023    Review of Systems  Cardiovascular:  Negative for chest pain, dyspnea on exertion and leg swelling.   Physical Exam:   VS:  BP (!) 234/89 (BP Location: Left Arm, Patient Position: Sitting, Cuff Size: Normal)   Pulse 65   Resp 16   Ht 5\' 3"  (1.6 m)   Wt 130 lb 9.6 oz (59.2 kg)   SpO2 94%   BMI 23.13 kg/m    Wt Readings from  Last 3 Encounters:  09/17/23 130 lb 9.6 oz (59.2 kg)  08/28/23 138 lb (62.6 kg)  08/15/23 138 lb 9.6 oz (62.9 kg)    Physical Exam Neck:     Vascular: No carotid bruit or JVD.  Cardiovascular:     Rate and Rhythm: Normal rate and regular rhythm.     Pulses: Intact distal pulses.     Heart sounds: Normal heart sounds. No murmur heard.    No gallop.  Pulmonary:     Effort: Pulmonary effort is normal.     Breath sounds: Normal breath sounds.  Abdominal:     General: Bowel sounds are normal.     Palpations: Abdomen is soft.  Musculoskeletal:     Right lower leg: No edema.     Left lower leg: No edema.    Studies Reviewed: .    Carotid artery duplex 09/05/2023: Bilateral ICA demonstrate minimal plaque with antegrade vertebral artery flow.  Normal flow hemodynamics in bilateral subclavian arteries. EKG:   EKG 06/18/2023: Normal sinus rhythm at rate of 66 bpm, poor R wave progression, cannot exclude septal infarct old.  Probably normal.  Medications and  allergies    Allergies  Allergen Reactions   Hydralazine Nausea Only    Constipation, Headache, and Dizziness   Lactose Nausea Only and Other (See Comments)    GI Upset- Upsets the stomach   Lactose Intolerance (Gi) Other (See Comments)    Upsets the stomach     Current Outpatient Medications:    metoprolol succinate (TOPROL XL) 25 MG 24 hr tablet, Take 1 tablet (25 mg total) by mouth daily., Disp: 90 tablet, Rfl: 2   amLODipine (NORVASC) 10 MG tablet, TAKE 1 TABLET(10 MG) BY MOUTH EVERY MORNING, Disp: 90 tablet, Rfl: 2   diclofenac Sodium (VOLTAREN) 1 % GEL, Apply 4 g topically 4 (four) times daily as needed., Disp: 100 g, Rfl: 3   losartan (COZAAR) 50 MG tablet, Take 1 tablet (50 mg total) by mouth daily., Disp: 90 tablet, Rfl: 2   meclizine (ANTIVERT) 12.5 MG tablet, Take 1 tablet (12.5 mg total) by mouth 3 (three) times daily as needed for dizziness., Disp: 90 tablet, Rfl: 3   polyethylene glycol powder (GLYCOLAX/MIRALAX) 17 GM/SCOOP powder, Take 17 g by mouth daily., Disp: 500 g, Rfl: 0   Meds ordered this encounter  Medications   metoprolol succinate (TOPROL XL) 25 MG 24 hr tablet    Sig: Take 1 tablet (25 mg total) by mouth daily.    Dispense:  90 tablet    Refill:  2    Patient to stop Carvedilol   meclizine (ANTIVERT) 12.5 MG tablet    Sig: Take 1 tablet (12.5 mg total) by mouth 3 (three) times daily as needed for dizziness.    Dispense:  90 tablet    Refill:  3   losartan (COZAAR) 50 MG tablet    Sig: Take 1 tablet (50 mg total) by mouth daily.    Dispense:  90 tablet    Refill:  2   amLODipine (NORVASC) 10 MG tablet    Sig: TAKE 1 TABLET(10 MG) BY MOUTH EVERY MORNING    Dispense:  90 tablet    Refill:  2     Medications Discontinued During This Encounter  Medication Reason   carvedilol (COREG) 3.125 MG tablet    losartan (COZAAR) 50 MG tablet Reorder   amLODipine (NORVASC) 10 MG tablet Reorder   meclizine (ANTIVERT) 12.5 MG tablet Reorder  ASSESSMENT  AND PLAN: .      ICD-10-CM   1. Primary hypertension  I10 metoprolol succinate (TOPROL XL) 25 MG 24 hr tablet    losartan (COZAAR) 50 MG tablet    amLODipine (NORVASC) 10 MG tablet    2. Stage 4 chronic kidney disease (HCC)  N18.4     3. Abnormal TSH  R79.89     4. Vertigo  R42 meclizine (ANTIVERT) 12.5 MG tablet     1. Primary hypertension (Primary) Blood pressure is markedly elevated today.  Patient only brought carvedilol with her today.  There has been some confusion regarding medications. She is on three antihypertensive medications, and simplifying the regimen is necessary to improve adherence. Switch to metoprolol succinate 25 mg once daily to reduce pill burden and enhance compliance. Prescribe a 90-day supply of all three antihypertensive medications. Instruct to take all medications in the morning after breakfast. - metoprolol succinate (TOPROL XL) 25 MG 24 hr tablet; Take 1 tablet (25 mg total) by mouth daily.  Dispense: 90 tablet; Refill: 2 - losartan (COZAAR) 50 MG tablet; Take 1 tablet (50 mg total) by mouth daily.  Dispense: 90 tablet; Refill: 2 - amLODipine (NORVASC) 10 MG tablet; TAKE 1 TABLET(10 MG) BY MOUTH EVERY MORNING  Dispense: 90 tablet; Refill: 2  She is presently 89 years old, she has done well with no EKG abnormalities to suggest ischemia, cardiac examination is completely normal, would not be too aggressive with hypertension management that would lead to worsening dizziness and/or potential for cerebral ischemia in a patient that has had chronic hypertension.  I would like to see him back in 6 months for follow-up.  2. Stage 4 chronic kidney disease (HCC) Patient has a stage IV chronic kidney disease that has remained stable by recent BMP.  Continue losartan 50 mg daily.  No clinical evidence of heart failure or volume excess.  Potassium levels are also stable at 4.7.  3. Abnormal TSH Patient has markedly abnormal TSH, will send a note to her PCP  4.  Vertigo I refilled her meclizine. - meclizine (ANTIVERT) 12.5 MG tablet; Take 1 tablet (12.5 mg total) by mouth 3 (three) times daily as needed for dizziness.  Dispense: 90 tablet; Refill: 3   Signed,  Yates Decamp, MD, [facility] 09/17/2023, 10:10 AM [facility] [address] [place], [place] [address] Phone: [phone]. Fax:  [phone]

## 2023-09-17 NOTE — Patient Instructions (Signed)
 Medication Instructions:  Your physician has recommended you make the following change in your medication:  Stop Carvedilol Start Metoprolol Succinate 25 mg by mouth daily   *If you need a refill on your cardiac medications before your next appointment, please call your pharmacy*  Lab Work: none If you have labs (blood work) drawn today and your tests are completely normal, you will receive your results only by: MyChart Message (if you have MyChart) OR A paper copy in the mail If you have any lab test that is abnormal or we need to change your treatment, we will call you to review the results.  Testing/Procedures: none  Follow-Up: At [facility], you and your health needs are our priority.  As part of our continuing mission to provide you with exceptional heart care, our providers are all part of one team.  This team includes your primary Cardiologist (physician) and Advanced Practice Providers or APPs (Physician Assistants and Nurse Practitioners) who all work together to provide you with the care you need, when you need it.  Your next appointment:   6 month(s)  Provider:   Yates Decamp, MD     We recommend signing up for the patient portal called "MyChart".  Sign up information is provided on this After Visit Summary.  MyChart is used to connect with patients for Virtual Visits (Telemedicine).  Patients are able to view lab/test results, encounter notes, upcoming appointments, etc.  Non-urgent messages can be sent to your provider as well.   To learn more about what you can do with MyChart, go to [email or web address].   Other Instructions     1st Floor: - Lobby - Registration  - Pharmacy  - Lab - Cafe  2nd Floor: - PV Lab - Diagnostic Testing (echo, CT, nuclear med)  3rd Floor: - Vacant  4th Floor: - TCTS (cardiothoracic surgery) - AFib Clinic - Structural Heart Clinic - Vascular Surgery  - Vascular Ultrasound  5th Floor: - [facility]  (general and EP) - Clinical Pharmacy for coumadin, hypertension, lipid, weight-loss medications, and med management appointments    Valet parking services will be available as well.

## 2023-09-19 ENCOUNTER — Telehealth: Payer: Self-pay

## 2023-09-19 NOTE — Telephone Encounter (Signed)
 Pt scheduled at 11am on 09/24/2023

## 2023-09-19 NOTE — Telephone Encounter (Signed)
-----   Message from Garnette Gunner sent at 09/17/2023 10:49 AM EDT ----- Patient had abnormal thyroid test taken by her cardiologist. Please have patient follow up for an office visit to discuss treatment options.

## 2023-09-19 NOTE — Telephone Encounter (Signed)
-----   Message from Stacy Huang sent at 09/17/2023 10:49 AM EDT ----- Patient had abnormal thyroid test taken by her cardiologist. Please have patient follow up for an office visit to discuss treatment options.

## 2023-09-23 ENCOUNTER — Encounter: Payer: Self-pay | Admitting: Family Medicine

## 2023-09-24 ENCOUNTER — Encounter: Payer: Self-pay | Admitting: Family Medicine

## 2023-09-24 ENCOUNTER — Ambulatory Visit (INDEPENDENT_AMBULATORY_CARE_PROVIDER_SITE_OTHER): Admitting: Family Medicine

## 2023-09-24 VITALS — BP 138/84 | HR 77 | Temp 97.7°F

## 2023-09-24 DIAGNOSIS — I1A0 Resistant hypertension: Secondary | ICD-10-CM

## 2023-09-24 DIAGNOSIS — N184 Chronic kidney disease, stage 4 (severe): Secondary | ICD-10-CM | POA: Diagnosis not present

## 2023-09-24 DIAGNOSIS — E039 Hypothyroidism, unspecified: Secondary | ICD-10-CM | POA: Diagnosis not present

## 2023-09-24 MED ORDER — LEVOTHYROXINE SODIUM 25 MCG PO TABS: ORAL_TABLET | ORAL | 3 refills | Status: DC

## 2023-09-24 NOTE — Progress Notes (Unsigned)
 Assessment/Plan:   Problem List Items Addressed This Visit   None   Assessment and Plan Assessment & Plan Hypothyroidism Stacy Huang, a 89 year old female, has hypothyroidism with an elevated TSH level of 15.5, while T4 and free thyroid levels are within normal range. The low uptake ratio indicates decreased thyroid function, likely due to age-related decline. Thyroid hormone is crucial for energy, cardiovascular, renal, and neurological health. Excessive replacement can cause tachycardia and diaphoresis. Levothyroxine is recommended for management. - Start levothyroxine 12.5 micrograms daily. - Instruct to take levothyroxine in the morning, one hour before eating or taking other medications. - Recheck thyroid hormone levels in six weeks.  Resistant Hypertension Stacy Huang has resistant hypertension, noted in the context of her overall health but not the primary focus of this visit.  Chronic Kidney Disease Stage 4 Stacy Huang has chronic kidney disease stage 4, noted in the context of her overall health but not the primary focus of this visit.  Follow-up A follow-up plan is in place to monitor levothyroxine treatment efficacy and adjust dosage if necessary. - Schedule follow-up appointment in six weeks for lab visit to recheck thyroid hormone levels.      There are no discontinued medications.  No follow-ups on file.    Subjective:   Encounter date: 09/24/2023  Stacy Huang is a 89 y.o. female who has Other fatigue; Stage 3b chronic kidney disease (HCC); History of deep vein thrombosis (DVT) of lower extremity; Hypothyroidism; Hyperlipidemia; Compression fracture of L1 vertebra (HCC); Dizziness and giddiness; Hearing loss of both ears due to cerumen impaction; Resistant hypertension; Orthostatic dizziness; Vertigo; Frail elderly; Difficulty transferring from wheelchair to chair; Glaucoma of right eye; Nocturnal leg cramps; Stage 4 chronic kidney disease (HCC); Chronic nonintractable headache;  Postmenopausal estrogen deficiency; Chronic pain of right knee; Drug-induced constipation; Sarcopenia; and Personal history of fall on their problem list..   She  has a past medical history of Acute hypoxemic respiratory failure due to COVID-19 [facility]) (02/27/2020), Essential hypertension (04/21/2021), Hypertension, Hypertensive urgency (02/27/2020), Shoulder fracture, right, and Thyroid disease..   She presents with chief complaint of Medical Management of Chronic Issues (Follow up on abnormal thyroid labs) .   Discussed the use of AI scribe software for clinical note transcription with the patient, who gave verbal consent to proceed.  History of Present Illness A 89 year old female with chronic kidney disease stage 4 and resistant hypertension presents for evaluation of hypothyroidism.  She was recently evaluated for hypothyroidism after her cardiologist checked her thyroid function. The evaluation revealed an elevated TSH level of 15.5, while T4 and free thyroid levels were within normal range. The uptake ratio was low, indicating decreased thyroid activity.  She has a history of chronic kidney disease stage 4 and resistant hypertension. There are no current symptoms related to hypothyroidism. She is not currently on any thyroid medication.     ROS  No past surgical history on file.  Outpatient Medications Prior to Visit  Medication Sig Dispense Refill  . amLODipine (NORVASC) 10 MG tablet TAKE 1 TABLET(10 MG) BY MOUTH EVERY MORNING 90 tablet 2  . diclofenac Sodium (VOLTAREN) 1 % GEL Apply 4 g topically 4 (four) times daily as needed. 100 g 3  . losartan (COZAAR) 50 MG tablet Take 1 tablet (50 mg total) by mouth daily. 90 tablet 2  . meclizine (ANTIVERT) 12.5 MG tablet Take 1 tablet (12.5 mg total) by mouth 3 (three) times daily as needed for dizziness. 90 tablet 3  . metoprolol succinate (TOPROL XL) 25  MG 24 hr tablet Take 1 tablet (25 mg total) by mouth daily. 90 tablet 2  .  polyethylene glycol powder (GLYCOLAX/MIRALAX) 17 GM/SCOOP powder Take 17 g by mouth daily. 500 g 0   No facility-administered medications prior to visit.    Family History  Problem Relation Age of Onset  . Alzheimer's disease Mother   . Hypertension Father   . Other Sister        Died at birth  . Cancer Brother        spinal  . Heart attack Brother     Social History   Socioeconomic History  . Marital status: Widowed    Spouse name: Not on file  . Number of children: 1  . Years of education: Not on file  . Highest education level: Not on file  Occupational History  . Not on file  Tobacco Use  . Smoking status: Never    Passive exposure: Never  . Smokeless tobacco: Never  Vaping Use  . Vaping status: Never Used  Substance and Sexual Activity  . Alcohol use: Never  . Drug use: Never  . Sexual activity: Not on file  Other Topics Concern  . Not on file  Social History Narrative  . Not on file   Social Drivers of Health   Financial Resource Strain: Low Risk  (10/19/2022)   Overall Financial Resource Strain (CARDIA)   . Difficulty of Paying Living Expenses: Not hard at all  Food Insecurity: No Food Insecurity (10/19/2022)   Hunger Vital Sign   . Worried About Programme researcher, broadcasting/film/video in the Last Year: Never true   . Ran Out of Food in the Last Year: Never true  Transportation Needs: No Transportation Needs (10/19/2022)   PRAPARE - Transportation   . Lack of Transportation (Medical): No   . Lack of Transportation (Non-Medical): No  Physical Activity: Inactive (10/19/2022)   Exercise Vital Sign   . Days of Exercise per Week: 0 days   . Minutes of Exercise per Session: 0 min  Stress: No Stress Concern Present (10/19/2022)   [employer] of Occupational Health - Occupational Stress Questionnaire   . Feeling of Stress : Not at all  Social Connections: Moderately Isolated (08/07/2022)   Social Connection and Isolation Panel [NHANES]   . Frequency of Communication with  Friends and Family: Twice a week   . Frequency of Social Gatherings with Friends and Family: Once a week   . Attends Religious Services: 1 to 4 times per year   . Active Member of Clubs or Organizations: No   . Attends Banker Meetings: Never   . Marital Status: Widowed  Intimate Partner Violence: Not At Risk (08/07/2022)   Humiliation, Afraid, Rape, and Kick questionnaire   . Fear of Current or Ex-Partner: No   . Emotionally Abused: No   . Physically Abused: No   . Sexually Abused: No                                                                                                  Objective:  Physical Exam:  BP (!) 144/86   Pulse 77   Temp 97.7 F (36.5 C) (Temporal)   SpO2 97%    Physical Exam    Physical Exam  VAS US CAROTID Result Date: 09/05/2023 Carotid Arterial Duplex Study Patient Name:  Mckenze Venita Sheffield  Date of Exam:   09/05/2023 Medical Rec #: [id number]        Accession #:    [id number] Date of Birth: [birth year]         Patient Gender: F Patient Age:   89 years Exam Location:  [facility] Procedure:      VAS US CAROTID Referring Phys: Gillian Shields --------------------------------------------------------------------------------  Indications:       Weakness and patient denies any cerebrovascular symptoms                    other than dizziness. Risk Factors:      Hypertension, hyperlipidemia, no history of smoking. Comparison Study:  NA Performing Technologist: Jeryl Columbia RDCS  Examination Guidelines: A complete evaluation includes B-mode imaging, spectral Doppler, color Doppler, and power Doppler as needed of all accessible portions of each vessel. Bilateral testing is considered an integral part of a complete examination. Limited examinations for reoccurring indications may be performed as noted.  Right Carotid Findings: +----------+--------+--------+--------+------------------+------------------+           PSV cm/sEDV cm/sStenosisPlaque  DescriptionComments           +----------+--------+--------+--------+------------------+------------------+ CCA Prox  86      13                                tortuous           +----------+--------+--------+--------+------------------+------------------+ CCA Mid   71      8                                                    +----------+--------+--------+--------+------------------+------------------+ CCA Distal55      17                                intimal thickening +----------+--------+--------+--------+------------------+------------------+ ICA Prox  71      12                                                   +----------+--------+--------+--------+------------------+------------------+ ICA Mid   90      18                                tortuous           +----------+--------+--------+--------+------------------+------------------+ ICA Distal99      19                                tortuous           +----------+--------+--------+--------+------------------+------------------+ ECA       59      9                                                    +----------+--------+--------+--------+------------------+------------------+ +----------+--------+-------+----------------+-------------------+  PSV cm/sEDV cmsDescribe        Arm Pressure (mmHG) +----------+--------+-------+----------------+-------------------+ [id number]      18     Multiphasic, WNL                    +----------+--------+-------+----------------+-------------------+ +---------+--------+--+--------+--+---------+ VertebralPSV cm/s41EDV cm/s11Antegrade +---------+--------+--+--------+--+---------+  Left Carotid Findings: +----------+--------+--------+--------+------------------+------------------+           PSV cm/sEDV cm/sStenosisPlaque DescriptionComments           +----------+--------+--------+--------+------------------+------------------+ CCA Prox  69       13                                                   +----------+--------+--------+--------+------------------+------------------+ CCA Mid   71      12                                                   +----------+--------+--------+--------+------------------+------------------+ CCA Distal60      12                                intimal thickening +----------+--------+--------+--------+------------------+------------------+ ICA Prox  39      11                                                   +----------+--------+--------+--------+------------------+------------------+ ICA Mid   81      19              smooth                               +----------+--------+--------+--------+------------------+------------------+ ICA Distal44      9                                                    +----------+--------+--------+--------+------------------+------------------+ ECA       91      6                                                    +----------+--------+--------+--------+------------------+------------------+ +----------+--------+--------+----------------+-------------------+           PSV cm/sEDV cm/sDescribe        Arm Pressure (mmHG) +----------+--------+--------+----------------+-------------------+ [id number]      8       Multiphasic, WNL                    +----------+--------+--------+----------------+-------------------+ +---------+--------+--+--------+--+---------+ VertebralPSV cm/s44EDV cm/s12Antegrade +---------+--------+--+--------+--+---------+   Summary: Right Carotid: The extracranial vessels were near-normal with only minimal wall                thickening or plaque. Left Carotid: The extracranial vessels were near-normal with only minimal wall  thickening or plaque. Vertebrals:  Bilateral vertebral arteries demonstrate antegrade flow. Subclavians: Normal flow hemodynamics were seen in bilateral subclavian               arteries. *See table(s) above for measurements and observations.  Electronically signed by Timothy Gollan MD on 09/05/2023 at 5:36:44 PM.    Final     Recent Results (from the past 2160 hours)  Aldosterone + renin activity w/ ratio     Status: None   Collection Time: 08/15/23 10:28 AM  Result Value Ref Range   Aldosterone 1.7 0.0 - 30.0 ng/dL   Renin Activity, Plasma 0.514 0.167 - 5.380 ng/mL/hr   Aldos/Renin Ratio 3.3 0.0 - 30.0    Comment:                          Units:      ng/dL per ng/mL/hr  Thyroid Panel With TSH     Status: Abnormal   Collection Time: 08/15/23 10:28 AM  Result Value Ref Range   TSH 15.500 (H) 0.450 - 4.500 uIU/mL   T4, Total 5.3 4.5 - 12.0 ug/dL   T3 Uptake Ratio 22 (L) 24 - 39 %   Free Thyroxine Index 1.2 1.2 - 4.9  Basic metabolic panel     Status: Abnormal   Collection Time: 08/15/23 10:28 AM  Result Value Ref Range   Glucose 85 70 - 99 mg/dL   BUN 42 (H) 10 - 36 mg/dL   Creatinine, Ser 2.95 (H) 0.57 - 1.00 mg/dL   eGFR 17 (L) >62 ZH/YQM/5.78   BUN/Creatinine Ratio 17 12 - 28   Sodium 145 (H) 134 - 144 mmol/L   Potassium 4.7 3.5 - 5.2 mmol/L   Chloride 107 (H) 96 - 106 mmol/L   CO2 22 20 - 29 mmol/L   Calcium 9.4 8.7 - 10.3 mg/dL  Magnesium     Status: Abnormal   Collection Time: 08/15/23 10:28 AM  Result Value Ref Range   Magnesium 2.4 (H) 1.6 - 2.3 mg/dL        Carnell Christian, MD, MS

## 2023-09-24 NOTE — Patient Instructions (Signed)
 VISIT SUMMARY:  You were seen today for an evaluation of hypothyroidism. Your thyroid function was recently checked, revealing an elevated TSH level, indicating decreased thyroid activity. You have a history of chronic kidney disease stage 4 and resistant hypertension, but these were not the primary focus of today's visit.  YOUR PLAN:  -HYPOTHYROIDISM: Hypothyroidism means your thyroid gland is underactive and not producing enough thyroid hormone, which is important for energy, heart, kidney, and brain health. You will start taking levothyroxine 12.5 micrograms daily to help manage this condition. Please take the medication in the morning, one hour before eating or taking other medications. We will recheck your thyroid hormone levels in six weeks to see how the treatment is working.  -RESISTANT HYPERTENSION: Resistant hypertension means your high blood pressure is difficult to control with medications. This was noted but not the primary focus of today's visit.  -CHRONIC KIDNEY DISEASE STAGE 4: Chronic kidney disease stage 4 means your kidneys are significantly damaged and not working as well as they should. This was noted but not the primary focus of today's visit.  INSTRUCTIONS:  Please schedule a follow-up appointment in six weeks for a lab visit to recheck your thyroid hormone levels.

## 2023-10-21 ENCOUNTER — Encounter

## 2023-11-05 ENCOUNTER — Other Ambulatory Visit (INDEPENDENT_AMBULATORY_CARE_PROVIDER_SITE_OTHER)

## 2023-11-05 DIAGNOSIS — E039 Hypothyroidism, unspecified: Secondary | ICD-10-CM | POA: Diagnosis not present

## 2023-11-05 LAB — THYROID PANEL WITH TSH
Free Thyroxine Index: 1.7 (ref 1.4–3.8)
T3 Uptake: 29 % (ref 22–35)
T4, Total: 5.9 ug/dL (ref 5.1–11.9)
TSH: 15.42 m[IU]/L — ABNORMAL HIGH (ref 0.40–4.50)

## 2023-11-06 ENCOUNTER — Ambulatory Visit: Payer: Self-pay | Admitting: Family Medicine

## 2023-11-06 DIAGNOSIS — E039 Hypothyroidism, unspecified: Secondary | ICD-10-CM

## 2023-11-06 MED ORDER — LEVOTHYROXINE SODIUM 25 MCG PO TABS: 25.0000 ug | ORAL_TABLET | Freq: Every day | ORAL | 3 refills | Status: DC

## 2023-11-21 ENCOUNTER — Encounter (HOSPITAL_BASED_OUTPATIENT_CLINIC_OR_DEPARTMENT_OTHER): Admitting: Family

## 2023-12-05 ENCOUNTER — Encounter: Payer: Self-pay | Admitting: Family Medicine

## 2023-12-05 ENCOUNTER — Ambulatory Visit (INDEPENDENT_AMBULATORY_CARE_PROVIDER_SITE_OTHER): Admitting: Family Medicine

## 2023-12-05 VITALS — BP 162/84 | HR 70 | Temp 98.1°F | Ht 63.0 in | Wt 134.6 lb

## 2023-12-05 DIAGNOSIS — E039 Hypothyroidism, unspecified: Secondary | ICD-10-CM

## 2023-12-05 DIAGNOSIS — I10 Essential (primary) hypertension: Secondary | ICD-10-CM | POA: Diagnosis not present

## 2023-12-05 LAB — THYROID PANEL WITH TSH
Free Thyroxine Index: 1.4 (ref 1.4–3.8)
T3 Uptake: 27 % (ref 22–35)
T4, Total: 5.1 ug/dL (ref 5.1–11.9)
TSH: 13.26 m[IU]/L — ABNORMAL HIGH (ref 0.40–4.50)

## 2023-12-05 MED ORDER — METOPROLOL SUCCINATE ER 25 MG PO TB24: 25.0000 mg | ORAL_TABLET | Freq: Every day | ORAL | 2 refills | Status: DC

## 2023-12-05 NOTE — Progress Notes (Signed)
 Assessment & Plan   Assessment/Plan:    Assessment & Plan Hypertension Blood pressure is elevated at 154/80 mmHg. She is not currently taking losartan , which may contribute to the elevation. She is on amlodipine , losartan , and metoprolol  for hypertension management. No chest pain or dyspnea reported. - Ensure continuation of amlodipine , losartan , and metoprolol  as prescribed  Hypothyroidism She has elevated thyroid -stimulating hormone (TSH) levels. Her levothyroxine  dosage was increased to address this. She reports headaches, potentially associated with low thyroid  levels. No palpitations or other side effects reported. - Recheck thyroid  levels today - Adjust levothyroxine  dosage based on thyroid  levels  Peripheral Edema Reports chronic swelling in feet, managed with elevation and salt water soaking. Swelling is not severe today. Non-pharmacological methods are effective for her. - Recommend continued elevation of feet - Encourage use of non-pharmacological methods for swelling management      Medications Discontinued During This Encounter  Medication Reason   metoprolol  succinate (TOPROL  XL) 25 MG 24 hr tablet Reorder    Return in about 3 months (around 03/06/2024) for BP.        Subjective:   Encounter date: 12/05/2023  Stacy Huang is a 89 y.o. female who has Other fatigue; Stage 3b chronic kidney disease (HCC); History of deep vein thrombosis (DVT) of lower extremity; Hypothyroidism; Hyperlipidemia; Compression fracture of L1 vertebra (HCC); Dizziness and giddiness; Hearing loss of both ears due to cerumen impaction; Resistant hypertension; Orthostatic dizziness; Vertigo; Frail elderly; Difficulty transferring from wheelchair to chair; Glaucoma of right eye; Nocturnal leg cramps; Stage 4 chronic kidney disease (HCC); Chronic nonintractable headache; Postmenopausal estrogen deficiency; Chronic pain of right knee; Drug-induced constipation; Sarcopenia; and Personal  history of fall on their problem list..   She  has a past medical history of Acute hypoxemic respiratory failure due to COVID-19 [facility]) (02/27/2020), Essential hypertension (04/21/2021), Hypertension, Hypertensive urgency (02/27/2020), Shoulder fracture, right, and Thyroid  disease..   She presents with chief complaint of Follow-up .   Discussed the use of AI scribe software for clinical note transcription with the patient, who gave verbal consent to proceed.  History of Present Illness Stacy Huang is a 89 year old female with hypothyroidism who presents for thyroid  management.  Her thyroid  stimulating hormone (TSH) levels were previously elevated, leading to an increase in her levothyroxine  dosage. Since the dosage adjustment, she has not experienced palpitations or significant changes, but she has been having more frequent headaches. Her thyroid  levels are due for re-evaluation today.  She has a history of hypertension with consistently elevated blood pressure. She is currently not taking losartan , which may be contributing to her elevated blood pressure. Her current medications include amlodipine  10 mg and metoprolol  25 mg daily. No chest pain or shortness of breath.  She experiences swelling in her feet, which has been a long-standing issue. She manages the swelling with compression stockings and by elevating her feet. She also finds relief using salt water.     ROS  No past surgical history on file.  Outpatient Medications Prior to Visit  Medication Sig Dispense Refill   amLODipine  (NORVASC ) 10 MG tablet TAKE 1 TABLET(10 MG) BY MOUTH EVERY MORNING 90 tablet 2   levothyroxine  (SYNTHROID ) 25 MCG tablet Take 1 tablet (25 mcg total) by mouth daily. 90 tablet 3   polyethylene glycol powder (GLYCOLAX /MIRALAX ) 17 GM/SCOOP powder Take 17 g by mouth daily. 500 g 0   diclofenac  Sodium (VOLTAREN ) 1 % GEL Apply 4 g topically 4 (four) times daily as needed. (Patient not  taking: Reported on  12/05/2023) 100 g 3   losartan  (COZAAR ) 50 MG tablet Take 1 tablet (50 mg total) by mouth daily. (Patient not taking: Reported on 12/05/2023) 90 tablet 2   meclizine  (ANTIVERT ) 12.5 MG tablet Take 1 tablet (12.5 mg total) by mouth 3 (three) times daily as needed for dizziness. (Patient not taking: Reported on 12/05/2023) 90 tablet 3   metoprolol  succinate (TOPROL  XL) 25 MG 24 hr tablet Take 1 tablet (25 mg total) by mouth daily. 90 tablet 2   No facility-administered medications prior to visit.    Family History  Problem Relation Age of Onset   Alzheimer's disease Mother    Hypertension Father    Other Sister        Died at birth   Cancer Brother        spinal   Heart attack Brother     Social History   Socioeconomic History   Marital status: Widowed    Spouse name: Not on file   Number of children: 1   Years of education: Not on file   Highest education level: Not on file  Occupational History   Not on file  Tobacco Use   Smoking status: Never    Passive exposure: Never   Smokeless tobacco: Never  Vaping Use   Vaping status: Never Used  Substance and Sexual Activity   Alcohol use: Never   Drug use: Never   Sexual activity: Not on file  Other Topics Concern   Not on file  Social History Narrative   Not on file   Social Drivers of Health   Financial Resource Strain: Low Risk  (10/19/2022)   Overall Financial Resource Strain (CARDIA)    Difficulty of Paying Living Expenses: Not hard at all  Food Insecurity: No Food Insecurity (10/19/2022)   Hunger Vital Sign    Worried About Running Out of Food in the Last Year: Never true    Ran Out of Food in the Last Year: Never true  Transportation Needs: No Transportation Needs (10/19/2022)   PRAPARE - Administrator, Civil Service (Medical): No    Lack of Transportation (Non-Medical): No  Physical Activity: Inactive (10/19/2022)   Exercise Vital Sign    Days of Exercise per Week: 0 days    Minutes of Exercise per  Session: 0 min  Stress: No Stress Concern Present (10/19/2022)   [employer] of Occupational Health - Occupational Stress Questionnaire    Feeling of Stress : Not at all  Social Connections: Moderately Isolated (08/07/2022)   Social Connection and Isolation Panel    Frequency of Communication with Friends and Family: Twice a week    Frequency of Social Gatherings with Friends and Family: Once a week    Attends Religious Services: 1 to 4 times per year    Active Member of [employer] or Organizations: No    Attends Banker Meetings: Never    Marital Status: Widowed  Intimate Partner Violence: Not At Risk (08/07/2022)   Humiliation, Afraid, Rape, and Kick questionnaire    Fear of Current or Ex-Partner: No    Emotionally Abused: No    Physically Abused: No    Sexually Abused: No  Objective:  Physical Exam: BP (!) 154/80   Pulse 70   Temp 98.1 F (36.7 C) (Temporal)   Ht 5' 3 (1.6 m)   Wt 134 lb 9.6 oz (61.1 kg)   SpO2 99%   BMI 23.84 kg/m    Physical Exam VITALS: BP- 154/80 GENERAL: Alert, cooperative, well developed, no acute distress. HEENT: Normocephalic, normal oropharynx, moist mucous membranes. CHEST: Clear to auscultation bilaterally. No wheezes, rhonchi, or crackles. CARDIOVASCULAR: Normal heart rate and rhythm, S1 and S2 normal without murmurs. ABDOMEN: Soft, non-tender, non-distended, without organomegaly. Normal bowel sounds. EXTREMITIES: Trace swelling in ankles. No cyanosis or edema. NEUROLOGICAL: Cranial nerves grossly intact. Moves all extremities without gross motor or sensory deficit. Wheel chair bound   Physical Exam  No results found.  Recent Results (from the past 2160 hours)  Thyroid  Panel With TSH     Status: Abnormal   Collection Time: 11/05/23  8:49 AM  Result Value Ref Range   T3 Uptake 29 22 - 35 %   T4, Total 5.9 5.1 - 11.9 mcg/dL    Free Thyroxine Index 1.7 1.4 - 3.8   TSH 15.42 (H) 0.40 - 4.50 mIU/L        Beverley Adine Hummer, MD, MS

## 2023-12-05 NOTE — Patient Instructions (Signed)
  VISIT SUMMARY: Stacy Huang, you visited us  today for the management of your thyroid  condition and to address your elevated blood pressure and foot swelling. We reviewed your current medications and symptoms to ensure your treatment plan is effective.  YOUR PLAN: -HYPERTENSION: Hypertension means high blood pressure. Your blood pressure was elevated today at 154/80 mmHg. You are currently taking amlodipine , losartan , and metoprolol  to manage your blood pressure. Please continue taking these medications as prescribed.  -HYPOTHYROIDISM: Hypothyroidism means your thyroid  gland is underactive and not producing enough thyroid  hormone. Your thyroid -stimulating hormone (TSH) levels were previously high, so we increased your levothyroxine  dosage. We will recheck your thyroid  levels today and adjust your medication if needed. You mentioned experiencing headaches, which might be related to your thyroid  levels.  -PERIPHERAL EDEMA: Peripheral edema means swelling in your feet. You have been managing this with elevation and salt water soaking, which has been effective. Please continue these non-pharmacological methods to manage the swelling.  INSTRUCTIONS: We will recheck your thyroid  levels today and adjust your levothyroxine  dosage if needed. Please continue taking your blood pressure medications as prescribed and use non-pharmacological methods to manage your foot swelling.

## 2023-12-09 ENCOUNTER — Ambulatory Visit: Payer: Self-pay | Admitting: Family Medicine

## 2023-12-09 DIAGNOSIS — E039 Hypothyroidism, unspecified: Secondary | ICD-10-CM

## 2023-12-09 MED ORDER — LEVOTHYROXINE SODIUM 50 MCG PO TABS: 50.0000 ug | ORAL_TABLET | Freq: Every day | ORAL | 0 refills | Status: DC

## 2023-12-10 NOTE — Telephone Encounter (Signed)
-----   Message from Beverley KATHEE Hummer sent at 12/09/2023  4:15 PM EDT ----- TSH is still elevated. Recommend increasing 50 mcg a day. Prescription has been sent. Please have patient follow up for OV in 6 weeks for recheck.  ----- Message ----- From: Rebecka Hose Lab Results In Sent: 12/05/2023  11:58 PM EDT To: Beverley KATHEE Hummer, MD

## 2023-12-26 ENCOUNTER — Telehealth: Payer: Self-pay

## 2023-12-26 NOTE — Progress Notes (Unsigned)
 Complex Care Management Note Care Guide Note  12/26/2023 Name: Stacy Huang MRN: [id number] DOB: [birth year]   Complex Care Management Outreach Attempts: An unsuccessful telephone outreach was attempted today to offer the patient information about available complex care management services.  Follow Up Plan:  Additional outreach attempts will be made to offer the patient complex care management information and services.   Encounter Outcome:  No Answer  Dreama [facility]  [facility], [facility] Health Care Management Assistant Direct Dial: [phone]  Fax: [phone]

## 2023-12-27 NOTE — Progress Notes (Signed)
 Complex Care Management Note  Care Guide Note 12/27/2023 Name: [name] MRN: [id number] DOB: 02-10-1921  Stacy Huang is a 89 y.o. year old female who sees Sebastian Beverley NOVAK, MD for primary care. I reached out to Stacy Huang by phone today to offer complex care management services.  Ms. Helms was given information about Complex Care Management services today including:   The Complex Care Management services include support from the care team which includes your Nurse Care Manager, Clinical Social Worker, or Pharmacist.  The Complex Care Management team is here to help remove barriers to the health concerns and goals most important to you. Complex Care Management services are voluntary, and the patient may decline or stop services at any time by request to their care team member.   Complex Care Management Consent Status: Patient agreed to services and verbal consent obtained.   Follow up plan:  Telephone appointment with complex care management team member scheduled for:  01/03/24 at 12:30 p.m.   Encounter Outcome:  Patient Scheduled  Dreama [facility]  [facility], [facility] Health Care Management Assistant Direct Dial: [phone]  Fax: [phone]

## 2024-01-03 ENCOUNTER — Telehealth: Payer: Self-pay

## 2024-01-03 DIAGNOSIS — E039 Hypothyroidism, unspecified: Secondary | ICD-10-CM | POA: Diagnosis not present

## 2024-01-03 DIAGNOSIS — E7800 Pure hypercholesterolemia, unspecified: Secondary | ICD-10-CM | POA: Diagnosis not present

## 2024-01-03 DIAGNOSIS — R0989 Other specified symptoms and signs involving the circulatory and respiratory systems: Secondary | ICD-10-CM | POA: Diagnosis not present

## 2024-01-03 DIAGNOSIS — M25552 Pain in left hip: Secondary | ICD-10-CM | POA: Diagnosis not present

## 2024-01-03 DIAGNOSIS — Z9841 Cataract extraction status, right eye: Secondary | ICD-10-CM | POA: Diagnosis not present

## 2024-01-03 DIAGNOSIS — Z043 Encounter for examination and observation following other accident: Secondary | ICD-10-CM | POA: Diagnosis not present

## 2024-01-03 DIAGNOSIS — G319 Degenerative disease of nervous system, unspecified: Secondary | ICD-10-CM | POA: Diagnosis not present

## 2024-01-03 DIAGNOSIS — R6889 Other general symptoms and signs: Secondary | ICD-10-CM | POA: Diagnosis not present

## 2024-01-03 DIAGNOSIS — R338 Other retention of urine: Secondary | ICD-10-CM | POA: Diagnosis not present

## 2024-01-03 DIAGNOSIS — N179 Acute kidney failure, unspecified: Secondary | ICD-10-CM | POA: Diagnosis not present

## 2024-01-03 DIAGNOSIS — S72009A Fracture of unspecified part of neck of unspecified femur, initial encounter for closed fracture: Secondary | ICD-10-CM | POA: Diagnosis not present

## 2024-01-03 DIAGNOSIS — Z7982 Long term (current) use of aspirin: Secondary | ICD-10-CM | POA: Diagnosis not present

## 2024-01-03 DIAGNOSIS — N281 Cyst of kidney, acquired: Secondary | ICD-10-CM | POA: Diagnosis not present

## 2024-01-03 DIAGNOSIS — S72002A Fracture of unspecified part of neck of left femur, initial encounter for closed fracture: Secondary | ICD-10-CM | POA: Diagnosis not present

## 2024-01-03 DIAGNOSIS — M160 Bilateral primary osteoarthritis of hip: Secondary | ICD-10-CM | POA: Diagnosis not present

## 2024-01-03 DIAGNOSIS — I6782 Cerebral ischemia: Secondary | ICD-10-CM | POA: Diagnosis not present

## 2024-01-03 DIAGNOSIS — I1A0 Resistant hypertension: Secondary | ICD-10-CM | POA: Diagnosis not present

## 2024-01-03 DIAGNOSIS — M85871 Other specified disorders of bone density and structure, right ankle and foot: Secondary | ICD-10-CM | POA: Diagnosis not present

## 2024-01-03 DIAGNOSIS — I2510 Atherosclerotic heart disease of native coronary artery without angina pectoris: Secondary | ICD-10-CM | POA: Diagnosis not present

## 2024-01-03 DIAGNOSIS — Z0389 Encounter for observation for other suspected diseases and conditions ruled out: Secondary | ICD-10-CM | POA: Diagnosis not present

## 2024-01-03 DIAGNOSIS — Z66 Do not resuscitate: Secondary | ICD-10-CM | POA: Diagnosis not present

## 2024-01-03 DIAGNOSIS — S3993XA Unspecified injury of pelvis, initial encounter: Secondary | ICD-10-CM | POA: Diagnosis not present

## 2024-01-03 DIAGNOSIS — Z79899 Other long term (current) drug therapy: Secondary | ICD-10-CM | POA: Diagnosis not present

## 2024-01-03 DIAGNOSIS — I517 Cardiomegaly: Secondary | ICD-10-CM | POA: Diagnosis not present

## 2024-01-03 DIAGNOSIS — I1310 Hypertensive heart and chronic kidney disease without heart failure, with stage 1 through stage 4 chronic kidney disease, or unspecified chronic kidney disease: Secondary | ICD-10-CM | POA: Diagnosis not present

## 2024-01-03 DIAGNOSIS — I10 Essential (primary) hypertension: Secondary | ICD-10-CM | POA: Diagnosis not present

## 2024-01-03 DIAGNOSIS — N184 Chronic kidney disease, stage 4 (severe): Secondary | ICD-10-CM | POA: Diagnosis not present

## 2024-01-03 DIAGNOSIS — W19XXXA Unspecified fall, initial encounter: Secondary | ICD-10-CM | POA: Diagnosis not present

## 2024-01-03 DIAGNOSIS — S72042A Displaced fracture of base of neck of left femur, initial encounter for closed fracture: Secondary | ICD-10-CM | POA: Diagnosis not present

## 2024-01-03 DIAGNOSIS — R54 Age-related physical debility: Secondary | ICD-10-CM | POA: Diagnosis not present

## 2024-01-03 DIAGNOSIS — R918 Other nonspecific abnormal finding of lung field: Secondary | ICD-10-CM | POA: Diagnosis not present

## 2024-01-03 DIAGNOSIS — Z743 Need for continuous supervision: Secondary | ICD-10-CM | POA: Diagnosis not present

## 2024-01-03 DIAGNOSIS — Z9842 Cataract extraction status, left eye: Secondary | ICD-10-CM | POA: Diagnosis not present

## 2024-01-03 NOTE — Progress Notes (Unsigned)
   01/03/2024  Patient ID: Stacy Huang, female   DOB: 1921/04/01, 89 y.o.   MRN: [id number]  Outreach for scheduled telephone visit with patient's son, Dorina ([place]).  I was able to reach Mr. Loreda, but Ms. Remus had just fallen, and they were waiting on paramedics to arrive.  I will attempt to call to check on patient Monday and reschedule visit.  Channing [name], PharmD, DPLA

## 2024-01-04 DIAGNOSIS — S72042A Displaced fracture of base of neck of left femur, initial encounter for closed fracture: Secondary | ICD-10-CM | POA: Diagnosis not present

## 2024-01-04 DIAGNOSIS — D72829 Elevated white blood cell count, unspecified: Secondary | ICD-10-CM | POA: Diagnosis not present

## 2024-01-04 DIAGNOSIS — S72002A Fracture of unspecified part of neck of left femur, initial encounter for closed fracture: Secondary | ICD-10-CM | POA: Diagnosis not present

## 2024-01-04 DIAGNOSIS — N281 Cyst of kidney, acquired: Secondary | ICD-10-CM | POA: Diagnosis not present

## 2024-01-04 DIAGNOSIS — N179 Acute kidney failure, unspecified: Secondary | ICD-10-CM | POA: Diagnosis not present

## 2024-01-04 DIAGNOSIS — N184 Chronic kidney disease, stage 4 (severe): Secondary | ICD-10-CM | POA: Diagnosis not present

## 2024-01-04 DIAGNOSIS — S75002A Unspecified injury of femoral artery, left leg, initial encounter: Secondary | ICD-10-CM | POA: Diagnosis not present

## 2024-01-04 DIAGNOSIS — I10 Essential (primary) hypertension: Secondary | ICD-10-CM | POA: Diagnosis not present

## 2024-01-04 DIAGNOSIS — N189 Chronic kidney disease, unspecified: Secondary | ICD-10-CM | POA: Diagnosis not present

## 2024-01-05 DIAGNOSIS — S75002A Unspecified injury of femoral artery, left leg, initial encounter: Secondary | ICD-10-CM | POA: Diagnosis not present

## 2024-01-05 DIAGNOSIS — S72042A Displaced fracture of base of neck of left femur, initial encounter for closed fracture: Secondary | ICD-10-CM | POA: Diagnosis not present

## 2024-01-05 DIAGNOSIS — D72829 Elevated white blood cell count, unspecified: Secondary | ICD-10-CM | POA: Diagnosis not present

## 2024-01-05 DIAGNOSIS — N184 Chronic kidney disease, stage 4 (severe): Secondary | ICD-10-CM | POA: Diagnosis not present

## 2024-01-05 DIAGNOSIS — S72002A Fracture of unspecified part of neck of left femur, initial encounter for closed fracture: Secondary | ICD-10-CM | POA: Diagnosis not present

## 2024-01-05 DIAGNOSIS — Z96642 Presence of left artificial hip joint: Secondary | ICD-10-CM | POA: Diagnosis not present

## 2024-01-05 DIAGNOSIS — Z471 Aftercare following joint replacement surgery: Secondary | ICD-10-CM | POA: Diagnosis not present

## 2024-01-05 DIAGNOSIS — I10 Essential (primary) hypertension: Secondary | ICD-10-CM | POA: Diagnosis not present

## 2024-01-06 DIAGNOSIS — I10 Essential (primary) hypertension: Secondary | ICD-10-CM | POA: Diagnosis not present

## 2024-01-06 DIAGNOSIS — D72829 Elevated white blood cell count, unspecified: Secondary | ICD-10-CM | POA: Diagnosis not present

## 2024-01-06 DIAGNOSIS — N184 Chronic kidney disease, stage 4 (severe): Secondary | ICD-10-CM | POA: Diagnosis not present

## 2024-01-06 DIAGNOSIS — S75002A Unspecified injury of femoral artery, left leg, initial encounter: Secondary | ICD-10-CM | POA: Diagnosis not present

## 2024-01-06 DIAGNOSIS — S72002A Fracture of unspecified part of neck of left femur, initial encounter for closed fracture: Secondary | ICD-10-CM | POA: Diagnosis not present

## 2024-01-07 DIAGNOSIS — D72829 Elevated white blood cell count, unspecified: Secondary | ICD-10-CM | POA: Diagnosis not present

## 2024-01-07 DIAGNOSIS — I10 Essential (primary) hypertension: Secondary | ICD-10-CM | POA: Diagnosis not present

## 2024-01-07 DIAGNOSIS — S75002A Unspecified injury of femoral artery, left leg, initial encounter: Secondary | ICD-10-CM | POA: Diagnosis not present

## 2024-01-07 DIAGNOSIS — N184 Chronic kidney disease, stage 4 (severe): Secondary | ICD-10-CM | POA: Diagnosis not present

## 2024-01-07 DIAGNOSIS — S72002A Fracture of unspecified part of neck of left femur, initial encounter for closed fracture: Secondary | ICD-10-CM | POA: Diagnosis not present

## 2024-01-08 DIAGNOSIS — N184 Chronic kidney disease, stage 4 (severe): Secondary | ICD-10-CM | POA: Diagnosis not present

## 2024-01-08 DIAGNOSIS — S75002A Unspecified injury of femoral artery, left leg, initial encounter: Secondary | ICD-10-CM | POA: Diagnosis not present

## 2024-01-08 DIAGNOSIS — D72829 Elevated white blood cell count, unspecified: Secondary | ICD-10-CM | POA: Diagnosis not present

## 2024-01-08 DIAGNOSIS — S72002A Fracture of unspecified part of neck of left femur, initial encounter for closed fracture: Secondary | ICD-10-CM | POA: Diagnosis not present

## 2024-01-08 DIAGNOSIS — I10 Essential (primary) hypertension: Secondary | ICD-10-CM | POA: Diagnosis not present

## 2024-01-09 DIAGNOSIS — S72042A Displaced fracture of base of neck of left femur, initial encounter for closed fracture: Secondary | ICD-10-CM | POA: Diagnosis not present

## 2024-01-09 DIAGNOSIS — S72002A Fracture of unspecified part of neck of left femur, initial encounter for closed fracture: Secondary | ICD-10-CM | POA: Diagnosis not present

## 2024-01-09 DIAGNOSIS — N184 Chronic kidney disease, stage 4 (severe): Secondary | ICD-10-CM | POA: Diagnosis not present

## 2024-01-09 DIAGNOSIS — D72829 Elevated white blood cell count, unspecified: Secondary | ICD-10-CM | POA: Diagnosis not present

## 2024-01-09 DIAGNOSIS — I10 Essential (primary) hypertension: Secondary | ICD-10-CM | POA: Diagnosis not present

## 2024-01-10 DIAGNOSIS — N184 Chronic kidney disease, stage 4 (severe): Secondary | ICD-10-CM | POA: Diagnosis not present

## 2024-01-10 DIAGNOSIS — I129 Hypertensive chronic kidney disease with stage 1 through stage 4 chronic kidney disease, or unspecified chronic kidney disease: Secondary | ICD-10-CM | POA: Diagnosis not present

## 2024-01-10 DIAGNOSIS — I69828 Other speech and language deficits following other cerebrovascular disease: Secondary | ICD-10-CM | POA: Diagnosis not present

## 2024-01-10 DIAGNOSIS — D631 Anemia in chronic kidney disease: Secondary | ICD-10-CM | POA: Diagnosis not present

## 2024-01-10 DIAGNOSIS — H35033 Hypertensive retinopathy, bilateral: Secondary | ICD-10-CM | POA: Diagnosis not present

## 2024-01-10 DIAGNOSIS — D72829 Elevated white blood cell count, unspecified: Secondary | ICD-10-CM | POA: Diagnosis not present

## 2024-01-10 DIAGNOSIS — R2689 Other abnormalities of gait and mobility: Secondary | ICD-10-CM | POA: Diagnosis not present

## 2024-01-10 DIAGNOSIS — E039 Hypothyroidism, unspecified: Secondary | ICD-10-CM | POA: Diagnosis not present

## 2024-01-10 DIAGNOSIS — S72002A Fracture of unspecified part of neck of left femur, initial encounter for closed fracture: Secondary | ICD-10-CM | POA: Diagnosis not present

## 2024-01-10 DIAGNOSIS — R42 Dizziness and giddiness: Secondary | ICD-10-CM | POA: Diagnosis not present

## 2024-01-10 DIAGNOSIS — S72042D Displaced fracture of base of neck of left femur, subsequent encounter for closed fracture with routine healing: Secondary | ICD-10-CM | POA: Diagnosis not present

## 2024-01-10 DIAGNOSIS — S72042A Displaced fracture of base of neck of left femur, initial encounter for closed fracture: Secondary | ICD-10-CM | POA: Diagnosis not present

## 2024-01-10 DIAGNOSIS — E785 Hyperlipidemia, unspecified: Secondary | ICD-10-CM | POA: Diagnosis not present

## 2024-01-10 DIAGNOSIS — I10 Essential (primary) hypertension: Secondary | ICD-10-CM | POA: Diagnosis not present

## 2024-01-10 DIAGNOSIS — Z96642 Presence of left artificial hip joint: Secondary | ICD-10-CM | POA: Diagnosis not present

## 2024-01-10 DIAGNOSIS — H34831 Tributary (branch) retinal vein occlusion, right eye, with macular edema: Secondary | ICD-10-CM | POA: Diagnosis not present

## 2024-01-10 DIAGNOSIS — M6281 Muscle weakness (generalized): Secondary | ICD-10-CM | POA: Diagnosis not present

## 2024-01-10 DIAGNOSIS — Z9181 History of falling: Secondary | ICD-10-CM | POA: Diagnosis not present

## 2024-01-13 ENCOUNTER — Telehealth: Payer: Self-pay

## 2024-01-13 DIAGNOSIS — Z9181 History of falling: Secondary | ICD-10-CM | POA: Diagnosis not present

## 2024-01-13 DIAGNOSIS — S72042A Displaced fracture of base of neck of left femur, initial encounter for closed fracture: Secondary | ICD-10-CM | POA: Diagnosis not present

## 2024-01-13 DIAGNOSIS — I10 Essential (primary) hypertension: Secondary | ICD-10-CM | POA: Diagnosis not present

## 2024-01-13 DIAGNOSIS — R2689 Other abnormalities of gait and mobility: Secondary | ICD-10-CM | POA: Diagnosis not present

## 2024-01-13 DIAGNOSIS — S72042D Displaced fracture of base of neck of left femur, subsequent encounter for closed fracture with routine healing: Secondary | ICD-10-CM | POA: Diagnosis not present

## 2024-01-13 DIAGNOSIS — M6281 Muscle weakness (generalized): Secondary | ICD-10-CM | POA: Diagnosis not present

## 2024-01-13 DIAGNOSIS — R339 Retention of urine, unspecified: Secondary | ICD-10-CM | POA: Diagnosis not present

## 2024-01-13 DIAGNOSIS — N179 Acute kidney failure, unspecified: Secondary | ICD-10-CM | POA: Diagnosis not present

## 2024-01-13 DIAGNOSIS — N281 Cyst of kidney, acquired: Secondary | ICD-10-CM | POA: Diagnosis not present

## 2024-01-13 DIAGNOSIS — E039 Hypothyroidism, unspecified: Secondary | ICD-10-CM | POA: Diagnosis not present

## 2024-01-13 NOTE — Progress Notes (Unsigned)
   01/13/2024  Patient ID: Stacy Huang, female   DOB: 12-Aug-1920, 89 y.o.   MRN: [id number]  Patient outreach to discuss control of hypertension with patient's son, Stacy Huang [facility]).  I was able to reach Mr. Loreda, but [name] is currently in an assisted living facility, and son states she will likely be there another 3 weeks or so.  I will attempt to follow-up in another 4 weeks.  Channing [name], PharmD, DPLA

## 2024-01-14 DIAGNOSIS — I10 Essential (primary) hypertension: Secondary | ICD-10-CM | POA: Diagnosis not present

## 2024-01-14 DIAGNOSIS — R339 Retention of urine, unspecified: Secondary | ICD-10-CM | POA: Diagnosis not present

## 2024-01-14 DIAGNOSIS — S72042A Displaced fracture of base of neck of left femur, initial encounter for closed fracture: Secondary | ICD-10-CM | POA: Diagnosis not present

## 2024-01-14 DIAGNOSIS — N281 Cyst of kidney, acquired: Secondary | ICD-10-CM | POA: Diagnosis not present

## 2024-01-14 DIAGNOSIS — N179 Acute kidney failure, unspecified: Secondary | ICD-10-CM | POA: Diagnosis not present

## 2024-01-15 DIAGNOSIS — S72042A Displaced fracture of base of neck of left femur, initial encounter for closed fracture: Secondary | ICD-10-CM | POA: Diagnosis not present

## 2024-01-15 DIAGNOSIS — R296 Repeated falls: Secondary | ICD-10-CM | POA: Diagnosis not present

## 2024-01-15 DIAGNOSIS — I10 Essential (primary) hypertension: Secondary | ICD-10-CM | POA: Diagnosis not present

## 2024-01-15 DIAGNOSIS — E039 Hypothyroidism, unspecified: Secondary | ICD-10-CM | POA: Diagnosis not present

## 2024-01-15 DIAGNOSIS — N184 Chronic kidney disease, stage 4 (severe): Secondary | ICD-10-CM | POA: Diagnosis not present

## 2024-01-16 ENCOUNTER — Ambulatory Visit: Admitting: Family Medicine

## 2024-01-16 ENCOUNTER — Telehealth: Payer: Self-pay

## 2024-01-16 DIAGNOSIS — I10 Essential (primary) hypertension: Secondary | ICD-10-CM | POA: Diagnosis not present

## 2024-01-16 DIAGNOSIS — S72042A Displaced fracture of base of neck of left femur, initial encounter for closed fracture: Secondary | ICD-10-CM | POA: Diagnosis not present

## 2024-01-16 DIAGNOSIS — M6281 Muscle weakness (generalized): Secondary | ICD-10-CM | POA: Diagnosis not present

## 2024-01-16 DIAGNOSIS — S72042D Displaced fracture of base of neck of left femur, subsequent encounter for closed fracture with routine healing: Secondary | ICD-10-CM | POA: Diagnosis not present

## 2024-01-16 DIAGNOSIS — Z9181 History of falling: Secondary | ICD-10-CM | POA: Diagnosis not present

## 2024-01-16 DIAGNOSIS — R339 Retention of urine, unspecified: Secondary | ICD-10-CM | POA: Diagnosis not present

## 2024-01-16 DIAGNOSIS — N179 Acute kidney failure, unspecified: Secondary | ICD-10-CM | POA: Diagnosis not present

## 2024-01-16 DIAGNOSIS — R2689 Other abnormalities of gait and mobility: Secondary | ICD-10-CM | POA: Diagnosis not present

## 2024-01-16 DIAGNOSIS — N281 Cyst of kidney, acquired: Secondary | ICD-10-CM | POA: Diagnosis not present

## 2024-01-16 NOTE — Progress Notes (Signed)
 Complex Care Management Note Care Guide Note  01/16/2024 Name: Stacy Huang MRN: [id number] DOB: [birth year]   Complex Care Management Outreach Attempts: An unsuccessful telephone outreach was attempted today to offer the patient information about available complex care management services.  Follow Up Plan:  Additional outreach attempts will be made to offer the patient complex care management information and services.   Encounter Outcome:  No Answer  Dreama [facility]  [facility], [facility] Health Care Management Assistant Direct Dial: [phone]  Fax: [phone]

## 2024-01-17 DIAGNOSIS — N281 Cyst of kidney, acquired: Secondary | ICD-10-CM | POA: Diagnosis not present

## 2024-01-17 DIAGNOSIS — N179 Acute kidney failure, unspecified: Secondary | ICD-10-CM | POA: Diagnosis not present

## 2024-01-17 DIAGNOSIS — I10 Essential (primary) hypertension: Secondary | ICD-10-CM | POA: Diagnosis not present

## 2024-01-17 DIAGNOSIS — R339 Retention of urine, unspecified: Secondary | ICD-10-CM | POA: Diagnosis not present

## 2024-01-17 DIAGNOSIS — S72042A Displaced fracture of base of neck of left femur, initial encounter for closed fracture: Secondary | ICD-10-CM | POA: Diagnosis not present

## 2024-01-20 DIAGNOSIS — N179 Acute kidney failure, unspecified: Secondary | ICD-10-CM | POA: Diagnosis not present

## 2024-01-20 DIAGNOSIS — I10 Essential (primary) hypertension: Secondary | ICD-10-CM | POA: Diagnosis not present

## 2024-01-20 DIAGNOSIS — S72042D Displaced fracture of base of neck of left femur, subsequent encounter for closed fracture with routine healing: Secondary | ICD-10-CM | POA: Diagnosis not present

## 2024-01-20 DIAGNOSIS — M6281 Muscle weakness (generalized): Secondary | ICD-10-CM | POA: Diagnosis not present

## 2024-01-20 DIAGNOSIS — S72042A Displaced fracture of base of neck of left femur, initial encounter for closed fracture: Secondary | ICD-10-CM | POA: Diagnosis not present

## 2024-01-20 DIAGNOSIS — Z4789 Encounter for other orthopedic aftercare: Secondary | ICD-10-CM | POA: Diagnosis not present

## 2024-01-20 DIAGNOSIS — Z9181 History of falling: Secondary | ICD-10-CM | POA: Diagnosis not present

## 2024-01-20 DIAGNOSIS — N281 Cyst of kidney, acquired: Secondary | ICD-10-CM | POA: Diagnosis not present

## 2024-01-20 DIAGNOSIS — R2689 Other abnormalities of gait and mobility: Secondary | ICD-10-CM | POA: Diagnosis not present

## 2024-01-20 DIAGNOSIS — R339 Retention of urine, unspecified: Secondary | ICD-10-CM | POA: Diagnosis not present

## 2024-01-21 DIAGNOSIS — I10 Essential (primary) hypertension: Secondary | ICD-10-CM | POA: Diagnosis not present

## 2024-01-21 DIAGNOSIS — R339 Retention of urine, unspecified: Secondary | ICD-10-CM | POA: Diagnosis not present

## 2024-01-21 DIAGNOSIS — S72042A Displaced fracture of base of neck of left femur, initial encounter for closed fracture: Secondary | ICD-10-CM | POA: Diagnosis not present

## 2024-01-21 DIAGNOSIS — N281 Cyst of kidney, acquired: Secondary | ICD-10-CM | POA: Diagnosis not present

## 2024-01-21 DIAGNOSIS — N179 Acute kidney failure, unspecified: Secondary | ICD-10-CM | POA: Diagnosis not present

## 2024-01-21 NOTE — Progress Notes (Signed)
 Complex Care Management Note Care Guide Note  01/21/2024 Name: Stacy Huang MRN: [id number] DOB: 1920/08/27   Complex Care Management Outreach Attempts: A second unsuccessful outreach was attempted today to offer the patient with information about available complex care management services.  Follow Up Plan:  No further outreach attempts will be made at this time. We have been unable to contact the patient to offer or enroll patient in complex care management services.  Encounter Outcome:  No Answer  Dreama [facility]  [facility], [facility] Health Care Management Assistant Direct Dial: [phone]  Fax: [phone]

## 2024-01-22 DIAGNOSIS — N281 Cyst of kidney, acquired: Secondary | ICD-10-CM | POA: Diagnosis not present

## 2024-01-22 DIAGNOSIS — R339 Retention of urine, unspecified: Secondary | ICD-10-CM | POA: Diagnosis not present

## 2024-01-22 DIAGNOSIS — I10 Essential (primary) hypertension: Secondary | ICD-10-CM | POA: Diagnosis not present

## 2024-01-22 DIAGNOSIS — N179 Acute kidney failure, unspecified: Secondary | ICD-10-CM | POA: Diagnosis not present

## 2024-01-22 DIAGNOSIS — S72042A Displaced fracture of base of neck of left femur, initial encounter for closed fracture: Secondary | ICD-10-CM | POA: Diagnosis not present

## 2024-01-23 DIAGNOSIS — Z9181 History of falling: Secondary | ICD-10-CM | POA: Diagnosis not present

## 2024-01-23 DIAGNOSIS — M6281 Muscle weakness (generalized): Secondary | ICD-10-CM | POA: Diagnosis not present

## 2024-01-23 DIAGNOSIS — S72042D Displaced fracture of base of neck of left femur, subsequent encounter for closed fracture with routine healing: Secondary | ICD-10-CM | POA: Diagnosis not present

## 2024-01-23 DIAGNOSIS — R2689 Other abnormalities of gait and mobility: Secondary | ICD-10-CM | POA: Diagnosis not present

## 2024-01-24 DIAGNOSIS — S72042A Displaced fracture of base of neck of left femur, initial encounter for closed fracture: Secondary | ICD-10-CM | POA: Diagnosis not present

## 2024-01-24 DIAGNOSIS — E039 Hypothyroidism, unspecified: Secondary | ICD-10-CM | POA: Diagnosis not present

## 2024-01-24 DIAGNOSIS — N179 Acute kidney failure, unspecified: Secondary | ICD-10-CM | POA: Diagnosis not present

## 2024-01-24 DIAGNOSIS — N281 Cyst of kidney, acquired: Secondary | ICD-10-CM | POA: Diagnosis not present

## 2024-01-24 DIAGNOSIS — I10 Essential (primary) hypertension: Secondary | ICD-10-CM | POA: Diagnosis not present

## 2024-01-27 DIAGNOSIS — Z4789 Encounter for other orthopedic aftercare: Secondary | ICD-10-CM | POA: Diagnosis not present

## 2024-01-27 DIAGNOSIS — S72042D Displaced fracture of base of neck of left femur, subsequent encounter for closed fracture with routine healing: Secondary | ICD-10-CM | POA: Diagnosis not present

## 2024-01-27 DIAGNOSIS — M6281 Muscle weakness (generalized): Secondary | ICD-10-CM | POA: Diagnosis not present

## 2024-01-27 DIAGNOSIS — Z9181 History of falling: Secondary | ICD-10-CM | POA: Diagnosis not present

## 2024-01-27 DIAGNOSIS — R2689 Other abnormalities of gait and mobility: Secondary | ICD-10-CM | POA: Diagnosis not present

## 2024-01-28 DIAGNOSIS — M6281 Muscle weakness (generalized): Secondary | ICD-10-CM | POA: Diagnosis not present

## 2024-01-28 DIAGNOSIS — E039 Hypothyroidism, unspecified: Secondary | ICD-10-CM | POA: Diagnosis not present

## 2024-01-28 DIAGNOSIS — I10 Essential (primary) hypertension: Secondary | ICD-10-CM | POA: Diagnosis not present

## 2024-01-28 DIAGNOSIS — N179 Acute kidney failure, unspecified: Secondary | ICD-10-CM | POA: Diagnosis not present

## 2024-01-28 DIAGNOSIS — N281 Cyst of kidney, acquired: Secondary | ICD-10-CM | POA: Diagnosis not present

## 2024-01-28 DIAGNOSIS — S72042A Displaced fracture of base of neck of left femur, initial encounter for closed fracture: Secondary | ICD-10-CM | POA: Diagnosis not present

## 2024-01-29 DIAGNOSIS — E039 Hypothyroidism, unspecified: Secondary | ICD-10-CM | POA: Diagnosis not present

## 2024-01-29 DIAGNOSIS — N179 Acute kidney failure, unspecified: Secondary | ICD-10-CM | POA: Diagnosis not present

## 2024-01-29 DIAGNOSIS — N281 Cyst of kidney, acquired: Secondary | ICD-10-CM | POA: Diagnosis not present

## 2024-01-29 DIAGNOSIS — S72042A Displaced fracture of base of neck of left femur, initial encounter for closed fracture: Secondary | ICD-10-CM | POA: Diagnosis not present

## 2024-01-29 DIAGNOSIS — I10 Essential (primary) hypertension: Secondary | ICD-10-CM | POA: Diagnosis not present

## 2024-01-29 DIAGNOSIS — M6281 Muscle weakness (generalized): Secondary | ICD-10-CM | POA: Diagnosis not present

## 2024-01-30 DIAGNOSIS — I69828 Other speech and language deficits following other cerebrovascular disease: Secondary | ICD-10-CM | POA: Diagnosis not present

## 2024-01-30 DIAGNOSIS — R2689 Other abnormalities of gait and mobility: Secondary | ICD-10-CM | POA: Diagnosis not present

## 2024-01-30 DIAGNOSIS — S72042D Displaced fracture of base of neck of left femur, subsequent encounter for closed fracture with routine healing: Secondary | ICD-10-CM | POA: Diagnosis not present

## 2024-01-31 DIAGNOSIS — I69828 Other speech and language deficits following other cerebrovascular disease: Secondary | ICD-10-CM | POA: Diagnosis not present

## 2024-01-31 DIAGNOSIS — S72042A Displaced fracture of base of neck of left femur, initial encounter for closed fracture: Secondary | ICD-10-CM | POA: Diagnosis not present

## 2024-01-31 DIAGNOSIS — M6281 Muscle weakness (generalized): Secondary | ICD-10-CM | POA: Diagnosis not present

## 2024-01-31 DIAGNOSIS — S72042D Displaced fracture of base of neck of left femur, subsequent encounter for closed fracture with routine healing: Secondary | ICD-10-CM | POA: Diagnosis not present

## 2024-01-31 DIAGNOSIS — R2689 Other abnormalities of gait and mobility: Secondary | ICD-10-CM | POA: Diagnosis not present

## 2024-01-31 DIAGNOSIS — N281 Cyst of kidney, acquired: Secondary | ICD-10-CM | POA: Diagnosis not present

## 2024-01-31 DIAGNOSIS — N179 Acute kidney failure, unspecified: Secondary | ICD-10-CM | POA: Diagnosis not present

## 2024-01-31 DIAGNOSIS — I10 Essential (primary) hypertension: Secondary | ICD-10-CM | POA: Diagnosis not present

## 2024-01-31 DIAGNOSIS — E039 Hypothyroidism, unspecified: Secondary | ICD-10-CM | POA: Diagnosis not present

## 2024-02-03 DIAGNOSIS — I10 Essential (primary) hypertension: Secondary | ICD-10-CM | POA: Diagnosis not present

## 2024-02-03 DIAGNOSIS — N179 Acute kidney failure, unspecified: Secondary | ICD-10-CM | POA: Diagnosis not present

## 2024-02-03 DIAGNOSIS — S72042D Displaced fracture of base of neck of left femur, subsequent encounter for closed fracture with routine healing: Secondary | ICD-10-CM | POA: Diagnosis not present

## 2024-02-03 DIAGNOSIS — E039 Hypothyroidism, unspecified: Secondary | ICD-10-CM | POA: Diagnosis not present

## 2024-02-03 DIAGNOSIS — I69828 Other speech and language deficits following other cerebrovascular disease: Secondary | ICD-10-CM | POA: Diagnosis not present

## 2024-02-03 DIAGNOSIS — S72042A Displaced fracture of base of neck of left femur, initial encounter for closed fracture: Secondary | ICD-10-CM | POA: Diagnosis not present

## 2024-02-03 DIAGNOSIS — Z9181 History of falling: Secondary | ICD-10-CM | POA: Diagnosis not present

## 2024-02-03 DIAGNOSIS — N281 Cyst of kidney, acquired: Secondary | ICD-10-CM | POA: Diagnosis not present

## 2024-02-03 DIAGNOSIS — R2689 Other abnormalities of gait and mobility: Secondary | ICD-10-CM | POA: Diagnosis not present

## 2024-02-03 DIAGNOSIS — M6281 Muscle weakness (generalized): Secondary | ICD-10-CM | POA: Diagnosis not present

## 2024-02-04 ENCOUNTER — Telehealth: Payer: Self-pay

## 2024-02-04 DIAGNOSIS — M6281 Muscle weakness (generalized): Secondary | ICD-10-CM | POA: Diagnosis not present

## 2024-02-04 DIAGNOSIS — S72042A Displaced fracture of base of neck of left femur, initial encounter for closed fracture: Secondary | ICD-10-CM | POA: Diagnosis not present

## 2024-02-04 DIAGNOSIS — N179 Acute kidney failure, unspecified: Secondary | ICD-10-CM | POA: Diagnosis not present

## 2024-02-04 DIAGNOSIS — I69828 Other speech and language deficits following other cerebrovascular disease: Secondary | ICD-10-CM | POA: Diagnosis not present

## 2024-02-04 DIAGNOSIS — E039 Hypothyroidism, unspecified: Secondary | ICD-10-CM | POA: Diagnosis not present

## 2024-02-04 DIAGNOSIS — N281 Cyst of kidney, acquired: Secondary | ICD-10-CM | POA: Diagnosis not present

## 2024-02-04 DIAGNOSIS — R2689 Other abnormalities of gait and mobility: Secondary | ICD-10-CM | POA: Diagnosis not present

## 2024-02-04 DIAGNOSIS — S72042D Displaced fracture of base of neck of left femur, subsequent encounter for closed fracture with routine healing: Secondary | ICD-10-CM | POA: Diagnosis not present

## 2024-02-04 DIAGNOSIS — I10 Essential (primary) hypertension: Secondary | ICD-10-CM | POA: Diagnosis not present

## 2024-02-04 NOTE — Telephone Encounter (Signed)
 Copied from CRM #[id number]. Topic: Clinical - Order For Equipment >> Feb 03, 2024  9:29 AM Mesmerise C wrote: Reason for CRM: Patient's friend needing prescription for mattress with good padding, diapers, cover for mattress that shoots the air through it, balloons for her legs requesting Home Health care to go to [facility] can reach friend Johnston [phone] >> Feb 03, 2024  9:35 AM Mesmerise C wrote: Also inquiring about portable hand rails for bed and table tray as well

## 2024-02-04 NOTE — Telephone Encounter (Signed)
 Called patient to clarify request; left VM for call back

## 2024-02-05 DIAGNOSIS — R2689 Other abnormalities of gait and mobility: Secondary | ICD-10-CM | POA: Diagnosis not present

## 2024-02-05 DIAGNOSIS — I10 Essential (primary) hypertension: Secondary | ICD-10-CM | POA: Diagnosis not present

## 2024-02-05 DIAGNOSIS — S72042D Displaced fracture of base of neck of left femur, subsequent encounter for closed fracture with routine healing: Secondary | ICD-10-CM | POA: Diagnosis not present

## 2024-02-05 DIAGNOSIS — I69828 Other speech and language deficits following other cerebrovascular disease: Secondary | ICD-10-CM | POA: Diagnosis not present

## 2024-02-05 DIAGNOSIS — N179 Acute kidney failure, unspecified: Secondary | ICD-10-CM | POA: Diagnosis not present

## 2024-02-05 DIAGNOSIS — E039 Hypothyroidism, unspecified: Secondary | ICD-10-CM | POA: Diagnosis not present

## 2024-02-05 DIAGNOSIS — S72042A Displaced fracture of base of neck of left femur, initial encounter for closed fracture: Secondary | ICD-10-CM | POA: Diagnosis not present

## 2024-02-05 DIAGNOSIS — M6281 Muscle weakness (generalized): Secondary | ICD-10-CM | POA: Diagnosis not present

## 2024-02-05 DIAGNOSIS — N281 Cyst of kidney, acquired: Secondary | ICD-10-CM | POA: Diagnosis not present

## 2024-02-05 NOTE — Telephone Encounter (Signed)
 Copied from CRM [id number]. Topic: Clinical - Order For Equipment >> Feb 05, 2024  7:58 AM Stacy Huang I wrote: Reason for CRM: Patient would like a sling for the shower and sling for the wheelchair.

## 2024-02-05 NOTE — Telephone Encounter (Signed)
 forwarding message below.  If you approve request, I can place a DME order. Please advise.

## 2024-02-06 ENCOUNTER — Other Ambulatory Visit: Payer: Self-pay

## 2024-02-06 DIAGNOSIS — M6281 Muscle weakness (generalized): Secondary | ICD-10-CM | POA: Diagnosis not present

## 2024-02-06 DIAGNOSIS — I10 Essential (primary) hypertension: Secondary | ICD-10-CM | POA: Diagnosis not present

## 2024-02-06 DIAGNOSIS — N281 Cyst of kidney, acquired: Secondary | ICD-10-CM | POA: Diagnosis not present

## 2024-02-06 DIAGNOSIS — N179 Acute kidney failure, unspecified: Secondary | ICD-10-CM | POA: Diagnosis not present

## 2024-02-06 DIAGNOSIS — S72042A Displaced fracture of base of neck of left femur, initial encounter for closed fracture: Secondary | ICD-10-CM | POA: Diagnosis not present

## 2024-02-06 DIAGNOSIS — Y93F2 Activity, caregiving, lifting: Secondary | ICD-10-CM

## 2024-02-06 DIAGNOSIS — E039 Hypothyroidism, unspecified: Secondary | ICD-10-CM | POA: Diagnosis not present

## 2024-02-06 NOTE — Telephone Encounter (Signed)
 DME was printed and given to patient son Mr. Dorina.

## 2024-02-06 NOTE — Telephone Encounter (Signed)
 DME order was signed and given to patient son Mr Dorina while in clinic today.

## 2024-02-07 ENCOUNTER — Other Ambulatory Visit: Payer: Self-pay

## 2024-02-07 DIAGNOSIS — Z742 Need for assistance at home and no other household member able to render care: Secondary | ICD-10-CM

## 2024-02-12 ENCOUNTER — Inpatient Hospital Stay: Admitting: Family Medicine

## 2024-02-12 DIAGNOSIS — S81811A Laceration without foreign body, right lower leg, initial encounter: Secondary | ICD-10-CM | POA: Diagnosis not present

## 2024-02-14 ENCOUNTER — Telehealth: Payer: Self-pay

## 2024-02-14 ENCOUNTER — Encounter: Payer: Self-pay | Admitting: Family Medicine

## 2024-02-14 ENCOUNTER — Ambulatory Visit (INDEPENDENT_AMBULATORY_CARE_PROVIDER_SITE_OTHER): Admitting: Family Medicine

## 2024-02-14 VITALS — BP 140/80 | HR 69 | Temp 98.9°F | Ht 63.0 in | Wt 134.0 lb

## 2024-02-14 DIAGNOSIS — Z7409 Other reduced mobility: Secondary | ICD-10-CM

## 2024-02-14 DIAGNOSIS — R54 Age-related physical debility: Secondary | ICD-10-CM

## 2024-02-14 DIAGNOSIS — E039 Hypothyroidism, unspecified: Secondary | ICD-10-CM

## 2024-02-14 DIAGNOSIS — I1A0 Resistant hypertension: Secondary | ICD-10-CM

## 2024-02-14 MED ORDER — LOSARTAN POTASSIUM 50 MG PO TABS
50.0000 mg | ORAL_TABLET | Freq: Every day | ORAL | 2 refills | Status: DC
Start: 2024-02-14 — End: 2024-02-28

## 2024-02-14 NOTE — Telephone Encounter (Signed)
 Noted. OV for ED f/u is scheduled for 02/14/2024.

## 2024-02-14 NOTE — Telephone Encounter (Signed)
 Copied from CRM [id number]. Topic: General - Other >> Feb 14, 2024  9:27 AM Deaijah H wrote: Reason for CRM: [facility] called in to inform of delay will start care tomorrow (02/15/24) instead of today

## 2024-02-14 NOTE — Progress Notes (Signed)
 Assessment & Plan   Assessment/Plan:    Problem List Items Addressed This Visit   None       Assessment and Plan Assessment & Plan Left hip fracture, status post hemiarthroplasty with reduced mobility Status post left hip hemiarthroplasty following a fracture in July. Currently receiving home health care with reduced mobility requiring assistance with ambulation. - Order a platform walker without wheels for ambulation support.  Resistant hypertension Hypertension is resistant but fairly well controlled with a blood pressure of 140/80 mmHg. She is on amlodipine , losartan , and metoprolol  but has not been taking her medication consistently due to lack of administration in the home setting. - Refill losartan  prescription. - Encourage follow-up with Doctor Ladona at the hypertension clinic.  Hypothyroidism Hypothyroidism managed with levothyroxine  50 mcg. - Order thyroid  panel to assess thyroid  function.      There are no discontinued medications.  No follow-ups on file.        Subjective:   Encounter date: 02/14/2024  Stacy Huang is a 89 y.o. female who has Other fatigue; Stage 3b chronic kidney disease (HCC); History of deep vein thrombosis (DVT) of lower extremity; Hypothyroidism; Hyperlipidemia; Compression fracture of L1 vertebra (HCC); Dizziness and giddiness; Hearing loss of both ears due to cerumen impaction; Resistant hypertension; Orthostatic dizziness; Vertigo; Frail elderly; Difficulty transferring from wheelchair to chair; Glaucoma of right eye; Nocturnal leg cramps; Stage 4 chronic kidney disease (HCC); Chronic nonintractable headache; Postmenopausal estrogen deficiency; Chronic pain of right knee; Drug-induced constipation; Sarcopenia; and Personal history of fall on their problem list..   She  has a past medical history of Acute hypoxemic respiratory failure due to COVID-19 [facility]) (02/27/2020), Essential hypertension (04/21/2021), Hypertension,  Hypertensive urgency (02/27/2020), Shoulder fracture, right, and Thyroid  disease..   She presents with chief complaint of Hospitalization Follow-up (need walker with no wheels, wanting something for leg pain, need bp medication wasn't taking it in the nursing home losartan  (COZAAR ) 50 MG tablet) .   Discussed the use of AI scribe software for clinical note transcription with the patient, who gave verbal consent to proceed.  History of Present Illness Stacy Huang is a 89 year old female who presents for an ED follow-up after a recent hip fracture.  She experienced a fall in July resulting in a hip fracture and underwent an arthroplasty hip hemireplacement of her left hip. She has already followed up with orthopedics and is scheduled to start home health care tomorrow.  She has a history of resistant hypertension, currently managed with amlodipine  10 mg, losartan  50 mg, and metoprolol  25 mg. Her blood pressure is fairly well controlled at 140/80 mmHg. She needs a refill on her losartan .  She also has hypothyroidism and is taking levothyroxine  50 mcg.  No shortness of breath or chest pain.     ROS  No past surgical history on file.  Outpatient Medications Prior to Visit  Medication Sig Dispense Refill   amLODipine  (NORVASC ) 10 MG tablet TAKE 1 TABLET(10 MG) BY MOUTH EVERY MORNING 90 tablet 2   levothyroxine  (SYNTHROID ) 50 MCG tablet Take 1 tablet (50 mcg total) by mouth daily before breakfast. 90 tablet 0   metoprolol  succinate (TOPROL  XL) 25 MG 24 hr tablet Take 1 tablet (25 mg total) by mouth daily. 90 tablet 2   diclofenac  Sodium (VOLTAREN ) 1 % GEL Apply 4 g topically 4 (four) times daily as needed. (Patient not taking: Reported on 12/05/2023) 100 g 3   losartan  (COZAAR ) 50 MG tablet Take 1 tablet (  50 mg total) by mouth daily. (Patient not taking: Reported on 12/05/2023) 90 tablet 2   meclizine  (ANTIVERT ) 12.5 MG tablet Take 1 tablet (12.5 mg total) by mouth 3 (three) times daily  as needed for dizziness. (Patient not taking: Reported on 12/05/2023) 90 tablet 3   polyethylene glycol powder (GLYCOLAX /MIRALAX ) 17 GM/SCOOP powder Take 17 g by mouth daily. (Patient not taking: Reported on 02/14/2024) 500 g 0   No facility-administered medications prior to visit.    Family History  Problem Relation Age of Onset   Alzheimer's disease Mother    Hypertension Father    Other Sister        Died at birth   Cancer Brother        spinal   Heart attack Brother     Social History   Socioeconomic History   Marital status: Widowed    Spouse name: Not on file   Number of children: 1   Years of education: Not on file   Highest education level: Not on file  Occupational History   Not on file  Tobacco Use   Smoking status: Never    Passive exposure: Never   Smokeless tobacco: Never  Vaping Use   Vaping status: Never Used  Substance and Sexual Activity   Alcohol use: Never   Drug use: Never   Sexual activity: Not on file  Other Topics Concern   Not on file  Social History Narrative   Not on file   Social Drivers of Health   Financial Resource Strain: Low Risk  (10/19/2022)   Overall Financial Resource Strain (CARDIA)    Difficulty of Paying Living Expenses: Not hard at all  Food Insecurity: No Food Insecurity (10/19/2022)   Hunger Vital Sign    Worried About Running Out of Food in the Last Year: Never true    Ran Out of Food in the Last Year: Never true  Transportation Needs: No Transportation Needs (10/19/2022)   PRAPARE - Administrator, Civil Service (Medical): No    Lack of Transportation (Non-Medical): No  Physical Activity: Inactive (10/19/2022)   Exercise Vital Sign    Days of Exercise per Week: 0 days    Minutes of Exercise per Session: 0 min  Stress: No Stress Concern Present (10/19/2022)   [employer] of Occupational Health - Occupational Stress Questionnaire    Feeling of Stress : Not at all  Social Connections: Moderately Isolated  (08/07/2022)   Social Connection and Isolation Panel    Frequency of Communication with Friends and Family: Twice a week    Frequency of Social Gatherings with Friends and Family: Once a week    Attends Religious Services: 1 to 4 times per year    Active Member of [employer] or Organizations: No    Attends Banker Meetings: Never    Marital Status: Widowed  Intimate Partner Violence: Not At Risk (08/07/2022)   Humiliation, Afraid, Rape, and Kick questionnaire    Fear of Current or Ex-Partner: No    Emotionally Abused: No    Physically Abused: No    Sexually Abused: No  Objective:  Physical Exam: BP (!) 140/80   Pulse 69   Temp 98.9 F (37.2 C) (Temporal)   Ht 5' 3 (1.6 m)   Wt 134 lb (60.8 kg)   SpO2 98%   BMI 23.74 kg/m    Physical Exam VITALS: BP- 140/80 GENERAL: Alert, cooperative, well developed, no acute distress HEENT: Normocephalic, normal oropharynx, moist mucous membranes CHEST: Clear to auscultation bilaterally, no wheezes, rhonchi, or crackles CARDIOVASCULAR: Normal heart rate and rhythm, S1 and S2 normal without murmurs ABDOMEN: Soft, non-tender, non-distended, without organomegaly, normal bowel sounds EXTREMITIES: No cyanosis or edema NEUROLOGICAL: Cranial nerves grossly intact, moves all extremities without gross motor or sensory deficit   Physical Exam  No results found.  Recent Results (from the past 2160 hours)  Thyroid  Panel With TSH     Status: Abnormal   Collection Time: 12/05/23 10:37 AM  Result Value Ref Range   T3 Uptake 27 22 - 35 %   T4, Total 5.1 5.1 - 11.9 mcg/dL   Free Thyroxine Index 1.4 1.4 - 3.8   TSH 13.26 (H) 0.40 - 4.50 mIU/L        Beverley Adine Hummer, MD, MS

## 2024-02-14 NOTE — Patient Instructions (Addendum)
  VISIT SUMMARY: Stacy Huang, a 89 year old female, visited for a follow-up after her recent hip fracture and surgery. She also has a history of resistant hypertension and hypothyroidism.  YOUR PLAN: -LEFT HIP FRACTURE, STATUS POST HEMIARTHROPLASTY WITH REDUCED MOBILITY: You had a hip fracture in July and underwent surgery to replace part of your hip. You are currently receiving home health care and need assistance with walking. We will order a platform walker without wheels to help you move around more safely.  -RESISTANT HYPERTENSION: Your blood pressure is higher than normal and requires multiple medications to control. It is currently fairly well controlled at 140/80 mmHg. We will refill your losartan  prescription and encourage you to follow up with Doctor Ladona at the hypertension clinic.  -HYPOTHYROIDISM: You have an underactive thyroid , which is being managed with levothyroxine . We will order a thyroid  panel to check your thyroid  function.  INSTRUCTIONS: Please ensure you take your medications consistently. Follow up with Doctor Ganji at the hypertension clinic for your blood pressure management. A thyroid  panel has been ordered to assess your thyroid  function. Use the platform walker for safer ambulation.

## 2024-02-15 DIAGNOSIS — E039 Hypothyroidism, unspecified: Secondary | ICD-10-CM | POA: Diagnosis not present

## 2024-02-15 DIAGNOSIS — Z604 Social exclusion and rejection: Secondary | ICD-10-CM | POA: Diagnosis not present

## 2024-02-15 DIAGNOSIS — Z9181 History of falling: Secondary | ICD-10-CM | POA: Diagnosis not present

## 2024-02-15 DIAGNOSIS — N184 Chronic kidney disease, stage 4 (severe): Secondary | ICD-10-CM | POA: Diagnosis not present

## 2024-02-15 DIAGNOSIS — R296 Repeated falls: Secondary | ICD-10-CM | POA: Diagnosis not present

## 2024-02-15 DIAGNOSIS — Z96642 Presence of left artificial hip joint: Secondary | ICD-10-CM | POA: Diagnosis not present

## 2024-02-15 DIAGNOSIS — E785 Hyperlipidemia, unspecified: Secondary | ICD-10-CM | POA: Diagnosis not present

## 2024-02-15 DIAGNOSIS — S72042D Displaced fracture of base of neck of left femur, subsequent encounter for closed fracture with routine healing: Secondary | ICD-10-CM | POA: Diagnosis not present

## 2024-02-15 DIAGNOSIS — N179 Acute kidney failure, unspecified: Secondary | ICD-10-CM | POA: Diagnosis not present

## 2024-02-15 DIAGNOSIS — I129 Hypertensive chronic kidney disease with stage 1 through stage 4 chronic kidney disease, or unspecified chronic kidney disease: Secondary | ICD-10-CM | POA: Diagnosis not present

## 2024-02-17 DIAGNOSIS — Z4789 Encounter for other orthopedic aftercare: Secondary | ICD-10-CM | POA: Diagnosis not present

## 2024-02-18 ENCOUNTER — Other Ambulatory Visit: Payer: Self-pay

## 2024-02-18 NOTE — Telephone Encounter (Signed)
 Called Home Health  ([facility]). Left VM to approve verbal orders.

## 2024-02-18 NOTE — Telephone Encounter (Signed)
 Copied from CRM #[id number]. Topic: Clinical - Home Health Verbal Orders >> Feb 18, 2024  8:59 AM Mesmerise C wrote: Caller/Agency: [facility] Callback Number: [phone] Confidential Vm Service Requested: Physical Therapy Frequency: 1 week 1 2 week 2 1 week 1  Any new concerns about the patient? No

## 2024-02-18 NOTE — Progress Notes (Signed)
   02/18/2024  Patient ID: Stacy Huang, female   DOB: 1921-04-08, 89 y.o.   MRN: [id number]  Patient outreach to follow-up on blood pressure control and adherence to medications.  I was able to reach patient and her son, Dorina, but neither were able to hear me well enough to complete a telephone visit.  Patient states she has all of her medications and does not need refills on anything, but fill history reflects patient is likely due for refills on medications.  It appears home health has been approved to come to patient's home; I will see if I am able to coordinate with them to verify medications patient is currently taking and if home BP is being monitored.  Channing [name], PharmD, DPLA

## 2024-02-21 ENCOUNTER — Telehealth: Payer: Self-pay | Admitting: Family Medicine

## 2024-02-21 NOTE — Telephone Encounter (Signed)
 Patient dropped off document Home Health Certificate (Order ID [id number]), to be filled out by provider. Patient requested to send it back via Fax within 7-days. Document is located in providers tray at front office.Please advise at [phone]   Home health order came through fax. I put in the dr box

## 2024-02-21 NOTE — Telephone Encounter (Signed)
 Copied from CRM [id number]. Topic: Clinical - Order For Equipment >> Feb 21, 2024  1:02 PM Franky GRADE wrote: Reason for CRM: Beth From [facility] is calling to Verify where DME orders for standard walker and wheel chair were sent to, as the patient has not gotten an update. Best call back number is [phone].

## 2024-02-21 NOTE — Telephone Encounter (Signed)
 Spoke to [employer] From [facility] and advise that order for walker is still being processed though insurance; once complete, they will contact patient for delivery and possible copayment. Beth verbalized understanding

## 2024-02-24 DIAGNOSIS — E039 Hypothyroidism, unspecified: Secondary | ICD-10-CM | POA: Diagnosis not present

## 2024-02-24 DIAGNOSIS — Z96642 Presence of left artificial hip joint: Secondary | ICD-10-CM | POA: Diagnosis not present

## 2024-02-24 DIAGNOSIS — Z604 Social exclusion and rejection: Secondary | ICD-10-CM | POA: Diagnosis not present

## 2024-02-24 DIAGNOSIS — I129 Hypertensive chronic kidney disease with stage 1 through stage 4 chronic kidney disease, or unspecified chronic kidney disease: Secondary | ICD-10-CM | POA: Diagnosis not present

## 2024-02-24 DIAGNOSIS — R296 Repeated falls: Secondary | ICD-10-CM | POA: Diagnosis not present

## 2024-02-24 DIAGNOSIS — N184 Chronic kidney disease, stage 4 (severe): Secondary | ICD-10-CM | POA: Diagnosis not present

## 2024-02-24 DIAGNOSIS — N179 Acute kidney failure, unspecified: Secondary | ICD-10-CM | POA: Diagnosis not present

## 2024-02-24 DIAGNOSIS — S72042D Displaced fracture of base of neck of left femur, subsequent encounter for closed fracture with routine healing: Secondary | ICD-10-CM | POA: Diagnosis not present

## 2024-02-24 DIAGNOSIS — Z9181 History of falling: Secondary | ICD-10-CM | POA: Diagnosis not present

## 2024-02-24 DIAGNOSIS — E785 Hyperlipidemia, unspecified: Secondary | ICD-10-CM | POA: Diagnosis not present

## 2024-02-25 ENCOUNTER — Telehealth: Payer: Self-pay

## 2024-02-25 DIAGNOSIS — R262 Difficulty in walking, not elsewhere classified: Secondary | ICD-10-CM | POA: Diagnosis not present

## 2024-02-25 DIAGNOSIS — Z9181 History of falling: Secondary | ICD-10-CM | POA: Diagnosis not present

## 2024-02-25 DIAGNOSIS — M84359A Stress fracture, hip, unspecified, initial encounter for fracture: Secondary | ICD-10-CM | POA: Diagnosis not present

## 2024-02-25 NOTE — Telephone Encounter (Signed)
 Copied from CRM [id number]. Topic: General - Other >> Feb 24, 2024  3:55 PM Aisha D wrote: Reason for CRM: Beth with [facility] wanted to inform Dr.Thompson that the pt's BP is 190/90 and the pt is asymptomatic. CB [phone].

## 2024-02-25 NOTE — Telephone Encounter (Signed)
 Forwarding message below

## 2024-02-26 DIAGNOSIS — R296 Repeated falls: Secondary | ICD-10-CM | POA: Diagnosis not present

## 2024-02-26 DIAGNOSIS — E785 Hyperlipidemia, unspecified: Secondary | ICD-10-CM | POA: Diagnosis not present

## 2024-02-26 DIAGNOSIS — E039 Hypothyroidism, unspecified: Secondary | ICD-10-CM | POA: Diagnosis not present

## 2024-02-26 DIAGNOSIS — I129 Hypertensive chronic kidney disease with stage 1 through stage 4 chronic kidney disease, or unspecified chronic kidney disease: Secondary | ICD-10-CM | POA: Diagnosis not present

## 2024-02-26 DIAGNOSIS — Z604 Social exclusion and rejection: Secondary | ICD-10-CM | POA: Diagnosis not present

## 2024-02-26 NOTE — Telephone Encounter (Signed)
 Spoke to beth from [facility] and advise that patient will need to follow up with cardiology or hypertensive clinic. Beth verbalized understanding. Pt also received 4 wheel walker with seat DME order.

## 2024-02-27 ENCOUNTER — Telehealth: Payer: Self-pay

## 2024-02-27 NOTE — Progress Notes (Signed)
   02/27/2024  Patient ID: Stacy Huang, female   DOB: 31-May-1921, 89 y.o.   MRN: [id number]  Stacy Huang, PTA with [facility], who has been going out a few days a week to work with Stacy Huang.  She did not have readings on hand but does endorse the patient's BP has been elevated; this is checked each time she goes out.  She did state patient's son endorsed giving her amlodipine  10mg  daily but did not mention losartan  or metoprolol .  Stacy Huang did write down patient's cardiologist phone number for her son to call to follow-up with in regard to her home BP.  Stacy Huang or a teammate will be going back to patient's home next week and plans to follow-up with me in regard to what medications patient has at home and is currently taking, as well as on home BP numbers.  Stacy Huang, PharmD, DPLA

## 2024-02-28 ENCOUNTER — Ambulatory Visit: Attending: Emergency Medicine | Admitting: Emergency Medicine

## 2024-02-28 ENCOUNTER — Other Ambulatory Visit: Payer: Self-pay

## 2024-02-28 ENCOUNTER — Telehealth: Payer: Self-pay | Admitting: *Deleted

## 2024-02-28 ENCOUNTER — Encounter: Payer: Self-pay | Admitting: Emergency Medicine

## 2024-02-28 VITALS — BP 136/74 | HR 72 | Ht 63.0 in | Wt 134.0 lb

## 2024-02-28 DIAGNOSIS — I10 Essential (primary) hypertension: Secondary | ICD-10-CM | POA: Diagnosis not present

## 2024-02-28 DIAGNOSIS — R7989 Other specified abnormal findings of blood chemistry: Secondary | ICD-10-CM

## 2024-02-28 DIAGNOSIS — I1A0 Resistant hypertension: Secondary | ICD-10-CM | POA: Diagnosis not present

## 2024-02-28 DIAGNOSIS — E039 Hypothyroidism, unspecified: Secondary | ICD-10-CM | POA: Diagnosis not present

## 2024-02-28 MED ORDER — METOPROLOL SUCCINATE ER 25 MG PO TB24: 25.0000 mg | ORAL_TABLET | Freq: Every day | ORAL | 2 refills | Status: AC

## 2024-02-28 MED ORDER — LEVOTHYROXINE SODIUM 50 MCG PO TABS
50.0000 ug | ORAL_TABLET | Freq: Every day | ORAL | 2 refills | Status: DC
Start: 2024-02-28 — End: 2024-04-28

## 2024-02-28 MED ORDER — AMLODIPINE BESYLATE 10 MG PO TABS
ORAL_TABLET | ORAL | 2 refills | Status: AC
Start: 2024-02-28 — End: ?

## 2024-02-28 MED ORDER — LOSARTAN POTASSIUM 50 MG PO TABS
50.0000 mg | ORAL_TABLET | Freq: Every day | ORAL | 2 refills | Status: DC
Start: 2024-02-28 — End: 2024-04-27

## 2024-02-28 NOTE — Progress Notes (Signed)
 Cardiology Office Note:    Date:  03/01/2024  ID:  Stacy Huang, DOB 1921-03-04, MRN [id number] PCP: Sebastian Beverley NOVAK, MD  [facility] Providers Cardiologist:  Gordy Bergamo, MD Cardiology APP:  Vannie Reche RAMAN, NP       Patient Profile:       Chief Complaint: Acute visit for hypertension History of Present Illness:  Stacy Huang is a 89 y.o. female with visit-pertinent history of hypertension, hyperlipidemia, CKD 3B, hypothyroidism  Echocardiogram April 2024 with LVEF 60 to 65%, grade 2 DD, trace MR, mild TR, mild pulmonary hypertension.  Was seen in the ED on 06/18/2023 with hypertension.  She had been out of her blood pressure medications for the past week her blood pressure at home routinely greater than 200.  Refills were provided.  She was seen by primary care on 06/25/2023 and referred to advanced hypertension clinic.  She is established with hypertension clinic on 08/15/2023 with Reche, NP.  She was only taking her medications once per day.  Her Isordil  was stopped and she was started on isosorbide  30 mg daily for easier daily dosing.  She was continued on amlodipine  10 mg daily and losartan  50 mg daily.  Due to her age, reality, and history of dizziness prefers slow careful changes in medication regimen.  On 08/20/2023 her Imdur  was increased to 60 mg daily.  She followed up with her PCP on 08/28/2023.  She had noted headaches on Imdur .  This was discontinued and she was initiated on carvedilol  3.125 mg twice daily.  She was last seen in office on 09/17/2023.  She presented with confusion regarding her medication regimen due to frequent changes in different prescription from various healthcare providers.  Blood pressure was mildly elevated during visit.  Her carvedilol  was switched to metoprolol  succinate 25 mg once daily to reduce pill burden and enhance compliance.  She was continued on losartan  50 mg daily and amlodipine  10 mg daily.  She was recently admitted to [facility] on 12/2023 for displaced hip fracture and underwent hemiarthroplasty on 7/27.  Per review of notes she had AKI with creatinine levels of 2.2 rising to 2.5 and 2.62 days postop.  And down to 2 prior to discharge.  Her losartan  was held at discharge.  Per chart review her antihypertensive medications at discharge were Isordil  60 mg 3 times daily, labetalol  100 mg 3 times daily, and amlodipine  10 mg daily.  She was then sent to rehab facility.  She then followed up with PCP on 9/5.  Her blood pressure was 140/80.  It looks like prior medications that were added on at recent hospital discharge were not addressed during this visit and never added to her MAR.  However losartan  was refilled?  Previous antihypertensives: Lisinopril-HCTZ - stopped due to AKI Hydralazine  - constipation   Discussed the use of AI scribe software for clinical note transcription with the patient, who gave verbal consent to proceed.  History of Present Illness Stacy Huang is a 89 year old female with hypertension who presents with high blood pressure. She was referred by her therapist for evaluation of high blood pressure.  Patient presents today with her son.  She has experienced elevated blood pressure for the last three days per her therapist. She was discharged from [facility] about a week ago after recovering from hip fracture surgery.  Patient tells me today that she is only taking 1 medication daily.  She is unsure of what medication she is currently taking and did not bring her medication into office visit today.  There is confusion about what medication she is currently taking.  Overall her blood pressure was fairly controlled at 136/74 today.  I will have her son go home after office visit and call in regards to what medication patient is currently taking.  Overall patient denies any chest pains, dyspnea, with apnea, PND, lightheadedness, dizziness, syncope,  presyncope.     Review of systems:  Please see the history of present illness. All other systems are reviewed and otherwise negative.      Studies Reviewed:        Carotid duplex 09/05/2023 Right Carotid: The extracranial vessels were near-normal with only minimal  wall                thickening or plaque.   Left Carotid: The extracranial vessels were near-normal with only minimal  wall               thickening or plaque.   Vertebrals:  Bilateral vertebral arteries demonstrate antegrade flow.  Subclavians: Normal flow hemodynamics were seen in bilateral subclavian               arteries.   Echocardiogram 09/25/2022 Normal LV systolic function with visual EF 60-65%. Left ventricle cavity  is normal in size. Normal left ventricular wall thickness. Normal global  wall motion. Doppler evidence of grade II (pseudonormal) diastolic  dysfunction, elevated LAP. Calculated EF 66%.  Left atrial cavity is mildly dilated at 37.6 ml/m^2.  Trace mitral regurgitation. Mild calcification of the mitral valve  annulus.  Structurally normal tricuspid valve.  Mild tricuspid regurgitation. Mild  pulmonary hypertension. RVSP measures 37 mmHg.  No prior available for comparison.  Risk Assessment/Calculations:             Physical Exam:   VS:  BP 136/74 (BP Location: Left Arm, Patient Position: Sitting, Cuff Size: Large)   Pulse 72   Ht 5' 3 (1.6 m)   Wt 134 lb (60.8 kg)   BMI 23.74 kg/m    Wt Readings from Last 3 Encounters:  02/28/24 134 lb (60.8 kg)  02/14/24 134 lb (60.8 kg)  12/05/23 134 lb 9.6 oz (61.1 kg)    GEN: Well nourished, well developed in no acute distress NECK: No JVD; No carotid bruits CARDIAC: RRR, no murmurs, rubs, gallops RESPIRATORY:  Clear to auscultation without rales, wheezing or rhonchi  ABDOMEN: Soft, non-tender, non-distended EXTREMITIES:  No edema; No acute deformity      Assessment and Plan:  Hypertension Blood pressure today is 136/74 Home health  nurse notes home blood pressures to be in the 190s Previously on amlodipine , losartan , metoprolol  succinate at last cardiology OV on 09/2023 - Admitted 12/2023 at [facility] for hip fracture.  Apparently discharged on Isordil  60 mg 3 times daily, labetalol  100 mg 3 times daily, and amlodipine  10 mg daily. Her ARB was held due to AKI at that time.  She was discharged to [facility] rehab center and returned home just 1 to 2 weeks ago and sounds like per patient report she was not sent home with any medications - Followed up with PCP on 9/5 does not seem that her prior hospital discharge medications were discussed.  Seems the thought was she was still taking prior cardiology prescribed medications.  PCP refilled losartan  - She comes in today as her home health nurse has reported several high blood pressure  readings over several days - Today patient tells me she is only taking 1 medication daily.  She does not know what medication she is currently taking - Plan to have the son to go to patient's home and call office to tell us  what medication she is currently taking.  - BMET today  Addendum Son has called in the office reporting patient is only taking losartan  50 mg daily - Plan to restart amlodipine  10 mg daily in a.m. and metoprolol  titrate 25 mg daily in p.m. - I also refilled her levothyroxine  as she was not taking - She will have quick follow-up in office where she will bring in all of her medications  Hypothyroidism TSH 13.26 on 11/2023 - Management per PCP - As noted above patient was not taking levothyroxine .  Refilled today, will need to be addressed by PCP      Dispo:  Return in about 1 month (around 03/29/2024).  Signed, Lum LITTIE Louis, NP

## 2024-02-28 NOTE — Telephone Encounter (Signed)
 Patient's son Stacy Huang called office to verify all medications patient is currently taking. Patient is to resume Metoprolol , Amlodipine , Levothyroxine , and to continue to take Losartan . Refills has been sent to pharmacy and patient's son understood all instructions moving forward, with no questions.

## 2024-02-28 NOTE — Patient Instructions (Addendum)
 Medication Instructions:  NO CHANGES AT THIS TIME.  Lab Work: BMET AND TSH TO BE DONE TODAY.  Testing/Procedures: NONE  Follow-Up: At [facility], you and your health needs are our priority.  As part of our continuing mission to provide you with exceptional heart care, our providers are all part of one team.  This team includes your primary Cardiologist (physician) and Advanced Practice Providers or APPs (Physician Assistants and Nurse Practitioners) who all work together to provide you with the care you need, when you need it.  Your next appointment:   March 31, 2024 at 10:55 AM  Provider:   MADISON FOUNTAIN, NP  We recommend signing up for the patient portal called MyChart.  Sign up information is provided on this After Visit Summary.  MyChart is used to connect with patients for Virtual Visits (Telemedicine).  Patients are able to view lab/test results, encounter notes, upcoming appointments, etc.  Non-urgent messages can be sent to your provider as well.   To learn more about what you can do with MyChart, go to [email or web address].   Other Instructions:  PLEASE BRING IN ALL MEDICATIONS TO NEXT APPOINTMENT.

## 2024-02-29 LAB — BASIC METABOLIC PANEL WITH GFR
BUN/Creatinine Ratio: 12 (ref 12–28)
BUN: 30 mg/dL (ref 10–36)
CO2: 19 mmol/L — ABNORMAL LOW (ref 20–29)
Calcium: 9.3 mg/dL (ref 8.7–10.3)
Chloride: 106 mmol/L (ref 96–106)
Creatinine, Ser: 2.43 mg/dL — ABNORMAL HIGH (ref 0.57–1.00)
Glucose: 95 mg/dL (ref 70–99)
Potassium: 5.2 mmol/L (ref 3.5–5.2)
Sodium: 142 mmol/L (ref 134–144)
eGFR: 17 mL/min/1.73 — ABNORMAL LOW (ref >59)

## 2024-02-29 LAB — TSH: TSH: 13.6 u[IU]/mL — ABNORMAL HIGH (ref 0.450–4.500)

## 2024-03-01 ENCOUNTER — Encounter: Payer: Self-pay | Admitting: Emergency Medicine

## 2024-03-01 ENCOUNTER — Ambulatory Visit: Payer: Self-pay | Admitting: Emergency Medicine

## 2024-03-07 DIAGNOSIS — Z604 Social exclusion and rejection: Secondary | ICD-10-CM | POA: Diagnosis not present

## 2024-03-07 DIAGNOSIS — S72042D Displaced fracture of base of neck of left femur, subsequent encounter for closed fracture with routine healing: Secondary | ICD-10-CM | POA: Diagnosis not present

## 2024-03-07 DIAGNOSIS — R296 Repeated falls: Secondary | ICD-10-CM | POA: Diagnosis not present

## 2024-03-07 DIAGNOSIS — N184 Chronic kidney disease, stage 4 (severe): Secondary | ICD-10-CM | POA: Diagnosis not present

## 2024-03-07 DIAGNOSIS — N179 Acute kidney failure, unspecified: Secondary | ICD-10-CM | POA: Diagnosis not present

## 2024-03-07 DIAGNOSIS — I129 Hypertensive chronic kidney disease with stage 1 through stage 4 chronic kidney disease, or unspecified chronic kidney disease: Secondary | ICD-10-CM | POA: Diagnosis not present

## 2024-03-07 DIAGNOSIS — Z9181 History of falling: Secondary | ICD-10-CM | POA: Diagnosis not present

## 2024-03-07 DIAGNOSIS — E785 Hyperlipidemia, unspecified: Secondary | ICD-10-CM | POA: Diagnosis not present

## 2024-03-07 DIAGNOSIS — Z96642 Presence of left artificial hip joint: Secondary | ICD-10-CM | POA: Diagnosis not present

## 2024-03-07 DIAGNOSIS — E039 Hypothyroidism, unspecified: Secondary | ICD-10-CM | POA: Diagnosis not present

## 2024-03-09 ENCOUNTER — Telehealth: Payer: Self-pay

## 2024-03-09 NOTE — Telephone Encounter (Signed)
 Copied from CRM [id number]. Topic: Clinical - Home Health Verbal Orders >> Mar 09, 2024  2:40 PM Deaijah H wrote: Caller/Agency: Cicilia w/ [facility] PT Callback Number: [phone] Service Requested: Physical Therapy Frequency: Extend PT for this wk 2x a week and once a wk for 3wks Any new concerns about the patient? No

## 2024-03-09 NOTE — Telephone Encounter (Signed)
 Called Stacy Huang from [facility] and provided verbal orders for PT.

## 2024-03-10 DIAGNOSIS — E785 Hyperlipidemia, unspecified: Secondary | ICD-10-CM | POA: Diagnosis not present

## 2024-03-10 DIAGNOSIS — Z604 Social exclusion and rejection: Secondary | ICD-10-CM | POA: Diagnosis not present

## 2024-03-10 DIAGNOSIS — E039 Hypothyroidism, unspecified: Secondary | ICD-10-CM | POA: Diagnosis not present

## 2024-03-10 DIAGNOSIS — Z9181 History of falling: Secondary | ICD-10-CM | POA: Diagnosis not present

## 2024-03-10 DIAGNOSIS — Z96642 Presence of left artificial hip joint: Secondary | ICD-10-CM | POA: Diagnosis not present

## 2024-03-10 DIAGNOSIS — R296 Repeated falls: Secondary | ICD-10-CM | POA: Diagnosis not present

## 2024-03-10 DIAGNOSIS — N184 Chronic kidney disease, stage 4 (severe): Secondary | ICD-10-CM | POA: Diagnosis not present

## 2024-03-10 DIAGNOSIS — S72042D Displaced fracture of base of neck of left femur, subsequent encounter for closed fracture with routine healing: Secondary | ICD-10-CM | POA: Diagnosis not present

## 2024-03-10 DIAGNOSIS — N179 Acute kidney failure, unspecified: Secondary | ICD-10-CM | POA: Diagnosis not present

## 2024-03-10 DIAGNOSIS — I129 Hypertensive chronic kidney disease with stage 1 through stage 4 chronic kidney disease, or unspecified chronic kidney disease: Secondary | ICD-10-CM | POA: Diagnosis not present

## 2024-03-13 DIAGNOSIS — E785 Hyperlipidemia, unspecified: Secondary | ICD-10-CM | POA: Diagnosis not present

## 2024-03-13 DIAGNOSIS — E039 Hypothyroidism, unspecified: Secondary | ICD-10-CM | POA: Diagnosis not present

## 2024-03-13 DIAGNOSIS — I129 Hypertensive chronic kidney disease with stage 1 through stage 4 chronic kidney disease, or unspecified chronic kidney disease: Secondary | ICD-10-CM | POA: Diagnosis not present

## 2024-03-13 DIAGNOSIS — N179 Acute kidney failure, unspecified: Secondary | ICD-10-CM | POA: Diagnosis not present

## 2024-03-13 DIAGNOSIS — R296 Repeated falls: Secondary | ICD-10-CM | POA: Diagnosis not present

## 2024-03-13 DIAGNOSIS — S72042D Displaced fracture of base of neck of left femur, subsequent encounter for closed fracture with routine healing: Secondary | ICD-10-CM | POA: Diagnosis not present

## 2024-03-13 DIAGNOSIS — Z96642 Presence of left artificial hip joint: Secondary | ICD-10-CM | POA: Diagnosis not present

## 2024-03-13 DIAGNOSIS — Z9181 History of falling: Secondary | ICD-10-CM | POA: Diagnosis not present

## 2024-03-13 DIAGNOSIS — N184 Chronic kidney disease, stage 4 (severe): Secondary | ICD-10-CM | POA: Diagnosis not present

## 2024-03-30 DIAGNOSIS — S72042A Displaced fracture of base of neck of left femur, initial encounter for closed fracture: Secondary | ICD-10-CM | POA: Diagnosis not present

## 2024-03-31 ENCOUNTER — Encounter: Payer: Self-pay | Admitting: Emergency Medicine

## 2024-03-31 ENCOUNTER — Ambulatory Visit: Attending: Emergency Medicine | Admitting: Emergency Medicine

## 2024-03-31 VITALS — BP 136/72 | HR 78 | Ht 63.0 in | Wt 134.0 lb

## 2024-03-31 DIAGNOSIS — I10 Essential (primary) hypertension: Secondary | ICD-10-CM | POA: Diagnosis not present

## 2024-03-31 DIAGNOSIS — E039 Hypothyroidism, unspecified: Secondary | ICD-10-CM

## 2024-03-31 DIAGNOSIS — N184 Chronic kidney disease, stage 4 (severe): Secondary | ICD-10-CM

## 2024-03-31 NOTE — Progress Notes (Signed)
 Cardiology Office Note:    Date:  03/31/2024  ID:  Stacy Huang, DOB 04-01-1921, MRN [id number] PCP: Sebastian Beverley NOVAK, MD  [facility] Providers Cardiologist:  Gordy Bergamo, MD Cardiology APP:  Vannie Reche RAMAN, NP       Patient Profile:       Chief Complaint: 1 month follow-up for hypertension History of Present Illness:  Stacy Huang is a 89 y.o. female with visit-pertinent history of hypertension, hyperlipidemia, CKD 3B, hypothyroidism   Echocardiogram April 2024 with LVEF 60 to 65%, grade 2 DD, trace MR, mild TR, mild pulmonary hypertension.  Was seen in the ED on 06/18/2023 with hypertension.  She had been out of her blood pressure medications for the past week her blood pressure at home routinely greater than 200.  Refills were provided.  She was seen by primary care on 06/25/2023 and referred to advanced hypertension clinic.  She is established with hypertension clinic on 08/15/2023 with Reche, NP.  She was only taking her medications once per day.  Her Isordil  was stopped and she was started on isosorbide  30 mg daily for easier daily dosing.  She was continued on amlodipine  10 mg daily and losartan  50 mg daily.  Due to her age, reality, and history of dizziness prefers slow careful changes in medication regimen.  On 08/20/2023 her Imdur  was increased to 60 mg daily.   She followed up with her PCP on 08/28/2023.  She had noted headaches on Imdur .  This was discontinued and she was initiated on carvedilol  3.125 mg twice daily.   Seen in office on 09/17/2023.  She presented with confusion regarding her medication regimen due to frequent changes in different prescription from various healthcare providers.  Blood pressure was mildly elevated during visit.  Her carvedilol  was switched to metoprolol  succinate 25 mg once daily to reduce pill burden and enhance compliance.  She was continued on losartan  50 mg daily and amlodipine  10 mg daily.   She was admitted to [facility] on 12/2023 for displaced hip fracture and underwent hemiarthroplasty on 7/27.  Per review of notes she had AKI with creatinine levels of 2.2 rising to 2.5 and 2.62 days postop.  And down to 2 prior to discharge.  Her losartan  was held at discharge.  Per chart review her antihypertensive medications at discharge were Isordil  60 mg 3 times daily, labetalol  100 mg 3 times daily, and amlodipine  10 mg daily.  She was then sent to rehab facility.   She then followed up with PCP on 9/5.  Her blood pressure was 140/80.  It looks like prior medications that were added on at recent hospital discharge were not addressed during visit and never added to her MAR.  However losartan  was refilled  She was last seen in clinic on 02/28/2024.  She was referred by her therapist for evaluation of high blood pressure.  She was discharged from [facility] about a week prior to visit after recovering from hip fracture surgery.  She was reported to only be taking 1 medication daily and was unsure what medication she was currently taking and did not bring her medications into office visit.  We contacted her son who had reported patient was only taking losartan  50 mg daily.  She was restarted on amlodipine  10 mg daily in the a.m. and metoprolol  tartrate 25 mg daily in the p.m.  Her levothyroxine  was also refilled as her most recent TSH  was 13.26 on 11/2023.   Discussed the use of AI scribe software for clinical note transcription with the patient, who gave verbal consent to proceed.  History of Present Illness Stacy Huang is a 89 year old female with hypertension who presents for follow-up of her blood pressure management.  Today patient is without acute cardiac concerns.  She now fully understands her medication regimen and has been compliant per her son.  Per son physical therapist comes out twice a week and has been taking her blood pressure and it seems to have been much  improved at home.  She will have occasional headaches in the mornings.  She denies any chest pains, dyspnea, orthopnea, PND, LEE, palpitations.  She feels 'pretty good' overall and denies any significant issues with her current health status.   Review of systems:  Please see the history of present illness. All other systems are reviewed and otherwise negative.      Studies Reviewed:        Carotid duplex 09/05/2023 Right Carotid: The extracranial vessels were near-normal with only minimal  wall                thickening or plaque.   Left Carotid: The extracranial vessels were near-normal with only minimal  wall               thickening or plaque.   Vertebrals:  Bilateral vertebral arteries demonstrate antegrade flow.  Subclavians: Normal flow hemodynamics were seen in bilateral subclavian               arteries.    Echocardiogram 09/25/2022 Normal LV systolic function with visual EF 60-65%. Left ventricle cavity  is normal in size. Normal left ventricular wall thickness. Normal global  wall motion. Doppler evidence of grade II (pseudonormal) diastolic  dysfunction, elevated LAP. Calculated EF 66%.  Left atrial cavity is mildly dilated at 37.6 ml/m^2.  Trace mitral regurgitation. Mild calcification of the mitral valve  annulus.  Structurally normal tricuspid valve.  Mild tricuspid regurgitation. Mild  pulmonary hypertension. RVSP measures 37 mmHg.  No prior available for comparison.   Risk Assessment/Calculations:              Physical Exam:   VS:  BP 136/72 (BP Location: Right Arm, Patient Position: Sitting, Cuff Size: Normal)   Pulse 78   Ht 5' 3 (1.6 m)   Wt 134 lb (60.8 kg)   BMI 23.74 kg/m    Wt Readings from Last 3 Encounters:  03/31/24 134 lb (60.8 kg)  02/28/24 134 lb (60.8 kg)  02/14/24 134 lb (60.8 kg)    GEN: Frail, well developed in no acute distress NECK: No JVD; No carotid bruits CARDIAC: RRR, no murmurs, rubs, gallops RESPIRATORY:  Clear to  auscultation without rales, wheezing or rhonchi  ABDOMEN: Soft, non-tender, non-distended EXTREMITIES:  No edema; No acute deformity      Assessment and Plan:  Hypertension Blood pressure today is 136/72 and well-controlled There has been some confusion regarding medication regimen as further noted in HPI - Home PT has noticed improved blood pressure measurement since resuming current medication regimen  - Today patient and son understand patient's current medication regimen and she remains adherent - Continue amlodipine  10 mg daily, losartan  50 mg daily, metoprolol  succinate 25 mg daily   Hypothyroidism TSH 13.6 on 02/2024 Was not taking levothyroxine  for unknown amount of time.  I did refill this on 02/2024 and she has remained adherent since that time.  I  have advised her to follow-up with PCP for further management - Managed on levothyroxine  50 mcg day  CKD stage IV Creatinine 2.43 and GFR 17 on 02/2024 - Stable.  Will continue to clinically monitor - Avoid nephrotoxic drugs     Dispo:  Return in about 6 months (around 09/29/2024).  Signed, Lum LITTIE Louis, NP

## 2024-03-31 NOTE — Patient Instructions (Addendum)
 Medication Instructions:  NO CHANGES  Lab Work: NONE TO BE DONE TODAY.  Testing/Procedures: NONE  Follow-Up: At [facility], you and your health needs are our priority.  As part of our continuing mission to provide you with exceptional heart care, our providers are all part of one team.  This team includes your primary Cardiologist (physician) and Advanced Practice Providers or APPs (Physician Assistants and Nurse Practitioners) who all work together to provide you with the care you need, when you need it.  Your next appointment:   6 MONTHS  Provider:   Gordy Bergamo, MD (ONLY)  We recommend signing up for the patient portal called MyChart.  Sign up information is provided on this After Visit Summary.  MyChart is used to connect with patients for Virtual Visits (Telemedicine).  Patients are able to view lab/test results, encounter notes, upcoming appointments, etc.  Non-urgent messages can be sent to your provider as well.   To learn more about what you can do with MyChart, go to [email or web address].

## 2024-04-15 ENCOUNTER — Ambulatory Visit: Payer: Self-pay

## 2024-04-15 NOTE — Telephone Encounter (Signed)
 This RN contacted Beth to confirm she does not mean PO, she confirmed she was requesting to use topical (CKD IV). Also reports pts legs are edemenous. She will call with patient's son to schedule an appointment.  Copied from CRM [id number]. Topic: Clinical - Medication Question >> Apr 15, 2024  4:18 PM Alfonso HERO wrote: Reason for CRM: Beth from [facility] called to ask if the patient can take OTC Diclosenac (Valtaran) for joint pain. She asked for a call back at [phone].

## 2024-04-20 NOTE — Telephone Encounter (Signed)
 Called (Beth from [facility]) and left VM that pt needs a OV and OTC Voltaren  gel for joint pain was approved by Dr. Sebastian.

## 2024-04-21 ENCOUNTER — Telehealth: Payer: Self-pay | Admitting: Family Medicine

## 2024-04-21 NOTE — Telephone Encounter (Signed)
 Patient dropped off document Home Health Certificate (Order ID [id number]), to be filled out by provider. Patient requested to send it back via Fax within 7-days. Document is located in providers tray at front office.Please advise at [phone]  Home health order came through fax. I put in the dr box

## 2024-04-22 NOTE — Telephone Encounter (Signed)
 Home Health order was signed and faxed with confirmation on 04/22/2024; charge sheet was placed in [employer] office.

## 2024-04-27 ENCOUNTER — Encounter: Payer: Self-pay | Admitting: Nurse Practitioner

## 2024-04-27 ENCOUNTER — Ambulatory Visit: Admitting: Nurse Practitioner

## 2024-04-27 VITALS — BP 190/90 | HR 88 | Temp 97.5°F | Ht 63.0 in

## 2024-04-27 DIAGNOSIS — R051 Acute cough: Secondary | ICD-10-CM

## 2024-04-27 DIAGNOSIS — E039 Hypothyroidism, unspecified: Secondary | ICD-10-CM | POA: Diagnosis not present

## 2024-04-27 DIAGNOSIS — I10 Essential (primary) hypertension: Secondary | ICD-10-CM

## 2024-04-27 DIAGNOSIS — I1A0 Resistant hypertension: Secondary | ICD-10-CM | POA: Diagnosis not present

## 2024-04-27 LAB — TSH: TSH: 8.32 u[IU]/mL — ABNORMAL HIGH (ref 0.35–5.50)

## 2024-04-27 LAB — T4, FREE: Free T4: 0.91 ng/dL (ref 0.60–1.60)

## 2024-04-27 MED ORDER — LOSARTAN POTASSIUM 50 MG PO TABS: 50.0000 mg | ORAL_TABLET | Freq: Every day | ORAL | 2 refills | Status: DC

## 2024-04-27 NOTE — Assessment & Plan Note (Signed)
 She has been taking levothyroxine  regularly. Will recheck TSH and free T4 today.

## 2024-04-27 NOTE — Progress Notes (Signed)
 Acute Office Visit  Subjective:     Patient ID: Stacy Huang, female    DOB: 06/18/1920, 89 y.o.   MRN: [id number]  Chief Complaint  Patient presents with   Hypertension    BP Elevated at PT    HPI Discussed the use of AI scribe software for clinical note transcription with the patient, who gave verbal consent to proceed.  History of Present Illness   Stacy Huang is a 89 year old female with hypertension who presents with elevated blood pressure. She is accompanied by her son.  Elevated blood pressure was noted during a recent physical therapy session. She does not regularly monitor her blood pressure at home. Her current medications include amlodipine  10 mg, metoprolol  25 mg, meclizine , and levothyroxine . She has not been taking losartan  recently.  She experiences a mild headache. Leg swelling has been present for a while, occasionally causing mild pain. No issues with urination. She denies chest pain and shortness of breath.   She has a cough and took an orange-colored liquid cough medicine and some cough pills, with the last dose being yesterday. She also has a stuffy and runny nose but denies coughing up sputum. No fever, chills, or sweats. No one else in her household is sick.     ROS See pertinent positives and negatives per HPI.     Objective:    BP (!) 190/90 (BP Location: Right Arm, Cuff Size: Normal)   Pulse 88   Temp (!) 97.5 F (36.4 C)   Ht 5' 3 (1.6 m)   SpO2 93%   BMI 23.74 kg/m    Physical Exam Vitals and nursing note reviewed.  Constitutional:      General: She is not in acute distress.    Appearance: Normal appearance.  HENT:     Head: Normocephalic.  Eyes:     Conjunctiva/sclera: Conjunctivae normal.  Cardiovascular:     Rate and Rhythm: Normal rate and regular rhythm.     Pulses: Normal pulses.     Heart sounds: Normal heart sounds.  Pulmonary:     Effort: Pulmonary effort is normal.     Breath sounds: Normal breath sounds.   Musculoskeletal:     Cervical back: Normal range of motion.  Skin:    General: Skin is warm.  Neurological:     General: No focal deficit present.     Mental Status: She is alert and oriented to person, place, and time.  Psychiatric:        Mood and Affect: Mood normal.        Behavior: Behavior normal.        Thought Content: Thought content normal.        Judgment: Judgment normal.       Assessment & Plan:   Problem List Items Addressed This Visit       Cardiovascular and Mediastinum   Resistant hypertension - Primary   Chronic, not controlled. Blood pressure is elevated at 190/90 mmHg, likely due to several missed losartan  doses and cough medicine use. Restart losartan  50mg  one tablet daily. A refill for losartan  has been sent to the pharmacy. Continue amlodipine  10mg  daily and metoprolol  25mg  daily. She is advised to use Coricidin HBP for cough and cold symptoms. Follow-up in two weeks to reassess blood pressure. Check BMP today.       Relevant Medications   losartan  (COZAAR ) 50 MG tablet   Other Relevant Orders   Basic metabolic panel with GFR  Endocrine   Hypothyroidism   She has been taking levothyroxine  regularly. Will recheck TSH and free T4 today.      Relevant Orders   TSH   T4, free   Other Visit Diagnoses       Acute cough       Dry cough x1 week. Advised to use Coricidin HBP for cough and cold symptoms and encouraged to use cough drops for throat dryness.       Meds ordered this encounter  Medications   losartan  (COZAAR ) 50 MG tablet    Sig: Take 1 tablet (50 mg total) by mouth daily.    Dispense:  90 tablet    Refill:  2   Return in about 2 weeks (around 05/11/2024) for HTN with PCP.  Stacy DELENA Harada, NP

## 2024-04-27 NOTE — Assessment & Plan Note (Signed)
 Chronic, not controlled. Blood pressure is elevated at 190/90 mmHg, likely due to several missed losartan  doses and cough medicine use. Restart losartan  50mg  one tablet daily. A refill for losartan  has been sent to the pharmacy. Continue amlodipine  10mg  daily and metoprolol  25mg  daily. She is advised to use Coricidin HBP for cough and cold symptoms. Follow-up in two weeks to reassess blood pressure. Check BMP today.

## 2024-04-27 NOTE — Patient Instructions (Signed)
 It was great to see you!  We are checking your labs today and will let you know the results via mychart/phone.   Restart the losartan  1 tablet daily. You should be on 4 pills everyday and then 1 pill as needed: see the attached page   You can take coricidn hbp as needed for cough/cold symptoms or use a cough drop   Let's follow-up in 2 weeks, sooner if you have concerns.  If a referral was placed today, you will be contacted for an appointment. Please note that routine referrals can sometimes take up to 3-4 weeks to process. Please call our office if you haven't heard anything after this time frame.  Take care,  Tinnie Harada, NP

## 2024-04-28 ENCOUNTER — Ambulatory Visit: Payer: Self-pay | Admitting: Nurse Practitioner

## 2024-04-28 DIAGNOSIS — E039 Hypothyroidism, unspecified: Secondary | ICD-10-CM

## 2024-04-28 LAB — BASIC METABOLIC PANEL WITH GFR
BUN: 42 mg/dL — ABNORMAL HIGH (ref 6–23)
CO2: 18 meq/L — ABNORMAL LOW (ref 19–32)
Calcium: 8.8 mg/dL (ref 8.4–10.5)
Chloride: 110 meq/L (ref 96–112)
Creatinine, Ser: 4.02 mg/dL — ABNORMAL HIGH (ref 0.40–1.20)
GFR: 8.66 mL/min — CL (ref >60.00)
Glucose, Bld: 88 mg/dL (ref 70–99)
Potassium: 3.8 meq/L (ref 3.5–5.1)
Sodium: 144 meq/L (ref 135–145)

## 2024-04-28 MED ORDER — LEVOTHYROXINE SODIUM 75 MCG PO TABS: 75.0000 ug | ORAL_TABLET | Freq: Every day | ORAL | 3 refills | Status: DC

## 2024-05-05 ENCOUNTER — Telehealth: Payer: Self-pay | Admitting: *Deleted

## 2024-05-05 NOTE — Transitions of Care (Post Inpatient/ED Visit) (Signed)
   05/05/2024  Name: Stacy Huang MRN: [id number] DOB: [birth year]  Today's TOC FU Call Status: Today's TOC FU Call Status:: Unsuccessful Call (1st Attempt) Unsuccessful Call (1st Attempt) Date: 05/05/24  Attempted to reach the patient regarding the most recent Inpatient/ED visit.  Follow Up Plan: Additional outreach attempts will be made to reach the patient to complete the Transitions of Care (Post Inpatient/ED visit) call.   Cathlean Headland BSN RN [facility] Health Care Management Coordinator [email or web address] Direct Dial: [phone]  Fax: [phone] Website: [email or web address]

## 2024-05-06 ENCOUNTER — Telehealth: Payer: Self-pay | Admitting: *Deleted

## 2024-05-06 NOTE — Transitions of Care (Post Inpatient/ED Visit) (Signed)
   05/06/2024  Name: MELEANE SELINGER MRN: [id number] DOB: February 16, 1921  Today's TOC FU Call Status: Today's TOC FU Call Status:: Unsuccessful Call (2nd Attempt) Unsuccessful Call (2nd Attempt) Date: 05/06/24  Attempted to reach the patient regarding the most recent Inpatient/ED visit.  Follow Up Plan: Additional outreach attempts will be made to reach the patient to complete the Transitions of Care (Post Inpatient/ED visit) call.   Cathlean Headland BSN RN [facility] Health Care Management Coordinator [email or web address] Direct Dial: [phone]  Fax: [phone] Website: [email or web address]

## 2024-05-13 ENCOUNTER — Ambulatory Visit: Admitting: Family Medicine

## 2024-05-13 ENCOUNTER — Encounter: Payer: Self-pay | Admitting: Family Medicine

## 2024-05-13 ENCOUNTER — Ambulatory Visit: Admitting: Internal Medicine

## 2024-05-13 VITALS — BP 110/72 | HR 72 | Temp 99.1°F | Ht 63.0 in | Wt 134.0 lb

## 2024-05-13 DIAGNOSIS — E039 Hypothyroidism, unspecified: Secondary | ICD-10-CM

## 2024-05-13 DIAGNOSIS — Z7409 Other reduced mobility: Secondary | ICD-10-CM

## 2024-05-13 DIAGNOSIS — N185 Chronic kidney disease, stage 5: Secondary | ICD-10-CM

## 2024-05-13 DIAGNOSIS — J129 Viral pneumonia, unspecified: Secondary | ICD-10-CM

## 2024-05-13 DIAGNOSIS — I5033 Acute on chronic diastolic (congestive) heart failure: Secondary | ICD-10-CM | POA: Diagnosis not present

## 2024-05-13 DIAGNOSIS — R54 Age-related physical debility: Secondary | ICD-10-CM

## 2024-05-13 DIAGNOSIS — I87319 Chronic venous hypertension (idiopathic) with ulcer of unspecified lower extremity: Secondary | ICD-10-CM | POA: Diagnosis not present

## 2024-05-13 MED ORDER — BENZONATATE 100 MG PO CAPS: 100.0000 mg | ORAL_CAPSULE | Freq: Two times a day (BID) | ORAL | 0 refills | Status: DC | PRN

## 2024-05-13 MED ORDER — CEFUROXIME AXETIL 250 MG PO TABS: 250.0000 mg | ORAL_TABLET | Freq: Every day | ORAL | 0 refills | Status: AC

## 2024-05-13 NOTE — Patient Instructions (Signed)
 It was very nice to see you today!  VISIT SUMMARY: You had a follow-up visit after your recent hospitalization for viral pneumonia and heart failure. Your breathing has improved, but you still experience some weakness and occasional shortness of breath. Your blood pressure is well-controlled, and we discussed care for your leg wounds.  YOUR PLAN: ACUTE HEART FAILURE WITH PRESERVED EJECTION FRACTION AND VOLUME OVERLOAD: You were recently hospitalized for heart failure, likely worsened by viral pneumonia. Your symptoms have improved but are still present. -A chest x-ray has been ordered to check your lungs. -A BNP test has been ordered to evaluate your heart function. -You have been referred to a cardiologist for follow-up.  VIRAL PNEUMONIA: You have a persistent cough and shortness of breath from viral pneumonia, which have improved but are still present. -You have been prescribed benzonatate pearls to help manage your cough.  VENOUS STASIS ULCERS AND POSSIBLE WOUND INFECTION OF BILATERAL LOWER LEGS: You have leg ulcers that may be infected, causing pain and swelling. Your heart failure has contributed to leg swelling and skin breakdown. -Home health services have been ordered for wound care. -You have been prescribed antibiotics to prevent infection. -An x-ray of your leg has been ordered to check for inflammation. -A complete metabolic panel, liver function tests, and blood counts have been ordered. -Avoid using baby powder on your legs. Use compression stockings instead.  CHRONIC KIDNEY DISEASE STAGE 4: You have stage 4 chronic kidney disease, and your kidney function worsened during your recent hospitalization but has since improved. -A complete metabolic panel has been ordered to check your kidney function.  HYPERTENSION: Your blood pressure is well-controlled at 110/72 mmHg. -Continue with your current blood pressure medications.  No follow-ups on file.   Take care, Arvella Hummer,  MD, MS   PLEASE NOTE:  If you had any lab tests, please let us  know if you have not heard back within a few days. You may see your results on mychart before we have a chance to review them but we will give you a call once they are reviewed by us .   If we ordered any referrals today, please let us  know if you have not heard from their office within the next week.   If you had any urgent prescriptions sent in today, please check with the pharmacy within an hour of our visit to make sure the prescription was transmitted appropriately.   Please try these tips to maintain a healthy lifestyle:  Eat at least 3 REAL meals and 1-2 snacks per day.  Aim for no more than 5 hours between eating.  If you eat breakfast, please do so within one hour of getting up.   Each meal should contain half fruits/vegetables, one quarter protein, and one quarter carbs (no bigger than a computer mouse)  Cut down on sweet beverages. This includes juice, soda, and sweet tea.   Drink at least 1 glass of water with each meal and aim for at least 8 glasses per day  Exercise at least 150 minutes every week.

## 2024-05-13 NOTE — Progress Notes (Signed)
 Assessment & Plan   Assessment/Plan:   Assessment and Plan Assessment & Plan Acute heart failure with preserved ejection fraction and volume overload Recent hospitalization for acute heart failure with preserved ejection fraction, likely exacerbated by viral pneumonia. Symptoms include shortness of breath and cough, which have improved but persist. Oxygen saturation is 92%.  She is very frail and an elderly with difficulty moving due out of her wheelchair. - Ordered chest x-ray to assess pulmonary status - Ordered BNP to evaluate heart function - Referred to cardiologist for follow-up  Hypothyroidism - Recheck thyroid  levels  Viral pneumonia Symptoms include persistent cough and shortness of breath, which have improved but are still present. - Prescribed benzonatate  pearls for cough management  Venous stasis ulcers and possible wound infection of bilateral lower legs Venous stasis ulcers on bilateral lower legs, with possible infection indicated by warmth and worsening pain. Fluid overload from heart failure contributed to leg swelling and skin breakdown. No current signs of infection, but antibiotics are considered due to potential risk. - Ordered home health services for wound care - Prescribed antibiotics to cover potential infection - Ordered x-ray of the leg to assess for signs of inflammation - Ordered complete metabolic panel, liver function tests, and blood counts - Advised against using baby powder; recommended compression with stockings  Chronic kidney disease stage 5 Chronic kidney disease stage 5, with recent hospitalization for acute worsening of renal function. Renal function improved following diuresis with Lasix . - Ordered complete metabolic panel to assess renal function  Hypertension Well controlled with current blood pressure reading of 110/72 mmHg. - Continue current antihypertensive regimen        There are no discontinued medications.  Return in  about 1 week (around 05/20/2024) for wound.        Subjective:   Encounter date: 05/13/2024  Stacy Huang is a 89 y.o. female who has Other fatigue; Stage 3b chronic kidney disease (HCC); History of deep vein thrombosis (DVT) of lower extremity; Hypothyroidism; Hyperlipidemia; Compression fracture of L1 vertebra (HCC); Dizziness and giddiness; Hearing loss of both ears due to cerumen impaction; Resistant hypertension; Orthostatic dizziness; Vertigo; Frail elderly; Difficulty transferring from wheelchair to chair; Glaucoma of right eye; Nocturnal leg cramps; Stage 4 chronic kidney disease (HCC); Chronic nonintractable headache; Postmenopausal estrogen deficiency; Chronic pain of right knee; Drug-induced constipation; Sarcopenia; and Personal history of fall on their problem list..   She  has a past medical history of Acute hypoxemic respiratory failure due to COVID-19 [facility]) (02/27/2020), Essential hypertension (04/21/2021), Hypertension, Hypertensive urgency (02/27/2020), Shoulder fracture, right, and Thyroid  disease..   She presents with chief complaint of No chief complaint on file. .   Discussed the use of AI scribe software for clinical note transcription with the patient, who gave verbal consent to proceed.  History of Present Illness           Hypertension - Managed with Amlodipine  10 mg daily, Metoprolol  succinate 25 mg daily, and Losartan  50 mg daily (reportedly has not recently been taking Losartan , possibly contributing to her blood pressure elevation) - Does not regularly monitor her blood pressure at home.  - Recently has had a cough associated with nasal congestion and rhinorrhea, which she was managing with OTC medication, possibly contributing to blood pressure elevation.  - Two weeks ago when seen by another provider for this, she endorsed experiencing a mild headache and leg swelling; leg swelling has been chronic. No chest pain or shortness of breath noted.    ROS  No past surgical history on file.  Current Outpatient Medications on File Prior to Visit  Medication Sig Dispense Refill   amLODipine  (NORVASC ) 10 MG tablet TAKE 1 TABLET(10 MG) BY MOUTH EVERY MORNING 90 tablet 2   levothyroxine  (SYNTHROID ) 75 MCG tablet Take 1 tablet (75 mcg total) by mouth daily. 90 tablet 3   losartan  (COZAAR ) 50 MG tablet Take 1 tablet (50 mg total) by mouth daily. 90 tablet 2   meclizine  (ANTIVERT ) 12.5 MG tablet Take 12.5 mg by mouth 3 (three) times daily as needed.     metoprolol  succinate (TOPROL  XL) 25 MG 24 hr tablet Take 1 tablet (25 mg total) by mouth daily. 90 tablet 2   polyethylene glycol powder (GLYCOLAX /MIRALAX ) 17 GM/SCOOP powder Take 17 g by mouth daily. (Patient taking differently: Take 17 g by mouth as needed for severe constipation.) 500 g 0   No current facility-administered medications on file prior to visit.    Family History  Problem Relation Age of Onset   Alzheimer's disease Mother    Hypertension Father    Other Sister        Died at birth   Cancer Brother        spinal   Heart attack Brother     Social History   Socioeconomic History   Marital status: Widowed    Spouse name: Not on file   Number of children: 1   Years of education: Not on file   Highest education level: Not on file  Occupational History   Not on file  Tobacco Use   Smoking status: Never    Passive exposure: Never   Smokeless tobacco: Never  Vaping Use   Vaping status: Never Used  Substance and Sexual Activity   Alcohol use: Never   Drug use: Never   Sexual activity: Not on file  Other Topics Concern   Not on file  Social History Narrative   Not on file   Social Drivers of Health   Financial Resource Strain: Low Risk  (10/19/2022)   Overall Financial Resource Strain (CARDIA)    Difficulty of Paying Living Expenses: Not hard at all  Food Insecurity: No Food Insecurity (10/19/2022)   Hunger Vital Sign    Worried About Running Out of Food in  the Last Year: Never true    Ran Out of Food in the Last Year: Never true  Transportation Needs: No Transportation Needs (10/19/2022)   PRAPARE - Administrator, Civil Service (Medical): No    Lack of Transportation (Non-Medical): No  Physical Activity: Inactive (10/19/2022)   Exercise Vital Sign    Days of Exercise per Week: 0 days    Minutes of Exercise per Session: 0 min  Stress: No Stress Concern Present (10/19/2022)   [employer] of Occupational Health - Occupational Stress Questionnaire    Feeling of Stress : Not at all  Social Connections: Moderately Isolated (08/07/2022)   Social Connection and Isolation Panel    Frequency of Communication with Friends and Family: Twice a week    Frequency of Social Gatherings with Friends and Family: Once a week    Attends Religious Services: 1 to 4 times per year    Active Member of [employer] or Organizations: No    Attends Banker Meetings: Never    Marital Status: Widowed  Intimate Partner Violence: Not At Risk (08/07/2022)   Humiliation, Afraid, Rape, and Kick questionnaire    Fear of Current or Ex-Partner: No  Emotionally Abused: No    Physically Abused: No    Sexually Abused: No                                                                                                  Objective:  Physical Exam: BP 110/72   Pulse 72   Temp 99.1 F (37.3 C) (Temporal)   Ht 5' 3 (1.6 m)   Wt 134 lb (60.8 kg)   SpO2 92%   BMI 23.74 kg/m     Physical Exam Constitutional:      General: She is not in acute distress.    Appearance: Normal appearance. She is not ill-appearing or toxic-appearing.     Comments: Patient is wheelchair-bound  HENT:     Head: Normocephalic and atraumatic.     Nose: Nose normal. No congestion.  Eyes:     General: No scleral icterus.    Extraocular Movements: Extraocular movements intact.  Cardiovascular:     Rate and Rhythm: Normal rate and regular rhythm.  Pulmonary:     Effort:  Pulmonary effort is normal. No respiratory distress.     Breath sounds: Rhonchi present.  Abdominal:     General: Abdomen is flat. Bowel sounds are normal.     Palpations: Abdomen is soft.  Musculoskeletal:        General: Normal range of motion.  Lymphadenopathy:     Cervical: No cervical adenopathy.  Skin:    General: Skin is warm and dry.     Findings: No rash.     Comments: Elderly female frail basilar crackles bilaterally no increased work of breathing alert and oriented x 3, bilateral venous ulcers stage II weeping, no purulent drainage, mildly tender,  Neurological:     General: No focal deficit present.     Mental Status: She is alert and oriented to person, place, and time. Mental status is at baseline.  Psychiatric:        Mood and Affect: Mood normal.        Behavior: Behavior normal.        Thought Content: Thought content normal.        Judgment: Judgment normal.     No results found.  Recent Results (from the past 2160 hours)  TSH     Status: Abnormal   Collection Time: 02/28/24 12:20 PM  Result Value Ref Range   TSH 13.600 (H) 0.450 - 4.500 uIU/mL  Basic metabolic panel with GFR     Status: Abnormal   Collection Time: 02/28/24 12:20 PM  Result Value Ref Range   Glucose 95 70 - 99 mg/dL   BUN 30 10 - 36 mg/dL   Creatinine, Ser 7.56 (H) 0.57 - 1.00 mg/dL   eGFR 17 (L) >40 fO/fpw/8.26   BUN/Creatinine Ratio 12 12 - 28   Sodium 142 134 - 144 mmol/L   Potassium 5.2 3.5 - 5.2 mmol/L   Chloride 106 96 - 106 mmol/L   CO2 19 (L) 20 - 29 mmol/L   Calcium 9.3 8.7 - 10.3 mg/dL  TSH     Status: Abnormal  Collection Time: 04/27/24  2:17 PM  Result Value Ref Range   TSH 8.32 (H) 0.35 - 5.50 uIU/mL  T4, free     Status: None   Collection Time: 04/27/24  2:17 PM  Result Value Ref Range   Free T4 0.91 0.60 - 1.60 ng/dL    Comment: Specimens from patients who are undergoing biotin therapy and /or ingesting biotin supplements may contain high levels of biotin.  The  higher biotin concentration in these specimens interferes with this Free T4 assay.  Specimens that contain high levels  of biotin may cause false high results for this Free T4 assay.  Please interpret results in light of the total clinical presentation of the patient.    Basic metabolic panel with GFR     Status: Abnormal   Collection Time: 04/27/24  2:17 PM  Result Value Ref Range   Sodium 144 135 - 145 mEq/L   Potassium 3.8 3.5 - 5.1 mEq/L   Chloride 110 96 - 112 mEq/L   CO2 18 (L) 19 - 32 mEq/L    Comment: Elevated LDH levels may cause falsely increased CO2 results. If LDH is >2000 U/L, a positive bias of 12% is possible.   Glucose, Bld 88 70 - 99 mg/dL   BUN 42 (H) 6 - 23 mg/dL   Creatinine, Ser 5.97 (H) 0.40 - 1.20 mg/dL   GFR 1.33 (LL) >39.99 mL/min    Comment: Calculated using the CKD-EPI Creatinine Equation (2021)   Calcium 8.8 8.4 - 10.5 mg/dL        Stacy KATHEE Hummer, MD  I,Emily Lagle,acting as a scribe for Stacy KATHEE Hummer, MD.,have documented all relevant documentation on the behalf of Stacy KATHEE Hummer, MD.  Stacy Huang Stacy KATHEE Hummer, MD, have reviewed all documentation for this visit. The documentation on 05/13/2024 for the exam, diagnosis, procedures, and orders are all accurate and complete.

## 2024-05-15 ENCOUNTER — Telehealth: Payer: Self-pay | Admitting: Family Medicine

## 2024-05-15 NOTE — Telephone Encounter (Signed)
 Copied from CRM [id number]. Topic: Clinical - Medical Advice >> May 15, 2024  1:46 PM Ashley R wrote: Reason for CRM: Landry, [facility] nurse calling after first visit with patient post discharge  Any new concerns about the patient? Yes - BP 175/60, Oxygen 92%, some difficulty with breathing but pt son states this is not new   Would like to know if patient has any limits on fluid intake or if she should be pushing for better hydration. And a few other questions. Callback  [phone] Beth  ----------------------------------------------------------------------- From previous Reason for Contact - Home Health Verbal Orders: Caller/Agency:  Callback Number:  Service Requested:

## 2024-05-17 DIAGNOSIS — J129 Viral pneumonia, unspecified: Secondary | ICD-10-CM | POA: Insufficient documentation

## 2024-05-17 DIAGNOSIS — I5033 Acute on chronic diastolic (congestive) heart failure: Secondary | ICD-10-CM | POA: Insufficient documentation

## 2024-05-17 DIAGNOSIS — I87319 Chronic venous hypertension (idiopathic) with ulcer of unspecified lower extremity: Secondary | ICD-10-CM | POA: Insufficient documentation

## 2024-05-17 DIAGNOSIS — I5032 Chronic diastolic (congestive) heart failure: Secondary | ICD-10-CM | POA: Insufficient documentation

## 2024-05-18 NOTE — Telephone Encounter (Signed)
 Landry, [facility] nurse calling after first visit with patient post discharge   Any new concerns about the patient? Yes - BP 175/60, Oxygen 92%, some difficulty with breathing but pt son states this is not new    Would like to know if patient has any limits on fluid intake or if she should be pushing for better hydration. And a few other questions. Please Advise

## 2024-05-19 ENCOUNTER — Other Ambulatory Visit: Payer: Self-pay

## 2024-05-19 ENCOUNTER — Other Ambulatory Visit: Payer: Self-pay | Admitting: Family Medicine

## 2024-05-19 ENCOUNTER — Telehealth: Payer: Self-pay | Admitting: Pharmacy Technician

## 2024-05-19 DIAGNOSIS — I1A0 Resistant hypertension: Secondary | ICD-10-CM

## 2024-05-19 MED ORDER — FUROSEMIDE 40 MG PO TABS: 40.0000 mg | ORAL_TABLET | Freq: Every day | ORAL | 0 refills | Status: DC

## 2024-05-19 MED ORDER — FUROSEMIDE 40 MG PO TABS: 40.0000 mg | ORAL_TABLET | Freq: Every day | ORAL | 0 refills | Status: AC

## 2024-05-19 NOTE — Progress Notes (Signed)
   05/19/2024  Patient ID: Stacy Huang, female   DOB: 1920-08-03, 89 y.o.   MRN: [id number]  Patient appeared on insurance report for being at risk of failing adherence measure for losartan  50mg  daily; however, this medication was held at recent discharge from hospital based on kidney function.  Patient was prescribed hydralazine  50mg  TID and instructed to continue metoprolol  xl 25mg  daily and amlodipine  10mg  daily.  She was also prescribed furosemide  40mg  daily x 30 days for HF/edema.  Contacted patient's home health nurse, Luke, to verify what medications the patient has on hand and is currently taking.  She has STOPPED the losartan  50mg  daily as directed and STARTED hydralazine  50mg  TID.  She continues to also take amlodipine  10mg  daily, metoprolol  xl 25mg  daily, and levothyroxine  75mcg daily.  She DOES NOT have furosemide  40mg , though; and it appears this prescription was sent to [facility] versus patient's preferred pharmacy, [facility].  HH Nurse does endorse fluid retention, some SOB, and BP fluctuating between 140/80-175/60 at home.  I recommend patient begin taking furosemide  40mg  daily x 30 days as prescribed at hospital discharge- order pending for Dr. Sebastian to send to [facility] if in agreement; and I have asked [facility] to cancel the order sent to them in error.  Patient has lab work ordered but no upcoming follow-up with Dr. Sebastian.  I would recommend a follow-up visit in the next week or two to follow-up on renal function, BP, fluid status, and leg wound.  Stacy Huang, PharmD, DPLA

## 2024-05-19 NOTE — Telephone Encounter (Signed)
 VO needed for skilled nursing/wound care 1 week x4  Unna boot in place at time of visit VO given to clean, wrap with Xeroform, and gauze daily If edema, HH nurse states pt may need an ABI

## 2024-05-19 NOTE — Progress Notes (Signed)
   05/19/2024  Patient ID: Stacy Huang, female   DOB: August 30, 1920, 89 y.o.   MRN: [id number]  Pharmacy Quality Measure Review  This patient is appearing on a report for being at risk of failing the adherence measure for hypertension (ACEi/ARB) medications this calendar year.   Medication: Losartan  Last fill date: 02/06/2024 for 90 day supply. Sold date was 02/15/24.  Will collaborate with embedded pharmacist to determine if patient is still supposed to be taking the medication as it was held after her discharge from the hospital on 05/08/24.   Tamecia Mcdougald, CPhT [facility] Office: [phone] Email: [email or web address]

## 2024-05-20 NOTE — Telephone Encounter (Signed)
 ATC beth to relay VO. Beth was unavail. Unable to LVMTCB due to no vm set up

## 2024-05-21 NOTE — Telephone Encounter (Signed)
 Verbal orders given to [facility] nurse

## 2024-05-27 ENCOUNTER — Inpatient Hospital Stay: Admitting: Family Medicine

## 2024-05-27 NOTE — Progress Notes (Incomplete)
 Assessment & Plan   Assessment/Plan:    Problem List Items Addressed This Visit   None       Assessment and Plan               There are no discontinued medications.  No follow-ups on file.        Subjective:   Encounter date: 05/27/2024  Stacy Huang is a 89 y.o. female who has Other fatigue; Stage 3b chronic kidney disease (HCC); History of deep vein thrombosis (DVT) of lower extremity; Hypothyroidism; Hyperlipidemia; Compression fracture of L1 vertebra (HCC); Dizziness and giddiness; Hearing loss of both ears due to cerumen impaction; Resistant hypertension; Orthostatic dizziness; Vertigo; Frail elderly; Difficulty transferring from wheelchair to chair; Glaucoma of right eye; Nocturnal leg cramps; Stage 4 chronic kidney disease (HCC); Chronic nonintractable headache; Postmenopausal estrogen deficiency; Chronic pain of right knee; Drug-induced constipation; Sarcopenia; Personal history of fall; Acute on chronic heart failure with preserved ejection fraction (HFpEF) (HCC); Viral pneumonia; and Venous ulcer of lower extremity due to chronic peripheral venous hypertension (HCC) on their problem list..   She  has a past medical history of Acute hypoxemic respiratory failure due to COVID-19 [facility]) (02/27/2020), Essential hypertension (04/21/2021), Hypertension, Hypertensive urgency (02/27/2020), Shoulder fracture, right, and Thyroid  disease..   She presents with chief complaint of No chief complaint on file. .   Discussed the use of AI scribe software for clinical note transcription with the patient, who gave verbal consent to proceed.  History of Present Illness          Venous Stasis Ulcers  - Noted at last visit, 05/15/2024, on bilateral lower legs, with possible infection indicated by warmth and worsening pain.  - Prescribed Ceftin  250 mg daily for 10 days to cover potential infection.  ROS  No past surgical history on file.  Medications Ordered Prior to  Encounter[1]  Family History  Problem Relation Age of Onset   Alzheimer's disease Mother    Hypertension Father    Other Sister        Died at birth   Cancer Brother        spinal   Heart attack Brother     Social History   Socioeconomic History   Marital status: Widowed    Spouse name: Not on file   Number of children: 1   Years of education: Not on file   Highest education level: Not on file  Occupational History   Not on file  Tobacco Use   Smoking status: Never    Passive exposure: Never   Smokeless tobacco: Never  Vaping Use   Vaping status: Never Used  Substance and Sexual Activity   Alcohol use: Never   Drug use: Never   Sexual activity: Not on file  Other Topics Concern   Not on file  Social History Narrative   Not on file   Social Drivers of Health   Tobacco Use: Low Risk (05/13/2024)   Patient History    Smoking Tobacco Use: Never    Smokeless Tobacco Use: Never    Passive Exposure: Never  Financial Resource Strain: Low Risk (10/19/2022)   Overall Financial Resource Strain (CARDIA)    Difficulty of Paying Living Expenses: Not hard at all  Food Insecurity: No Food Insecurity (10/19/2022)   Hunger Vital Sign    Worried About Running Out of Food in the Last Year: Never true    Ran Out of Food in the Last Year: Never true  Transportation Needs: No  Transportation Needs (10/19/2022)   PRAPARE - Administrator, Civil Service (Medical): No    Lack of Transportation (Non-Medical): No  Physical Activity: Inactive (10/19/2022)   Exercise Vital Sign    Days of Exercise per Week: 0 days    Minutes of Exercise per Session: 0 min  Stress: No Stress Concern Present (10/19/2022)   [employer] of Occupational Health - Occupational Stress Questionnaire    Feeling of Stress : Not at all  Social Connections: Moderately Isolated (08/07/2022)   Social Connection and Isolation Panel    Frequency of Communication with Friends and Family: Twice a week     Frequency of Social Gatherings with Friends and Family: Once a week    Attends Religious Services: 1 to 4 times per year    Active Member of [employer] or Organizations: No    Attends Banker Meetings: Never    Marital Status: Widowed  Intimate Partner Violence: Not At Risk (08/07/2022)   Humiliation, Afraid, Rape, and Kick questionnaire    Fear of Current or Ex-Partner: No    Emotionally Abused: No    Physically Abused: No    Sexually Abused: No  Depression (PHQ2-9): Medium Risk (12/05/2023)   Depression (PHQ2-9)    PHQ-2 Score: 5  Alcohol Screen: Not on file  Housing: Low Risk (10/19/2022)   Housing    Last Housing Risk Score: 0  Utilities: Not At Risk (10/19/2022)   AHC Utilities    Threatened with loss of utilities: No  Health Literacy: Not on file                                                                                                  Objective:  Physical Exam: There were no vitals taken for this visit.   Physical Exam           Physical Exam  No results found.  Recent Results (from the past 2160 hours)  TSH     Status: Abnormal   Collection Time: 02/28/24 12:20 PM  Result Value Ref Range   TSH 13.600 (H) 0.450 - 4.500 uIU/mL  Basic metabolic panel with GFR     Status: Abnormal   Collection Time: 02/28/24 12:20 PM  Result Value Ref Range   Glucose 95 70 - 99 mg/dL   BUN 30 10 - 36 mg/dL   Creatinine, Ser 7.56 (H) 0.57 - 1.00 mg/dL   eGFR 17 (L) >40 fO/fpw/8.26   BUN/Creatinine Ratio 12 12 - 28   Sodium 142 134 - 144 mmol/L   Potassium 5.2 3.5 - 5.2 mmol/L   Chloride 106 96 - 106 mmol/L   CO2 19 (L) 20 - 29 mmol/L   Calcium 9.3 8.7 - 10.3 mg/dL  TSH     Status: Abnormal   Collection Time: 04/27/24  2:17 PM  Result Value Ref Range   TSH 8.32 (H) 0.35 - 5.50 uIU/mL  T4, free     Status: None   Collection Time: 04/27/24  2:17 PM  Result Value Ref Range   Free T4 0.91 0.60 - 1.60 ng/dL  Comment: Specimens from patients who are undergoing  biotin therapy and /or ingesting biotin supplements may contain high levels of biotin.  The higher biotin concentration in these specimens interferes with this Free T4 assay.  Specimens that contain high levels  of biotin may cause false high results for this Free T4 assay.  Please interpret results in light of the total clinical presentation of the patient.    Basic metabolic panel with GFR     Status: Abnormal   Collection Time: 04/27/24  2:17 PM  Result Value Ref Range   Sodium 144 135 - 145 mEq/L   Potassium 3.8 3.5 - 5.1 mEq/L   Chloride 110 96 - 112 mEq/L   CO2 18 (L) 19 - 32 mEq/L    Comment: Elevated LDH levels may cause falsely increased CO2 results. If LDH is >2000 U/L, a positive bias of 12% is possible.   Glucose, Bld 88 70 - 99 mg/dL   BUN 42 (H) 6 - 23 mg/dL   Creatinine, Ser 5.97 (H) 0.40 - 1.20 mg/dL   GFR 1.33 (LL) >39.99 mL/min    Comment: Calculated using the CKD-EPI Creatinine Equation (2021)   Calcium 8.8 8.4 - 10.5 mg/dL        Beverley KATHEE Hummer, MD  I,Emily Lagle,acting as a scribe for Beverley KATHEE Hummer, MD.,have documented all relevant documentation on the behalf of Beverley KATHEE Hummer, MD.  Stacy Huang Beverley KATHEE Hummer, MD, have reviewed all documentation for this visit. The documentation on 05/27/2024 for the exam, diagnosis, procedures, and orders are all accurate and complete.    [1]  Current Outpatient Medications on File Prior to Visit  Medication Sig Dispense Refill   amLODipine  (NORVASC ) 10 MG tablet TAKE 1 TABLET(10 MG) BY MOUTH EVERY MORNING 90 tablet 2   benzonatate  (TESSALON ) 100 MG capsule Take 1 capsule (100 mg total) by mouth 2 (two) times daily as needed for cough. 20 capsule 0   dextromethorphan-guaiFENesin  (MUCINEX  DM) 30-600 MG 12hr tablet Take 1 tablet by mouth 2 (two) times daily. (Patient not taking: Reported on 05/19/2024)     furosemide  (LASIX ) 40 MG tablet Take 1 tablet (40 mg total) by mouth daily. 30 tablet 0   hydrALAZINE  (APRESOLINE ) 50 MG  tablet Take 50 mg by mouth 3 (three) times daily.     levothyroxine  (SYNTHROID ) 75 MCG tablet Take 1 tablet (75 mcg total) by mouth daily. 90 tablet 3   meclizine  (ANTIVERT ) 12.5 MG tablet Take 12.5 mg by mouth 3 (three) times daily as needed.     metoprolol  succinate (TOPROL  XL) 25 MG 24 hr tablet Take 1 tablet (25 mg total) by mouth daily. 90 tablet 2   polyethylene glycol powder (GLYCOLAX /MIRALAX ) 17 GM/SCOOP powder Take 17 g by mouth daily. 500 g 0   No current facility-administered medications on file prior to visit.

## 2024-05-29 ENCOUNTER — Ambulatory Visit: Admitting: Family Medicine

## 2024-06-02 ENCOUNTER — Telehealth: Payer: Self-pay

## 2024-06-02 NOTE — Telephone Encounter (Signed)
 Copied from CRM #[id number]. Topic: General - Other >> Jun 02, 2024  9:28 AM Aleatha BROCKS wrote: Reason for CRM: Nurse from [facility] home [facility]  call to inform Patient PC that she has  swollen in both legs and weeping in left, needs a referral to Vascular for compressing , blood pressure has been consistently been 170 on stastolic and So is looking to get her Nursing Facility  Call back [phone]

## 2024-06-02 NOTE — Telephone Encounter (Signed)
 Dr. Sebastian placed a referral for wound care for the venous stasis ulcers on December 3 at her office visit appointment.  If she is having worsening weeping in her legs that the nurse suspects further fluid overload, then she needs to be taken to the hospital for management.

## 2024-06-02 NOTE — Telephone Encounter (Signed)
 Please see message below  LOV: 05/13/2024 FOV: 07/11/2023

## 2024-06-03 NOTE — Telephone Encounter (Signed)
 Contacted patient son (verified per DPR) to confirm if his mother has been getting her wound care through the home health nurse and he stated she does. He stated they come on Saturday and Sunday and he mentioned the ulcers hare healing.

## 2024-06-23 ENCOUNTER — Other Ambulatory Visit: Payer: Self-pay | Admitting: Family Medicine

## 2024-06-23 DIAGNOSIS — J129 Viral pneumonia, unspecified: Secondary | ICD-10-CM

## 2024-06-26 ENCOUNTER — Telehealth: Payer: Self-pay | Admitting: Family Medicine

## 2024-06-26 NOTE — Telephone Encounter (Signed)
 CLINICAL USE BELOW THIS LINE (use X to signify taken)  __x__Form received and placed in providers office for signature. ____Form completed and faxed to LOA Dept. ____Form completed & LVM to notify pt ready for pick up ____Charge sheet & copy of form in front office folder for office supervisor.

## 2024-06-26 NOTE — Telephone Encounter (Signed)
 Patient dropped off document Home Health Certificate (Order ID [id number]), to be filled out by provider. Patient requested to send it back via Fax within 7-days. Document is located in providers tray at front office.Please advise at [phone]   Home health cert came through fax. I put in the dr box

## 2024-06-30 ENCOUNTER — Telehealth: Payer: Self-pay | Admitting: Family Medicine

## 2024-06-30 NOTE — Telephone Encounter (Signed)
"   CLINICAL USE BELOW THIS LINE (use X to signify taken)  ____Form received and placed in providers office for signature. __x__Form completed and faxed to LOA Dept. ____Form completed & LVM to notify pt ready for pick up __x__Charge sheet & copy of form in front office folder for office supervisor.   "

## 2024-06-30 NOTE — Telephone Encounter (Signed)
 CLINICAL USE BELOW THIS LINE (use X to signify taken)  __x__Form received and placed in providers office for signature. ____Form completed and faxed to LOA Dept. ____Form completed & LVM to notify pt ready for pick up ____Charge sheet & copy of form in front office folder for office supervisor.

## 2024-06-30 NOTE — Telephone Encounter (Signed)
 Patient dropped off document Home Health Certificate (Order ID [id number]), to be filled out by provider. Patient requested to send it back via Fax within 7-days. Document is located in providers tray at front office.Please advise at [phone]  Home health cert. Order came through fax . I put in the dr box

## 2024-06-30 NOTE — Telephone Encounter (Signed)
 Paperwork received has been faxed back, only had to add the date

## 2024-07-10 ENCOUNTER — Ambulatory Visit: Admitting: Family Medicine

## 2024-07-10 VITALS — BP 164/78 | HR 67 | Temp 97.4°F | Ht 63.0 in

## 2024-07-10 DIAGNOSIS — R54 Age-related physical debility: Secondary | ICD-10-CM

## 2024-07-10 DIAGNOSIS — N185 Chronic kidney disease, stage 5: Secondary | ICD-10-CM | POA: Insufficient documentation

## 2024-07-10 DIAGNOSIS — W19XXXA Unspecified fall, initial encounter: Secondary | ICD-10-CM

## 2024-07-10 DIAGNOSIS — I87319 Chronic venous hypertension (idiopathic) with ulcer of unspecified lower extremity: Secondary | ICD-10-CM

## 2024-07-10 DIAGNOSIS — Z7409 Other reduced mobility: Secondary | ICD-10-CM

## 2024-07-10 DIAGNOSIS — S62102A Fracture of unspecified carpal bone, left wrist, initial encounter for closed fracture: Secondary | ICD-10-CM

## 2024-07-10 DIAGNOSIS — I5032 Chronic diastolic (congestive) heart failure: Secondary | ICD-10-CM

## 2024-07-10 DIAGNOSIS — J129 Viral pneumonia, unspecified: Secondary | ICD-10-CM

## 2024-07-10 DIAGNOSIS — I1A0 Resistant hypertension: Secondary | ICD-10-CM

## 2024-07-10 DIAGNOSIS — E039 Hypothyroidism, unspecified: Secondary | ICD-10-CM

## 2024-07-10 MED ORDER — IPRATROPIUM-ALBUTEROL 0.5-2.5 (3) MG/3ML IN SOLN
3.0000 mL | Freq: Once | RESPIRATORY_TRACT | Status: AC
  Administered 2024-07-10: 3 mL via RESPIRATORY_TRACT

## 2024-07-10 MED ORDER — IPRATROPIUM-ALBUTEROL 0.5-2.5 (3) MG/3ML IN SOLN: 3.0000 mL | Freq: Four times a day (QID) | RESPIRATORY_TRACT | 1 refills | Status: AC | PRN

## 2024-07-10 NOTE — Patient Instructions (Addendum)
 It was very nice to see you today!  VISIT SUMMARY: During your visit, we addressed multiple health concerns including your chronic venous ulcer, chronic kidney disease, heart failure, hypertension, hypothyroidism, recent fall, and respiratory symptoms following viral pneumonia. We also discussed your frailty and the challenges your caregiver faces.  YOUR PLAN: VENOUS ULCER OF RIGHT LOWER EXTREMITY: You have a chronic venous ulcer on your right lower leg that is related to venous stasis disease and chronic peripheral hypertension. The ulcer appears improved but still requires management. -Continue home health wound management. -Referred to vascular surgery and wound clinic for further management. -Wrapped the right lower leg and instructed home health to continue wrapping. -Ordered BNP and CRP to assess for underlying infection. -Ordered complete metabolic panel, electrolytes, magnesium, parathyroid hormone, and phosphorus levels. -Ordered TSH with reflex on normal T4. -Ordered vitamin D  25-hydroxy level.  STAGE 5 CHRONIC KIDNEY DISEASE: You have stage 5 chronic kidney disease and are not on dialysis. Monitoring is required due to potential complications. -Ordered complete metabolic panel, electrolytes, magnesium, parathyroid hormone, and phosphorus levels. -Ordered vitamin D  25-hydroxy level.  CHRONIC HEART FAILURE WITH PRESERVED EJECTION FRACTION: You have chronic heart failure with preserved ejection fraction. Monitoring is required to assess your current status. -Continue hydralazine  and metoprolol . -Ordered BNP for baseline assessment.  RESISTANT HYPERTENSION: You have resistant hypertension. Your current management includes amlodipine , metoprolol , and hydralazine . -Continue amlodipine , metoprolol , and hydralazine .  ACQUIRED HYPOTHYROIDISM: You have hypothyroidism and are taking levothyroxine  75 mcg daily. Monitoring is required to ensure adequate control. -Ordered TSH with reflex on  normal T4.  CLOSED FRACTURE OF LEFT WRIST: You have a suspected closed fracture of your left wrist following a fall. Swelling and pain are present, indicating a possible fracture. -Ordered x-ray of left wrist, elbow, and shoulder. -Referred to orthopedics for further evaluation and management.  VIRAL PNEUMONIA: You had a recent viral pneumonia with ongoing cough and wheezing. Monitoring is required to ensure resolution. -Ordered chest x-ray to assess resolution of pneumonia. -Administered DuoNeb nebulizer treatment. -Will consider setting up home nebulizer treatment.  FRAILTY AND REDUCED MOBILITY: You have frailty and reduced mobility due to age and multiple health issues. We discussed the possibility of transitioning to nursing home care due to caregiver stress and difficulty with transfers. -Referred to geriatrics for evaluation and management of frailty and transition of care. -Referred to [facility] care management and social work team for caregiver support and community resources.  FALL WITH EVALUATION FOR INJURY: You had a recent fall with potential injuries  including a left wrist fracture and chest wall pain. Monitoring is required to assess for fractures and other injuries. -Ordered x-ray of left wrist, elbow, shoulder, and chest. -Ordered CT scan of head to rule out brain injury. -Ordered x-ray of lumbar spine to assess for back pain.  Return in about 1 year (around 07/10/2025) for breathing, leg wound, fall.   Take care, Arvella Hummer, MD, MS   PLEASE NOTE:  If you had any lab tests, please let us  know if you have not heard back within a few days. You may see your results on mychart before we have a chance to review them but we will give you a call once they are reviewed by us .   If we ordered any referrals today, please let us  know if you have not heard from their office within the next week.   If you had any urgent prescriptions sent in today, please check with the pharmacy  within an hour of  our visit to make sure the prescription was transmitted appropriately.   Please try these tips to maintain a healthy lifestyle:  Eat at least 3 REAL meals and 1-2 snacks per day.  Aim for no more than 5 hours between eating.  If you eat breakfast, please do so within one hour of getting up.   Each meal should contain half fruits/vegetables, one quarter protein, and one quarter carbs (no bigger than a computer mouse)  Cut down on sweet beverages. This includes juice, soda, and sweet tea.   Drink at least 1 glass of water with each meal and aim for at least 8 glasses per day  Exercise at least 150 minutes every week.

## 2024-07-11 LAB — TSH RFX ON ABNORMAL TO FREE T4: TSH: 106 u[IU]/mL — ABNORMAL HIGH (ref 0.450–4.500)

## 2024-07-11 LAB — T4F: T4,Free (Direct): 0.92 ng/dL (ref 0.82–1.77)

## 2024-07-13 ENCOUNTER — Ambulatory Visit: Payer: Self-pay | Admitting: Family Medicine

## 2024-07-13 ENCOUNTER — Telehealth: Payer: Self-pay

## 2024-07-13 DIAGNOSIS — E034 Atrophy of thyroid (acquired): Secondary | ICD-10-CM

## 2024-07-13 MED ORDER — LEVOTHYROXINE SODIUM 88 MCG PO TABS: 88.0000 ug | ORAL_TABLET | Freq: Every day | ORAL | 3 refills | Status: AC

## 2024-07-13 NOTE — Progress Notes (Signed)
 Complex Care Management Note  Care Guide Note 07/13/2024 Name: Stacy Huang MRN: [id number] DOB: 13-Dec-1920  Stacy Huang is a 89 y.o. year old female who sees Sebastian Beverley NOVAK, MD for primary care. I reached out to Stacy Huang by phone today to offer complex care management services.  Stacy Huang was given information about Complex Care Management services today including:   The Complex Care Management services include support from the care team which includes your Nurse Care Manager, Clinical Social Worker, or Pharmacist.  The Complex Care Management team is here to help remove barriers to the health concerns and goals most important to you. Complex Care Management services are voluntary, and the patient may decline or stop services at any time by request to their care team member.   Complex Care Management Consent Status: Patient agreed to services and verbal consent obtained.   Follow up plan:  Telephone appointment with complex care management team member scheduled for:  07/29/24 at 1:30 p.m.   Encounter Outcome:  Patient Scheduled  Dream[facility]  [facility] VBCI Assistant Direct Dial: [phone]  Fax: [phone]

## 2024-07-14 ENCOUNTER — Other Ambulatory Visit (HOSPITAL_BASED_OUTPATIENT_CLINIC_OR_DEPARTMENT_OTHER)

## 2024-07-14 ENCOUNTER — Ambulatory Visit (HOSPITAL_BASED_OUTPATIENT_CLINIC_OR_DEPARTMENT_OTHER)

## 2024-07-20 ENCOUNTER — Encounter: Admitting: Orthopedic Surgery

## 2024-07-29 ENCOUNTER — Telehealth: Admitting: Licensed Clinical Social Worker

## 2024-08-12 ENCOUNTER — Ambulatory Visit: Admitting: Family Medicine

## 2024-08-25 ENCOUNTER — Other Ambulatory Visit
